# Patient Record
Sex: Female | Born: 1970 | Race: White | Hispanic: No | Marital: Married | Smoking: Current every day smoker
Health system: Southern US, Community
[De-identification: ages and names within clinical notes are randomized; demographics above are authoritative.]

## PROBLEM LIST (undated history)

## (undated) DIAGNOSIS — T7840XA Allergy, unspecified, initial encounter: Secondary | ICD-10-CM

## (undated) DIAGNOSIS — K8020 Calculus of gallbladder without cholecystitis without obstruction: Secondary | ICD-10-CM

## (undated) DIAGNOSIS — K219 Gastro-esophageal reflux disease without esophagitis: Secondary | ICD-10-CM

## (undated) DIAGNOSIS — F419 Anxiety disorder, unspecified: Secondary | ICD-10-CM

## (undated) DIAGNOSIS — F329 Major depressive disorder, single episode, unspecified: Secondary | ICD-10-CM

## (undated) DIAGNOSIS — K589 Irritable bowel syndrome without diarrhea: Secondary | ICD-10-CM

## (undated) DIAGNOSIS — C801 Malignant (primary) neoplasm, unspecified: Secondary | ICD-10-CM

## (undated) DIAGNOSIS — G35 Multiple sclerosis: Secondary | ICD-10-CM

## (undated) DIAGNOSIS — F32A Depression, unspecified: Secondary | ICD-10-CM

## (undated) DIAGNOSIS — G43909 Migraine, unspecified, not intractable, without status migrainosus: Secondary | ICD-10-CM

## (undated) HISTORY — PX: TUBAL LIGATION: SHX77

## (undated) HISTORY — DX: Major depressive disorder, single episode, unspecified: F32.9

## (undated) HISTORY — PX: BREAST BIOPSY: SHX20

## (undated) HISTORY — DX: Depression, unspecified: F32.A

## (undated) HISTORY — DX: Gastro-esophageal reflux disease without esophagitis: K21.9

## (undated) HISTORY — DX: Anxiety disorder, unspecified: F41.9

## (undated) HISTORY — DX: Allergy, unspecified, initial encounter: T78.40XA

## (undated) HISTORY — DX: Migraine, unspecified, not intractable, without status migrainosus: G43.909

## (undated) HISTORY — DX: Irritable bowel syndrome, unspecified: K58.9

## (undated) HISTORY — DX: Malignant (primary) neoplasm, unspecified: C80.1

## (undated) HISTORY — PX: NOVASURE ABLATION: SHX5394

---

## 2000-01-01 ENCOUNTER — Other Ambulatory Visit: Admission: RE | Admit: 2000-01-01 | Discharge: 2000-01-01 | Payer: Self-pay | Admitting: Obstetrics & Gynecology

## 2000-05-14 ENCOUNTER — Encounter: Admission: RE | Admit: 2000-05-14 | Discharge: 2000-08-12 | Payer: Self-pay | Admitting: Obstetrics and Gynecology

## 2000-07-15 ENCOUNTER — Inpatient Hospital Stay (HOSPITAL_COMMUNITY): Admission: AD | Admit: 2000-07-15 | Discharge: 2000-07-17 | Payer: Self-pay | Admitting: Obstetrics & Gynecology

## 2000-08-14 ENCOUNTER — Other Ambulatory Visit: Admission: RE | Admit: 2000-08-14 | Discharge: 2000-08-14 | Payer: Self-pay | Admitting: Obstetrics & Gynecology

## 2000-08-19 ENCOUNTER — Ambulatory Visit (HOSPITAL_COMMUNITY): Admission: RE | Admit: 2000-08-19 | Discharge: 2000-08-19 | Payer: Self-pay | Admitting: Obstetrics & Gynecology

## 2002-06-26 ENCOUNTER — Encounter: Admission: RE | Admit: 2002-06-26 | Discharge: 2002-06-26 | Payer: Self-pay

## 2002-09-11 ENCOUNTER — Other Ambulatory Visit: Admission: RE | Admit: 2002-09-11 | Discharge: 2002-09-11 | Payer: Self-pay | Admitting: Obstetrics & Gynecology

## 2003-11-03 ENCOUNTER — Other Ambulatory Visit: Admission: RE | Admit: 2003-11-03 | Discharge: 2003-11-03 | Payer: Self-pay | Admitting: Obstetrics & Gynecology

## 2004-03-08 ENCOUNTER — Encounter: Admission: RE | Admit: 2004-03-08 | Discharge: 2004-03-08 | Payer: Self-pay | Admitting: Obstetrics and Gynecology

## 2005-04-12 ENCOUNTER — Ambulatory Visit (HOSPITAL_COMMUNITY): Admission: RE | Admit: 2005-04-12 | Discharge: 2005-04-12 | Payer: Self-pay | Admitting: Obstetrics & Gynecology

## 2005-04-12 ENCOUNTER — Encounter (INDEPENDENT_AMBULATORY_CARE_PROVIDER_SITE_OTHER): Payer: Self-pay | Admitting: *Deleted

## 2005-10-16 ENCOUNTER — Ambulatory Visit (HOSPITAL_COMMUNITY): Admission: RE | Admit: 2005-10-16 | Discharge: 2005-10-16 | Payer: Self-pay | Admitting: Gastroenterology

## 2007-07-18 ENCOUNTER — Encounter: Admission: RE | Admit: 2007-07-18 | Discharge: 2007-07-18 | Payer: Self-pay | Admitting: Family Medicine

## 2011-02-16 ENCOUNTER — Other Ambulatory Visit: Payer: Self-pay

## 2011-02-16 ENCOUNTER — Ambulatory Visit
Admission: RE | Admit: 2011-02-16 | Discharge: 2011-02-16 | Disposition: A | Discharge status: Home / Self Care | Payer: 59 | Source: Ambulatory Visit | Attending: Family Medicine | Admitting: Family Medicine

## 2011-02-16 ENCOUNTER — Other Ambulatory Visit: Payer: Self-pay | Admitting: Family Medicine

## 2011-02-16 DIAGNOSIS — R202 Paresthesia of skin: Secondary | ICD-10-CM

## 2011-02-19 ENCOUNTER — Other Ambulatory Visit: Payer: Self-pay

## 2011-04-13 NOTE — H&P (Signed)
[facility]  Patient:    Amy Charles, Amy Charles                        MRN: [id number] Adm. Date:  [id number] Attending:  Silverio Lay A                         History and Physical  DATE OF BIRTH:                Oct 15, 1971.                                Amy Charles is a 40 year old woman, G3, P1, A1, last menstrual period October 13, 1999, for an estimated delivery date on July 21, 2000.  Ultrasound corresponded with last menstrual period dating at 39-weeks and 1-day gestation.  REASON FOR ADMISSION:         Induction for PIH.  HISTORY OF PRESENT ILLNESS:   Patient presented for her office visit today and had a blood pressure of 160/90.  It was repeated and stayed in the same range. She had mild headache, possibly associated with congestion.  No visual symptoms, no epigastric pain and mild lower limb edema.  Fetal movements were positive.  No vaginal bleeding.  No fluid leak.  Rare uterine contractions. Her blood pressures last week were 140/80 and then when rechecked, 125/65. Urine was negative for protein.  In her first pregnancy, she had mild PIH, did not need any medication and no magnesium sulfate was used.  PAST MEDICAL HISTORY:         Negative.  PAST SURGICAL HISTORY:        Negative.  PAST OBSTETRICAL HISTORY:     Twelve-week therapeutic abortion, no complication, 1989.  August 1998, 38-week induction for large for gestational age, spontaneous vaginal delivery; baby was 8 pounds and 9 ounces.  She had mild PIH.  No medication used.  No magnesium sulfate use.  No complications.  ALLERGIES:                    No known drug allergies.  MEDICATIONS:                  Prenate vitamins.  SOCIAL HISTORY:               Married.  Nonsmoker.  HISTORY OF PRESENT ILLNESS:   First trimester was normal.  She had hemoglobin of 13.4, platelets 250,000.  Blood type and Rh:  O-positive, antibodies negative.  RPR nonreactive.  HBsAg negative.   Rubella immune.  In her second trimester, she had a triple test, within normal limits.  Ultrasound at 20+ weeks showed normal review of anatomy, no change in dating, normal fluid, normal placenta.  Synechiae were present.  An ultrasound was repeated at 31+ weeks and showed that it was resolved.  Estimated fetal weight at that time was 73rd percentile, normal amniotic fluid.  She has a history of cryotherapy.  Her cervix was checked every visit and was slightly shorter but reassuring.  Her one-hour GTT was abnormal.  Her three-hour GTT was abnormal. She had gestational diabetes, type A-1, well-controlled.  Group B strep was done at 36 weeks and came back negative.  Blood pressure:  See HPI.  REVIEW OF SYSTEMS:            CONSTITUTIONAL:  Negative.  HEENT:  Mild congestion.  RESPIRATORY:  Negative.  CARDIOVASCULAR:  Negative.  GI: Negative.  UROLOGIC:  Negative.  NEUROLOGIC:  Mild headache, probably associated with congestion.  DERMATOLOGIC:  Negative.  PHYSICAL EXAMINATION  GENERAL:                      No apparent distress.  VITAL SIGNS:                  Blood pressure at the office was 160/90.  Blood pressure here came down to 133/70.  Pulse 94.  Respiratory rate 20. Temperature 99.9.  HEENT:                        Within normal limits.  LUNGS:                        Clear bilaterally.  HEART:                        S1 and S2 normal.  Regular cardiac rhythm.  No murmur.  ABDOMEN:                      Gravid.  Uterine height 38 cm.  Vertex presentation.  PELVIC:                       Vaginal exam:  Three-centimeters dilated, 70% effaced, vertex -1, membranes intact.  EXTREMITIES:                  Lower limbs:  Mild edema.  DTRs 2/4.  No clonus.  LABORATORY DATA:              Fasting blood sugar is below 80; postprandial below 90.  MONITORING:                   Fetal heart rate baseline 150 per minute, accelerations positive, no deceleration, reactive NST.  No regular  uterine contractions.  IMPRESSION:                   Gravida 3, para 1, abortus 1, 39-weeks and 1-day gestation with pregnancy-induced hypertension, no evidence of preeclampsia, fetal well-being reassuring, group B streptococcus negative.  PLAN:                         Admit to labor and delivery, induction with artificial rupture of membranes and low-dose Pitocin.  Follow for probable vaginal delivery.  Monitoring. DD:  07/15/00 TD:  07/15/00 Job: [id number]

## 2011-04-13 NOTE — Op Note (Signed)
[facility]  Patient:    Amy Charles, Amy Charles                        MRN: [id number] Proc. Date: 08/19/00 Adm. Date:  [id number] Disc. Date: [id number] Attending:  Silverio Lay A                           Operative Report  PREOPERATIVE DIAGNOSIS:       Desired tubal sterilization.  POSTOPERATIVE DIAGNOSIS:      Desired tubal sterilization.  INTERVENTION:                 Bilateral tubal sterilization with aliquot clips                               by laparoscopy.  SURGEON:                      Genia Del, M.D.  ANESTHESIOLOGIST:             Belva Agee, M.D.  PROCEDURE:                    Under general anesthesia with endotracheal intubation, the patient was placed in the lithotomy position.  She is prepped with Betadine at the abdominal suprapubic vulvar and vaginal area.  The bladder is emptied with a catheter, and the patient is draped as usual.  The vaginal examination revealed an anteverted uterus, normal volume.  No adnexal mass.  The cervix is normal.  The speculum is inserted and the uterus is cannulated, and then the speculum was removed.  Abdominally an infraumbilical incision is made with a scalpel over 10 mm.  The Veress needle is inserted while raising the abdominal wall.  The security tests are done and a pneumoperitoneum is created using 4 L of CO2.  Once achieved, the needle is removed.  The trocars are inserted with camera.  Inspection of the pelvic cavity reveals a normal uterus and volume in appearance, two normal ovaries and two normal tubes.  No lesion is seen in the pelvis.  The abdominal inspection is negative as well.  We used the Hulka clips at about 2 cm from the cornua on each side, and ______ clip is applied, clamping the old diameter of the tube on each side.  Verification of application is then done, and is adequate.  We took pictures before the application of Hulka clips and after.  Hemostasis is adequate.   The instruments were therefore removed.  The CO2 is evacuated. The ______ is closed at the infraumbilical incision with 0 Vicryl, and Marcaine 0.25%  is injected subcutaneously (4 cc).  The skin is then closed with 4-0 Vicryl.  The instruments vaginally are also removed.  ESTIMATED BLOOD LOSS:         Minimal.  COMPLICATIONS:                None.  DISPOSITION:                  The patient was transferred to the recovery room in good condition. DD:  08/19/00 TD:  08/19/00 Job: [id number]

## 2011-04-13 NOTE — Op Note (Signed)
Amy Charles, Amy Charles                 ACCOUNT NO.:  [id number]   MEDICAL RECORD NO.:  [id number]          PATIENT TYPE:  AMB   LOCATION:  SDC                           FACILITY:  [facility]   PHYSICIAN:  Genia Del, M.D.DATE OF BIRTH:  Jul 10, 1971   DATE OF PROCEDURE:  04/12/2005  DATE OF DISCHARGE:                                 OPERATIVE REPORT   PREOPERATIVE DIAGNOSIS:  Menometrorrhagia.   POSTOPERATIVE DIAGNOSIS:  Menometrorrhagia.   PROCEDURES:  1.  Dilatation and curettage.  2.  NovaSure endometrial ablation.   SURGEON:  Genia Del, M.D.   ANESTHESIOLOGIST:  Octaviano Glow. Pamalee Leyden, M.D.   PROCEDURE:  Under MAC analgesia, the patient is in lithotomy position.  She  is prepped with Betadine on the suprapubic, vulvar and vaginal areas, the  bladder is catheterized and the patient is draped as usual.  The vaginal  exam reveals an anteverted uterus, normal volume, no adnexal mass.  The  cervix is long and closed.  The patient is on day 2 of her menses with  moderate flow.  The speculum is inserted in the vagina.  The anterior lip of  the cervix is grasped with a tenaculum.  We start by doing a hysterometry,  which reveals an intrauterine cavity at 7 cm, the length of the cervix is 3  cm, so the uterine cavity only is 4 cm.  Note that the paracervical block  was done with Nesacaine 1% 20 mL at 4 and 8 o'clock.  We then proceed with  dilatation of the cervix to Hegar dilator #17 easily.  We proceed with a  systematic endometrial curettage because the patient is menstruating.  The  endometrial curettings are sent to pathology.  A sharp curette was used for  that.  We then take the NovaSure instrument.  We insert it in the  intrauterine cavity, proceed with a width measurement, which reveals the  width of the cavity at 3.7 cm.  We then verify the integrity of the  intrauterine cavity.  The test is passed, confirmed by a green light.  We  then proceed with the endometrial ablation,  which lasts 1 minute 17 seconds  at a power of 81.  The instrument is then removed.  The tenaculum is also  removed as well as the speculum after confirming good hemostasis.  The  estimated blood loss was minimal, no complication occurred.  The patient  tolerated the procedure very well, and she was brought to recovery room in  good, stable status.      ML/MEDQ  D:  04/12/2005  T:  04/12/2005  Job:  [id number]

## 2012-03-13 ENCOUNTER — Encounter: Payer: Self-pay | Admitting: Neurology

## 2012-03-13 ENCOUNTER — Ambulatory Visit (INDEPENDENT_AMBULATORY_CARE_PROVIDER_SITE_OTHER): Payer: 59 | Admitting: Neurology

## 2012-03-13 ENCOUNTER — Other Ambulatory Visit: Payer: Self-pay | Admitting: Neurology

## 2012-03-13 ENCOUNTER — Other Ambulatory Visit (INDEPENDENT_AMBULATORY_CARE_PROVIDER_SITE_OTHER): Payer: 59

## 2012-03-13 VITALS — BP 122/72 | HR 104 | Wt 173.0 lb

## 2012-03-13 DIAGNOSIS — G609 Hereditary and idiopathic neuropathy, unspecified: Secondary | ICD-10-CM

## 2012-03-13 LAB — COMPREHENSIVE METABOLIC PANEL
Alkaline Phosphatase: 57 U/L (ref 39–117)
BUN: 12 mg/dL (ref 6–23)
Creatinine, Ser: 0.6 mg/dL (ref 0.4–1.2)
GFR: 113.19 mL/min (ref >60.00)
Glucose, Bld: 102 mg/dL — ABNORMAL HIGH (ref 70–99)
Sodium: 138 mEq/L (ref 135–145)
Total Bilirubin: 0.4 mg/dL (ref 0.3–1.2)
Total Protein: 7.2 g/dL (ref 6.0–8.3)

## 2012-03-13 LAB — CBC WITH DIFFERENTIAL/PLATELET
Eosinophils Relative: 4.1 % (ref 0.0–5.0)
HCT: 42.7 % (ref 36.0–46.0)
Hemoglobin: 14.6 g/dL (ref 12.0–15.0)
Lymphs Abs: 2.5 10*3/uL (ref 0.7–4.0)
MCV: 91.6 fl (ref 78.0–100.0)
Monocytes Absolute: 0.5 10*3/uL (ref 0.1–1.0)
Neutro Abs: 4.5 10*3/uL (ref 1.4–7.7)
Platelets: 292 10*3/uL (ref 150.0–400.0)
WBC: 7.9 10*3/uL (ref 4.5–10.5)

## 2012-03-13 LAB — SEDIMENTATION RATE: Sed Rate: 9 mm/hr (ref 0–22)

## 2012-03-13 LAB — VITAMIN B12: Vitamin B-12: 1107 pg/mL — ABNORMAL HIGH (ref 211–911)

## 2012-03-13 LAB — HEMOGLOBIN A1C: Hgb A1c MFr Bld: 5.7 % (ref 4.6–6.5)

## 2012-03-13 LAB — TSH: TSH: 0.94 u[IU]/mL (ref 0.35–5.50)

## 2012-03-13 MED ORDER — RIZATRIPTAN BENZOATE 10 MG PO TABS: 10.0000 mg | ORAL_TABLET | Freq: Once | ORAL | Status: DC | PRN

## 2012-03-13 NOTE — Patient Instructions (Addendum)
Go to the basement to have your labs drawn today.  We will see you back on June 21st at 10:00am.

## 2012-03-13 NOTE — Progress Notes (Signed)
Dear Dr. Nathanial Rancher,  Thank you for having me see Amy Charles in consultation today at [facility] for her problem with migraines as well as numbness and tingling in her hands and feet.  As you may recall, she is a 41 y.o. year old female with a history of headaches for the last 10 years.  These occur as often as every two days.  They are described as starting in the back of the head and radiating forward, with photophobia and phonophobia and sometimes nausea.  She can get tunnel vision with these headaches that occur at the same time as the headaches.  She was alarmed because she had a headache that lasted 3 weeks which is atypical, but has happened before.  They usually last a day.  She is taking excedrin or ibuprofen for them which attenuates the headache but does not get rid of it.  She tried to stop all her medications when she had her last prolonged headache including her pain medications and it did not attenuated it.    She has been on Topamax before at 50 bid, which greatly attenuated her headaches.  She was recently started by your self on this medication and she is increasing this to 50 bid.  She tried Imitrex but this caused "tremoring of her body for 1 hour".  She also has a history of a "right foot drop" about 10 years ago.  Apparently someone was concerned for MS.  She said that the right foot drop lasted about 6 weeks but she was left with residual tingling and burning in her 4 extremities.  She had a recent MRI had some rare periventricular lesions not consistent with MS.  She did have a recent NCS which is not available to [place], but apparently was done at [facility].  She feels that the burning and tingling wax and wane in intensity.  It has not gotten worse recently.  Past Medical History  Diagnosis Date  . Migraines   - ?foot drop 10 years ago. - ADHD  Past Surgical History  Procedure Date  . Novasure ablation   . Tubal ligation   . Breast biopsy     History   Social  History  . Marital Status: Married    Spouse Name: N/A    Number of Children: N/A  . Years of Education: N/A   Social History Main Topics  . Smoking status: Current Everyday Smoker -- 0.5 packs/day    Types: Cigarettes  . Smokeless tobacco: Never Used  . Alcohol Use: No  . Drug Use: None  . Sexually Active: None   Other Topics Concern  . None   Social History Narrative  . None  - drinks 3-4 caffinated drinks per day  FamHx:  No migraine in family, no neuropathies.  Current Outpatient Prescriptions on File Prior to Visit  Medication Sig Dispense Refill  . amphetamine-dextroamphetamine (ADDERALL) 20 MG tablet Take 20 mg by mouth 2 (two) times daily.      Marland Kitchen esomeprazole (NEXIUM) 20 MG capsule Take 20 mg by mouth daily before breakfast.      . levocetirizine (XYZAL) 5 MG tablet Take 5 mg by mouth. q am      . promethazine (PHENERGAN) 25 MG tablet Take 25 mg by mouth every 6 (six) hours as needed.      . topiramate (TOPAMAX) 25 MG tablet Take 25 mg by mouth 2 (two) times daily.        No Known Allergies  ROS:  13 systems were reviewed and are notable for anxiety, depression, fainting spells, headaches, memory problems, disorientation, difficulty with speech, inability to concentrate, double or blurred vision, difficulty with balance.  All other review of systems are unremarkable.   Examination:  Filed Vitals:   03/13/12 0823  BP: 122/72  Pulse: 104  Weight: 173 lb (78.472 kg)     In general, slightly dysphoric appearing women.  H&N:  left occipital Tinel's  Cardiovascular: The patient has a regular rate and rhythm and no carotid bruits.  Fundoscopy:  Disks are flat. Vessel caliber within normal limits.  Normal SVP.  Mental status:   The patient is oriented to person, place and time. Recent and remote memory are intact. Attention span and concentration are normal. Language including repetition, naming, following commands are intact. Fund of knowledge of current  and historical events, as well as vocabulary are normal.  Cranial Nerves: Pupils are equally round and reactive to light. Visual fields full to confrontation. Extraocular movements are intact without nystagmus. Facial sensation and muscles of mastication are intact. Muscles of facial expression are symmetric. Hearing intact to bilateral finger rub. Tongue protrusion, uvula, palate midline.  Shoulder shrug intact  Motor:  The patient has normal bulk and tone, no pronator drift.  There are no adventitious movements.  5/5 muscle strength bilaterally except for 4+ at HF, although there is giveaway so she may be full strength.  Reflexes:  1+ UE, 2+ patella, 1+ ankles.  Toes down  Coordination:  Normal finger to nose.  No dysdiadokinesia.  Sensation is decreased to temperature in bilateral leg, arms, to mid calf, mid forearm.  Vibration absent at toes, R-S 3 at fingers, position sense  absent in feet.  Gait and Station are normal.  Romberg is positive.  MRI brain was reviewed and mild periventricular lesions are seen that are not impressive for typical MS lesions.   Impression/Recs: 1.  Migraines - migraine with aura, there may be a contribution of caffeine overuse and medication overuse.  I agree with restarting the Topamax to 50 bid.  This can be increased to 100 bid if necessary.  I have given her Maxalt 10mg  which tends to be better tolerated than Imitrex.  She may have an element of occipital neuralgia, although I don't think this is the primary problem at this time. 2.  Peripheral neuropathy - She has quite a severe peripheral neuropathy for her age.  However, I think there is a degree of functional overlay here.  In any case I am going to get the NCS that was done in the last year at [facility].  If this is not available we will need to repeat this.  I am also going to send of PN labs.     We will see the patient back in 2 months.  Thank you for having [place] see Amy Charles in consultation.   Feel free to contact me with any questions.  Lupita Raider Modesto Charon, MD [facility], [place] [address] [place], [place] [address] Phone: [phone] Fax: [phone].

## 2012-03-17 LAB — SPEP & IFE WITH QIG
Albumin ELP: 64.9 % (ref 55.8–66.1)
Alpha-1-Globulin: 4.5 % (ref 2.9–4.9)
Beta 2: 4.3 % (ref 3.2–6.5)
Beta Globulin: 5.5 % (ref 4.7–7.2)
Total Protein, Serum Electrophoresis: 6.8 g/dL (ref 6.0–8.3)

## 2012-03-17 LAB — METHYLMALONIC ACID, SERUM: Methylmalonic Acid, Quant: 0.13 umol/L (ref <0.40)

## 2012-03-24 ENCOUNTER — Telehealth: Payer: Self-pay | Admitting: Neurology

## 2012-03-24 NOTE — Telephone Encounter (Signed)
Let her know that labs looked ok.

## 2012-03-24 NOTE — Telephone Encounter (Signed)
Pt called for lab results. Call back at [phone] today.

## 2012-03-25 NOTE — Telephone Encounter (Signed)
Called and spoke with the patient at her work number. Informed labs normal. No other questions or concerns voiced at this time.

## 2012-05-16 ENCOUNTER — Encounter: Payer: Self-pay | Admitting: Neurology

## 2012-05-16 ENCOUNTER — Ambulatory Visit (INDEPENDENT_AMBULATORY_CARE_PROVIDER_SITE_OTHER): Payer: 59 | Admitting: Neurology

## 2012-05-16 VITALS — BP 126/78 | HR 96 | Wt 168.0 lb

## 2012-05-16 DIAGNOSIS — G35 Multiple sclerosis: Secondary | ICD-10-CM

## 2012-05-16 DIAGNOSIS — R51 Headache: Secondary | ICD-10-CM

## 2012-05-16 DIAGNOSIS — R519 Headache, unspecified: Secondary | ICD-10-CM

## 2012-05-16 MED ORDER — DEXAMETHASONE 2 MG PO TABS: ORAL_TABLET | ORAL | Status: AC

## 2012-05-16 MED ORDER — TOPIRAMATE 25 MG PO TABS: 25.0000 mg | ORAL_TABLET | Freq: Two times a day (BID) | ORAL | Status: DC

## 2012-05-16 MED ORDER — RIZATRIPTAN BENZOATE 10 MG PO TABS: 10.0000 mg | ORAL_TABLET | Freq: Once | ORAL | Status: DC | PRN

## 2012-05-16 NOTE — Progress Notes (Signed)
Dear Dr. Nathanial Rancher,   Thank you for having me see Early Chars in follow up today at [facility] for her problem with migraines as well as numbness and tingling in her hands and feet. As you may recall, she is a 41 y.o. year old female with a history of headaches for the last 10 years. These occur as often as every two days. They are described as starting in the back of the head and radiating forward, with photophobia and phonophobia and sometimes nausea. She can get tunnel vision with these headaches that occur at the same time as the headaches.  She was alarmed because she had a headache that lasted 3 weeks which is atypical, but has happened before. They usually last a day. She is taking excedrin or ibuprofen for them which attenuates the headache but does not get rid of it. She tried to stop all her medications when she had her last prolonged headache including her pain medications and it did not attenuated it.  She has been on Topamax before at 50 bid, which greatly attenuated her headaches. She was recently started by your self on this medication and she is increasing this to 50 bid. She tried Imitrex but this caused "tremoring of her body for 1 hour".  She also has a history of a "right foot drop" about 10 years ago. Apparently someone was concerned for MS. She said that the right foot drop lasted about 6 weeks but she was left with residual tingling and burning in her 4 extremities. She had a recent MRI had some rare periventricular lesions not consistent with MS. She did have a recent NCS which is not available to [place], but apparently was done at [facility]. She feels that the burning and tingling wax and wane in intensity. It has not gotten worse recently.  ---------------------------------  At her first visit as outlined above I decided to continue her Topamax at 50 bid.  I gave her Maxalt for her severe headaches.  I ordered PN labs which were all normal. I unfortunately did not get a copy of her  NCS/EMG.  She does reported that they told her at [facility] that her NCS had nothing to worry about.  Since I last saw her she has not noted any improvement in her headaches.  She is getting headaches every day.  She now is using ibuprofen or Goody's powder every day.  She has tried the Maxalt and has used it about 20 times over the last 2 months. She is also using tramadol at work.  She says she gets severe headaches about 2-3 times per [facility].  Her tingling in her arms and legs has not changed.  Her walking is unchanged.   Medical history, social history, and family history were reviewed and have not changed since the last clinic visit.  Current Outpatient Prescriptions on File Prior to Visit  Medication Sig Dispense Refill  . alprazolam (XANAX) 2 MG tablet Take 2 mg by mouth. Bid as needed      . amphetamine-dextroamphetamine (ADDERALL) 20 MG tablet Take 20 mg by mouth 2 (two) times daily.      Marland Kitchen esomeprazole (NEXIUM) 20 MG capsule Take 20 mg by mouth daily before breakfast.      . levocetirizine (XYZAL) 5 MG tablet Take 5 mg by mouth. q am      . promethazine (PHENERGAN) 25 MG tablet Take 25 mg by mouth every 6 (six) hours as needed.      Marland Kitchen  traMADol (ULTRAM) 50 MG tablet Take 50 mg by mouth every 6 (six) hours as needed.      Marland Kitchen DISCONTD: rizatriptan (MAXALT) 10 MG tablet Take 1 tablet (10 mg total) by mouth once as needed for migraine. May repeat in 2 hours if needed  10 tablet  2  . DISCONTD: topiramate (TOPAMAX) 25 MG tablet Take 25 mg by mouth 2 (two) times daily.        Allergies  Allergen Reactions  . Sumatriptan     convulsions    ROS:  13 systems were reviewed and  are unremarkable.  Exam: . Filed Vitals:   05/16/12 0955  BP: 126/78  Pulse: 96  Weight: 168 lb (76.204 kg)    In general, well appearing women.  Mental status:   The patient is oriented to person, place and time. Recent and remote memory are intact. Attention span and concentration are normal. Language  including repetition, naming, following commands are intact. Fund of knowledge of current and historical events, as well as vocabulary are normal.  Cranial Nerves: Pupils are equally round and reactive to light. Visual fields full to confrontation. Extraocular movements are intact without nystagmus. Facial sensation and muscles of mastication are intact. Muscles of facial expression are symmetric. Hearing intact to bilateral finger rub. Tongue protrusion, uvula, palate midline.  Shoulder shrug intact  Motor:  Normal bulk and tone, no drift and 5/5 muscle strength bilaterally.  Giveaway weakness in RLE.  Reflexes:  1+ UE, absent lowerst, toes down.  Sensory:  Length dep loss of temperature, decreased but present vibration, normal position in LE.   Gait:  Normal gait and station.  Romberg negative.  Impression/Recommendations:  1.  CDH/Migraine - I am going to increase her Topamax to 75 bid.  I think her headache frequency is due medication overuse - she is using ibuprofen and Goody's almost every day.  I have given her a decadron taper and she is going to stop all her PRN medications for at least two weeks.  I think her headache freq will improve.   2.  ?Peripheral neuropathy - This hasn't changed. PN labs are normal.  I think this can be just monitored clinically. 3.  RLE weakness - no definite weakness here on exam.  Because of the previous question of abnormal white matter lesions I am going to repeat an MRI brain to make sure there has been no change.  I don't expect any though.    Lupita Raider Modesto Charon, MD [facility], [place]

## 2012-05-16 NOTE — Patient Instructions (Addendum)
Your MRI is scheduled for Tuesday, June 25th at 4:00pm  Please arrive to [facility], first floor admitting by 3:45pm.  [phone]. Marland Kitchen

## 2012-05-18 DIAGNOSIS — R519 Headache, unspecified: Secondary | ICD-10-CM | POA: Insufficient documentation

## 2012-05-20 ENCOUNTER — Ambulatory Visit (HOSPITAL_COMMUNITY)
Admission: RE | Admit: 2012-05-20 | Discharge: 2012-05-20 | Disposition: A | Discharge status: Home / Self Care | Payer: 59 | Source: Ambulatory Visit | Attending: Neurology | Admitting: Neurology

## 2012-05-20 DIAGNOSIS — R209 Unspecified disturbances of skin sensation: Secondary | ICD-10-CM | POA: Insufficient documentation

## 2012-05-20 DIAGNOSIS — G35 Multiple sclerosis: Secondary | ICD-10-CM

## 2012-05-20 DIAGNOSIS — R51 Headache: Secondary | ICD-10-CM | POA: Insufficient documentation

## 2012-05-20 MED ORDER — GADOBENATE DIMEGLUMINE 529 MG/ML IV SOLN
17.0000 mL | Freq: Once | INTRAVENOUS | Status: AC
  Administered 2012-05-20: 17 mL via INTRAVENOUS

## 2012-05-21 ENCOUNTER — Telehealth: Payer: Self-pay | Admitting: Neurology

## 2012-05-21 NOTE — Telephone Encounter (Signed)
Spoke with [place]. Information given as per Dr. Modesto Charon below. Patient voiced no additional concerns at this time.

## 2012-05-21 NOTE — Telephone Encounter (Signed)
Left a message with her office asking that she call me back.

## 2012-05-21 NOTE — Telephone Encounter (Signed)
Message copied by Benay Spice on Wed May 21, 2012 12:39 PM ------      Message from: Denton Meek H      Created: Wed May 21, 2012 10:41 AM       Let Sander Radon know her brain MRI is unchanged since last time.

## 2012-06-16 NOTE — Progress Notes (Signed)
Got copies of Ms. Amy Charles NCS done at [facility] 03/21/2011.  NCS of both lower extremities were normal as was RUE

## 2012-08-19 ENCOUNTER — Ambulatory Visit: Payer: 59 | Attending: Neurology

## 2012-08-19 DIAGNOSIS — IMO0001 Reserved for inherently not codable concepts without codable children: Secondary | ICD-10-CM | POA: Insufficient documentation

## 2012-08-19 DIAGNOSIS — M2550 Pain in unspecified joint: Secondary | ICD-10-CM | POA: Insufficient documentation

## 2012-08-19 DIAGNOSIS — M6281 Muscle weakness (generalized): Secondary | ICD-10-CM | POA: Insufficient documentation

## 2012-08-19 DIAGNOSIS — R293 Abnormal posture: Secondary | ICD-10-CM | POA: Insufficient documentation

## 2012-08-26 ENCOUNTER — Ambulatory Visit: Payer: 59 | Attending: Neurology | Admitting: Rehabilitation

## 2012-08-26 DIAGNOSIS — M6281 Muscle weakness (generalized): Secondary | ICD-10-CM | POA: Insufficient documentation

## 2012-08-26 DIAGNOSIS — M2550 Pain in unspecified joint: Secondary | ICD-10-CM | POA: Insufficient documentation

## 2012-08-26 DIAGNOSIS — R293 Abnormal posture: Secondary | ICD-10-CM | POA: Insufficient documentation

## 2012-08-26 DIAGNOSIS — IMO0001 Reserved for inherently not codable concepts without codable children: Secondary | ICD-10-CM | POA: Insufficient documentation

## 2012-08-29 ENCOUNTER — Encounter: Payer: 59 | Admitting: Rehabilitation

## 2012-09-02 ENCOUNTER — Encounter: Payer: 59 | Admitting: Rehabilitation

## 2012-09-04 ENCOUNTER — Encounter: Payer: 59 | Admitting: Rehabilitation

## 2013-02-16 ENCOUNTER — Emergency Department: Payer: Self-pay | Admitting: Unknown Physician Specialty

## 2013-03-02 ENCOUNTER — Emergency Department: Payer: Self-pay | Admitting: Emergency Medicine

## 2014-07-19 ENCOUNTER — Other Ambulatory Visit: Payer: Self-pay | Admitting: Family Medicine

## 2014-07-19 DIAGNOSIS — N63 Unspecified lump in unspecified breast: Secondary | ICD-10-CM

## 2014-07-22 ENCOUNTER — Ambulatory Visit
Admission: RE | Admit: 2014-07-22 | Discharge: 2014-07-22 | Disposition: A | Discharge status: Home / Self Care | Payer: 59 | Source: Ambulatory Visit | Attending: Family Medicine | Admitting: Family Medicine

## 2014-07-22 ENCOUNTER — Encounter (INDEPENDENT_AMBULATORY_CARE_PROVIDER_SITE_OTHER): Payer: Self-pay

## 2014-07-22 DIAGNOSIS — N63 Unspecified lump in unspecified breast: Secondary | ICD-10-CM

## 2014-10-07 ENCOUNTER — Other Ambulatory Visit: Payer: Self-pay | Admitting: Family Medicine

## 2014-10-07 ENCOUNTER — Ambulatory Visit
Admission: RE | Admit: 2014-10-07 | Discharge: 2014-10-07 | Disposition: A | Discharge status: Home / Self Care | Payer: 59 | Source: Ambulatory Visit | Attending: Family Medicine | Admitting: Family Medicine

## 2014-10-07 DIAGNOSIS — R1011 Right upper quadrant pain: Secondary | ICD-10-CM

## 2014-10-07 DIAGNOSIS — R112 Nausea with vomiting, unspecified: Secondary | ICD-10-CM

## 2017-12-17 ENCOUNTER — Emergency Department: Payer: 59

## 2017-12-17 ENCOUNTER — Encounter: Payer: Self-pay | Admitting: Emergency Medicine

## 2017-12-17 ENCOUNTER — Emergency Department
Admission: EM | Admit: 2017-12-17 | Discharge: 2017-12-18 | Disposition: A | Discharge status: Home / Self Care | Payer: 59 | Attending: Emergency Medicine | Admitting: Emergency Medicine

## 2017-12-17 DIAGNOSIS — R109 Unspecified abdominal pain: Secondary | ICD-10-CM

## 2017-12-17 DIAGNOSIS — F17210 Nicotine dependence, cigarettes, uncomplicated: Secondary | ICD-10-CM | POA: Insufficient documentation

## 2017-12-17 DIAGNOSIS — Z79899 Other long term (current) drug therapy: Secondary | ICD-10-CM | POA: Diagnosis not present

## 2017-12-17 DIAGNOSIS — K8020 Calculus of gallbladder without cholecystitis without obstruction: Secondary | ICD-10-CM

## 2017-12-17 DIAGNOSIS — K8050 Calculus of bile duct without cholangitis or cholecystitis without obstruction: Secondary | ICD-10-CM

## 2017-12-17 HISTORY — DX: Calculus of gallbladder without cholecystitis without obstruction: K80.20

## 2017-12-17 LAB — COMPREHENSIVE METABOLIC PANEL
ALK PHOS: 53 U/L (ref 38–126)
ALT: 16 U/L (ref 14–54)
AST: 20 U/L (ref 15–41)
Albumin: 4.4 g/dL (ref 3.5–5.0)
Anion gap: 10 (ref 5–15)
BILIRUBIN TOTAL: 0.4 mg/dL (ref 0.3–1.2)
BUN: 11 mg/dL (ref 6–20)
CALCIUM: 8.8 mg/dL — AB (ref 8.9–10.3)
CO2: 23 mmol/L (ref 22–32)
Chloride: 105 mmol/L (ref 101–111)
Creatinine, Ser: 0.69 mg/dL (ref 0.44–1.00)
GFR calc Af Amer: >60 mL/min (ref >60)
Glucose, Bld: 90 mg/dL (ref 65–99)
POTASSIUM: 3.9 mmol/L (ref 3.5–5.1)
Sodium: 138 mmol/L (ref 135–145)
TOTAL PROTEIN: 7.3 g/dL (ref 6.5–8.1)

## 2017-12-17 LAB — CBC
HEMATOCRIT: 43.1 % (ref 35.0–47.0)
Hemoglobin: 15 g/dL (ref 12.0–16.0)
MCH: 31.5 pg (ref 26.0–34.0)
MCHC: 34.9 g/dL (ref 32.0–36.0)
MCV: 90.4 fL (ref 80.0–100.0)
PLATELETS: 254 10*3/uL (ref 150–440)
RBC: 4.77 MIL/uL (ref 3.80–5.20)
RDW: 13.3 % (ref 11.5–14.5)
WBC: 9 10*3/uL (ref 3.6–11.0)

## 2017-12-17 LAB — TROPONIN I

## 2017-12-17 LAB — LIPASE, BLOOD: Lipase: 26 U/L (ref 11–51)

## 2017-12-17 MED ORDER — KETOROLAC TROMETHAMINE 30 MG/ML IJ SOLN
15.0000 mg | Freq: Once | INTRAMUSCULAR | Status: AC
  Administered 2017-12-17: 15 mg via INTRAVENOUS
  Filled 2017-12-17: qty 1

## 2017-12-17 MED ORDER — DICYCLOMINE HCL 10 MG PO CAPS
20.0000 mg | ORAL_CAPSULE | Freq: Once | ORAL | Status: AC
  Administered 2017-12-17: 20 mg via ORAL
  Filled 2017-12-17: qty 2

## 2017-12-17 MED ORDER — ONDANSETRON HCL 4 MG/2ML IJ SOLN
4.0000 mg | INTRAMUSCULAR | Status: AC
  Administered 2017-12-17: 4 mg via INTRAVENOUS
  Filled 2017-12-17: qty 2

## 2017-12-17 MED ORDER — MORPHINE SULFATE (PF) 4 MG/ML IV SOLN
4.0000 mg | Freq: Once | INTRAVENOUS | Status: AC
  Administered 2017-12-17: 4 mg via INTRAVENOUS
  Filled 2017-12-17: qty 1

## 2017-12-17 NOTE — ED Triage Notes (Signed)
Patient reports epigastric pain radiating under right breast with vomiting and diarrhea times three days. Patient states that she was supposed gall bladder removed 4 years ago and did not. Patient reports that the pain feels similar to last time she had problems with her gall bladder.

## 2017-12-17 NOTE — Discharge Instructions (Addendum)
You have been seen in the Emergency Department (ED) for abdominal pain.  Your evaluation suggests that your pain is caused by gallstones.  Fortunately you do not need immediate surgery at this time, but it is important that you follow up with a surgeon as an outpatient; typically surgical removal of the gallbladder is the only thing that will definitively fix your issue.  Read through the included information about a bland diet, and use any prescribed medications as instructed.  Avoid smoking and alcohol use. ? ?Please follow up as instructed above regarding today?s emergent visit and the symptoms that are bothering you. ? ?Take Norco as prescribed. Do not drink alcohol, drive or participate in any other potentially dangerous activities while taking this medication as it may make you sleepy. Do not take this medication with any other sedating medications, either prescription or over-the-counter. If you were prescribed Percocet or Vicodin, do not take these with acetaminophen (Tylenol) as it is already contained within these medications. ?  ?This medication is an opiate (or narcotic) pain medication and can be habit forming.  Use it as little as possible to achieve adequate pain control.  Do not use or use it with extreme caution if you have a history of opiate abuse or dependence.  If you are on a pain contract with your primary care doctor or a pain specialist, be sure to let them know you were prescribed this medication today from the [facility].  This medication is intended for your use only - do not give any to anyone else and keep it in a secure place where nobody else, especially children, have access to it.  It will also cause or worsen constipation, so you may want to consider taking an over-the-counter stool softener while you are taking this medication. ? ?Return to the ED if your abdominal pain worsens or fails to improve, you develop bloody vomiting, bloody diarrhea, you are  unable to tolerate fluids due to vomiting, fever greater than 101, or other symptoms that concern you. ? ?

## 2017-12-17 NOTE — ED Provider Notes (Signed)
[facility] Emergency Department Provider Note  ____________________________________________   First MD Initiated Contact with Patient 12/17/17 2323     (approximate)  I have reviewed the triage vital signs and the nursing notes.   HISTORY  Chief Complaint Abdominal Pain; Emesis; and Diarrhea    HPI Amy Charles is a 47 y.o. female with medical history as listed below who presents for evaluation of about 3 days of constant epigastric abdominal pain radiating through to her back with nausea and vomiting, made worse when she eats.  She reports that for several weeks she has had episodes of nausea and occasional vomiting particularly after eating greasy food, but the symptoms are generally mild.  However the pain began rather acutely about 3 days ago and has been constant since then although it waxes and wanes in severity.  The pain is both sharp and aching and nothing in particular makes it better.  She has been having multiple episodes of vomiting and diarrhea over the last couple of days.  She denies fever/chills, chest pain, shortness of breath, and dysuria.  She states the pain feels very much like it did when she was diagnosed with gallstones for years ago but this is more severe.  Past Medical History:  Diagnosis Date  . Gallstones   . GERD (gastroesophageal reflux disease)   . IBS (irritable bowel syndrome)   . Migraines     Patient Active Problem List   Diagnosis Date Noted  . Chronic daily headache 05/18/2012    Past Surgical History:  Procedure Laterality Date  . BREAST BIOPSY    . NOVASURE ABLATION    . TUBAL LIGATION      Prior to Admission medications   Medication Sig Start Date End Date Taking? Authorizing Provider  alprazolam Duanne Moron) 2 MG tablet Take 2 mg by mouth. Bid as needed    [provider]  amphetamine-dextroamphetamine (ADDERALL) 20 MG tablet Take 20 mg by mouth 2 (two) times daily.    [provider]    dicyclomine (BENTYL) 10 MG capsule Take 1 capsule (10 mg total) by mouth 3 (three) times daily as needed for up to 14 days for spasms (abdominal pain). 12/18/17 01/01/18  Hinda Kehr, MD  docusate sodium (COLACE) 100 MG capsule Take 1 tablet once or twice daily as needed for constipation while taking narcotic pain medicine 12/18/17   Hinda Kehr, MD  esomeprazole (NEXIUM) 20 MG capsule Take 20 mg by mouth daily before breakfast.    [provider]  HYDROcodone-acetaminophen (NORCO/VICODIN) 5-325 MG tablet Take 1-2 tablets by mouth every 4 (four) hours as needed for moderate pain. 12/18/17   Hinda Kehr, MD  levocetirizine (XYZAL) 5 MG tablet Take 5 mg by mouth. q am    [provider]  ondansetron (ZOFRAN ODT) 4 MG disintegrating tablet Allow 1-2 tablets to dissolve in your mouth every 8 hours as needed for nausea/vomiting 12/18/17   Hinda Kehr, MD  promethazine (PHENERGAN) 25 MG tablet Take 25 mg by mouth every 6 (six) hours as needed.    [provider]  rizatriptan (MAXALT) 10 MG tablet Take 1 tablet (10 mg total) by mouth once as needed for migraine. May repeat in 2 hours if needed 05/16/12 05/16/13  Clearnce Sorrel, MD  topiramate (TOPAMAX) 25 MG tablet Take 1 tablet (25 mg total) by mouth 2 (two) times daily. 05/16/12   Clearnce Sorrel, MD  traMADol (ULTRAM) 50 MG tablet Take 50 mg by mouth every  6 (six) hours as needed.    [provider]    Allergies Sumatriptan and Tetracyclines & related  History reviewed. No pertinent family history.  Social History Social History   Tobacco Use  . Smoking status: Current Every Day Smoker    Packs/day: 0.50    Types: Cigarettes  . Smokeless tobacco: Never Used  Substance Use Topics  . Alcohol use: No  . Drug use: No    Review of Systems Constitutional: No fever/chills Eyes: No visual changes. ENT: No sore throat. Cardiovascular: Denies chest pain. Respiratory: Denies shortness of  breath. Gastrointestinal: Constant epigastric abdominal pain for about 3 days.  Frequent nausea, vomiting, diarrhea for several days Genitourinary: Negative for dysuria. Musculoskeletal: Negative for neck pain.  Negative for back pain. Integumentary: Negative for rash. Neurological: Negative for headaches, focal weakness or numbness.   ____________________________________________   PHYSICAL EXAM:  VITAL SIGNS: ED Triage Vitals [12/17/17 2022]  Enc Vitals Group     BP (!) 150/77     Pulse Rate 88     Resp 18     Temp (!) 97.5 F (36.4 C)     Temp Source Oral     SpO2 100 %     Weight 77.1 kg (170 lb)     Height 1.651 m (5\' 5" )     Head Circumference      Peak Flow      Pain Score 6     Pain Loc      Pain Edu?      Excl. in [place]?     Constitutional: Alert and oriented.  Appears uncomfortable but is in no acute distress Eyes: Conjunctivae are normal with no scleral icterus Head: Atraumatic. Nose: No congestion/rhinnorhea. Mouth/Throat: Mucous membranes are moist. Neck: No stridor.  No meningeal signs.   Cardiovascular: Normal rate, regular rhythm. Good peripheral circulation. Grossly normal heart sounds. Respiratory: Normal respiratory effort.  No retractions. Lungs CTAB. Gastrointestinal: Severe tenderness to palpation of the epigastrium and right upper quadrant with positive Murphy sign.  Mild diffuse tenderness to palpation of lower abdomen but with no rebound and no guarding and no focal  tenderness Musculoskeletal: No lower extremity tenderness nor edema. No gross deformities of extremities. Neurologic:  Normal speech and language. No gross focal neurologic deficits are appreciated.  Skin:  Skin is warm, dry and intact. No rash noted. Psychiatric: Mood and affect are normal. Speech and behavior are normal.  ____________________________________________   LABS (all labs ordered are listed, but only abnormal results are displayed)  Labs Reviewed  COMPREHENSIVE  METABOLIC PANEL - Abnormal; Notable for the following components:      Result Value   Calcium 8.8 (*)    All other components within normal limits  URINALYSIS, COMPLETE (UACMP) WITH MICROSCOPIC - Abnormal; Notable for the following components:   Color, Urine YELLOW (*)    APPearance CLEAR (*)    Hgb urine dipstick SMALL (*)    Squamous Epithelial / LPF 0-5 (*)    All other components within normal limits  LIPASE, BLOOD  CBC  TROPONIN I   ____________________________________________  EKG  None - EKG not ordered by ED physician ____________________________________________  RADIOLOGY   US Abdomen Limited Ruq  Result Date: 12/17/2017 CLINICAL DATA:  Right upper quadrant abdominal pain EXAM: ULTRASOUND ABDOMEN LIMITED RIGHT UPPER QUADRANT COMPARISON:  Ultrasound 10/07/2014 FINDINGS: Gallbladder: Shadowing stones measuring up to 12 mm. Negative sonographic Murphy. Normal wall thickness. Common bile duct: Diameter: Up to 6.8 mm distally Liver: No  focal lesion identified. Within normal limits in parenchymal echogenicity. Portal vein is patent on color Doppler imaging with normal direction of blood flow towards the liver. IMPRESSION: 1. Cholelithiasis without sonographic evidence for acute cholecystitis 2. Common duct upper normal to borderline enlarged. Electronically Signed   By: Donavan Foil M.D.   On: 12/17/2017 23:35    ____________________________________________   PROCEDURES  Critical Care performed: No   Procedure(s) performed:   Procedures   ____________________________________________   INITIAL IMPRESSION / ASSESSMENT AND PLAN / ED COURSE  As part of my medical decision making, I reviewed the following data within the [place] notes reviewed and incorporated, Labs reviewed  and [place] Controlled Substance Database    Differential diagnosis includes, but is not limited to, biliary disease (biliary colic, acute cholecystitis, cholangitis,  choledocholithiasis, etc), intrathoracic causes for epigastric abdominal pain including ACS, gastritis, duodenitis, pancreatitis, small bowel or large bowel obstruction, abdominal aortic aneurysm, hernia, and gastritis.  Based on the patient's physical exam, history of present illness, and prior medical history, I strongly suspect biliary colic with or without cholecystitis.  Given that she is afebrile and generally well-appearing with normal labs including no leukocytosis and no elevation of LFTs, I strongly doubt cholangitis.  Again, with no LFT elevation, no hyperbilirubinemia, no jaundice, no scleral icterus, I doubt choledocholithiasis.  I will provide analgesia and antiemetics and await the results of the ultrasound to determine if she would benefit from surgery consultation tonight or outpatient follow-up if her pain is uncontrolled or if she has an indication for urgent surgery.  She understands and agrees with the plan.  Clinical Course as of Dec 19 51  Tue Dec 17, 2017  2346 Ultrasound is reassuring with cholelithiasis but no evidence of cholecystitis.  Common bile duct diameter is at the upper limit of normal but still within normal limits and as described above I doubt an obstructive process such as choledocholithiasis.    I reviewed the patient's prescription history over the last 24 months in the multi-state controlled substances database(s) that includes [place], [place], [place], [place], [place], [place], [place], [place], [place], [place], [place], [place], [place], [place], and [place].  Results were notable for prescriptions for alprazolam and generic Adderall, but the last prescription was filled about 5 months ago and there are no prescriptions for narcotics or other sedatives.  Risk for abuse.  If we can control the acute pain tonight she may be a candidate for outpatient follow-up.   [CF]  Wed Dec 18, 2017  0030 The patient's pain is  relieved.  Tenderness to palpation at this time and she is moving around and looks much more comfortable.  She has tolerated some p.o. intake.  She is comfortable with the plan for discharge and outpatient follow-up.  I stressed to her the importance of following up with surgery as soon as possible to schedule an outpatient cholecystectomy.  I gave my usual and customary return precautions. She agrees with the plan.  [CF]    Clinical Course User Index [CF] Hinda Kehr, MD    ____________________________________________  FINAL CLINICAL IMPRESSION(S) / ED DIAGNOSES  Final diagnoses:  Abdominal pain  Biliary colic  Calculus of gallbladder without cholecystitis without obstruction     MEDICATIONS GIVEN DURING THIS VISIT:  Medications  ondansetron (ZOFRAN) injection 4 mg (4 mg Intravenous Given 12/17/17 2350)  ketorolac (TORADOL) 30 MG/ML injection 15 mg (15 mg Intravenous Given 12/17/17 2350)  morphine 4 MG/ML injection 4  mg (4 mg Intravenous Given 12/17/17 2350)  dicyclomine (BENTYL) capsule 20 mg (20 mg Oral Given 12/17/17 2349)     ED Discharge Orders        Ordered    dicyclomine (BENTYL) 10 MG capsule  3 times daily PRN     12/18/17 0051    HYDROcodone-acetaminophen (NORCO/VICODIN) 5-325 MG tablet  Every 4 hours PRN     12/18/17 0051    ondansetron (ZOFRAN ODT) 4 MG disintegrating tablet     12/18/17 0051    docusate sodium (COLACE) 100 MG capsule     12/18/17 0051       Note:  This document was prepared using Dragon voice recognition software and may include unintentional dictation errors.    Hinda Kehr, MD 12/18/17 [phone]

## 2017-12-18 LAB — URINALYSIS, COMPLETE (UACMP) WITH MICROSCOPIC
BACTERIA UA: NONE SEEN
BILIRUBIN URINE: NEGATIVE
Glucose, UA: NEGATIVE mg/dL
KETONES UR: NEGATIVE mg/dL
LEUKOCYTES UA: NEGATIVE
NITRITE: NEGATIVE
PH: 5 (ref 5.0–8.0)
Protein, ur: NEGATIVE mg/dL
Specific Gravity, Urine: 1.011 (ref 1.005–1.030)

## 2017-12-18 MED ORDER — ONDANSETRON 4 MG PO TBDP: ORAL_TABLET | ORAL | 0 refills | Status: DC

## 2017-12-18 MED ORDER — DOCUSATE SODIUM 100 MG PO CAPS: ORAL_CAPSULE | ORAL | 0 refills | Status: DC

## 2017-12-18 MED ORDER — HYDROCODONE-ACETAMINOPHEN 5-325 MG PO TABS: 1.0000 | ORAL_TABLET | ORAL | 0 refills | Status: DC | PRN

## 2017-12-18 MED ORDER — DICYCLOMINE HCL 10 MG PO CAPS: 10.0000 mg | ORAL_CAPSULE | Freq: Three times a day (TID) | ORAL | 0 refills | Status: DC | PRN

## 2017-12-24 ENCOUNTER — Observation Stay
Admission: AD | Admit: 2017-12-24 | Discharge: 2017-12-26 | Disposition: A | Discharge status: Home / Self Care | Payer: 59 | Source: Ambulatory Visit | Attending: Surgery | Admitting: Surgery

## 2017-12-24 ENCOUNTER — Other Ambulatory Visit: Payer: Self-pay

## 2017-12-24 ENCOUNTER — Ambulatory Visit: Payer: 59 | Admitting: General Surgery

## 2017-12-24 ENCOUNTER — Encounter: Payer: Self-pay | Admitting: General Surgery

## 2017-12-24 DIAGNOSIS — K219 Gastro-esophageal reflux disease without esophagitis: Secondary | ICD-10-CM | POA: Insufficient documentation

## 2017-12-24 DIAGNOSIS — K589 Irritable bowel syndrome without diarrhea: Secondary | ICD-10-CM | POA: Diagnosis not present

## 2017-12-24 DIAGNOSIS — K819 Cholecystitis, unspecified: Secondary | ICD-10-CM | POA: Insufficient documentation

## 2017-12-24 DIAGNOSIS — K8010 Calculus of gallbladder with chronic cholecystitis without obstruction: Principal | ICD-10-CM | POA: Insufficient documentation

## 2017-12-24 DIAGNOSIS — Z79899 Other long term (current) drug therapy: Secondary | ICD-10-CM | POA: Insufficient documentation

## 2017-12-24 DIAGNOSIS — K810 Acute cholecystitis: Secondary | ICD-10-CM | POA: Diagnosis present

## 2017-12-24 LAB — COMPREHENSIVE METABOLIC PANEL
ALT: 12 U/L — ABNORMAL LOW (ref 14–54)
ANION GAP: 10 (ref 5–15)
AST: 25 U/L (ref 15–41)
Albumin: 3.8 g/dL (ref 3.5–5.0)
Alkaline Phosphatase: 56 U/L (ref 38–126)
BUN: 10 mg/dL (ref 6–20)
CHLORIDE: 105 mmol/L (ref 101–111)
CO2: 23 mmol/L (ref 22–32)
Calcium: 8.6 mg/dL — ABNORMAL LOW (ref 8.9–10.3)
Creatinine, Ser: 0.62 mg/dL (ref 0.44–1.00)
GFR calc non Af Amer: >60 mL/min (ref >60)
Glucose, Bld: 132 mg/dL — ABNORMAL HIGH (ref 65–99)
Potassium: 3.4 mmol/L — ABNORMAL LOW (ref 3.5–5.1)
SODIUM: 138 mmol/L (ref 135–145)
Total Bilirubin: 0.2 mg/dL — ABNORMAL LOW (ref 0.3–1.2)
Total Protein: 6.6 g/dL (ref 6.5–8.1)

## 2017-12-24 LAB — SURGICAL PCR SCREEN
MRSA, PCR: NEGATIVE
Staphylococcus aureus: POSITIVE — AB

## 2017-12-24 LAB — APTT: aPTT: 29 seconds (ref 24–36)

## 2017-12-24 LAB — CBC WITH DIFFERENTIAL/PLATELET
Basophils Absolute: 0.1 10*3/uL (ref 0–0.1)
Basophils Relative: 1 %
EOS PCT: 4 %
Eosinophils Absolute: 0.3 10*3/uL (ref 0–0.7)
HCT: 40.7 % (ref 35.0–47.0)
Hemoglobin: 13.9 g/dL (ref 12.0–16.0)
LYMPHS ABS: 2.4 10*3/uL (ref 1.0–3.6)
Lymphocytes Relative: 33 %
MCH: 30.9 pg (ref 26.0–34.0)
MCHC: 34 g/dL (ref 32.0–36.0)
MCV: 91 fL (ref 80.0–100.0)
Monocytes Absolute: 0.4 10*3/uL (ref 0.2–0.9)
Monocytes Relative: 5 %
Neutro Abs: 4.2 10*3/uL (ref 1.4–6.5)
Neutrophils Relative %: 57 %
PLATELETS: 265 10*3/uL (ref 150–440)
RBC: 4.48 MIL/uL (ref 3.80–5.20)
RDW: 13.4 % (ref 11.5–14.5)
WBC: 7.4 10*3/uL (ref 3.6–11.0)

## 2017-12-24 LAB — PROTIME-INR
INR: 0.86
Prothrombin Time: 11.6 seconds (ref 11.4–15.2)

## 2017-12-24 MED ORDER — NICOTINE 21 MG/24HR TD PT24
21.0000 mg | MEDICATED_PATCH | Freq: Every day | TRANSDERMAL | Status: DC
  Administered 2017-12-24 – 2017-12-25 (×2): 21 mg via TRANSDERMAL
  Filled 2017-12-24 (×2): qty 1

## 2017-12-24 MED ORDER — ONDANSETRON HCL 4 MG/2ML IJ SOLN
4.0000 mg | Freq: Four times a day (QID) | INTRAMUSCULAR | Status: DC | PRN
  Administered 2017-12-25 (×2): 4 mg via INTRAVENOUS
  Filled 2017-12-24 (×2): qty 2

## 2017-12-24 MED ORDER — SODIUM CHLORIDE 0.9 % IV BOLUS (SEPSIS)
1000.0000 mL | Freq: Once | INTRAVENOUS | Status: AC
  Administered 2017-12-24: 1000 mL via INTRAVENOUS

## 2017-12-24 MED ORDER — KETOROLAC TROMETHAMINE 30 MG/ML IJ SOLN
30.0000 mg | Freq: Four times a day (QID) | INTRAMUSCULAR | Status: DC | PRN
  Administered 2017-12-24 – 2017-12-25 (×3): 30 mg via INTRAVENOUS
  Filled 2017-12-24 (×3): qty 1

## 2017-12-24 MED ORDER — PANTOPRAZOLE SODIUM 40 MG IV SOLR
40.0000 mg | Freq: Every day | INTRAVENOUS | Status: DC
  Administered 2017-12-24 – 2017-12-25 (×2): 40 mg via INTRAVENOUS
  Filled 2017-12-24 (×2): qty 40

## 2017-12-24 MED ORDER — DEXTROSE IN LACTATED RINGERS 5 % IV SOLN
INTRAVENOUS | Status: DC
  Administered 2017-12-24 – 2017-12-26 (×5): via INTRAVENOUS

## 2017-12-24 MED ORDER — DIPHENHYDRAMINE HCL 50 MG/ML IJ SOLN: 25.0000 mg | Freq: Four times a day (QID) | INTRAMUSCULAR | Status: DC | PRN

## 2017-12-24 MED ORDER — PIPERACILLIN-TAZOBACTAM 3.375 G IVPB
3.3750 g | Freq: Three times a day (TID) | INTRAVENOUS | Status: DC
  Administered 2017-12-24 – 2017-12-26 (×5): 3.375 g via INTRAVENOUS
  Filled 2017-12-24 (×5): qty 50

## 2017-12-24 MED ORDER — DIPHENHYDRAMINE HCL 25 MG PO CAPS: 25.0000 mg | ORAL_CAPSULE | Freq: Four times a day (QID) | ORAL | Status: DC | PRN

## 2017-12-24 MED ORDER — ONDANSETRON 4 MG PO TBDP: 4.0000 mg | ORAL_TABLET | Freq: Four times a day (QID) | ORAL | Status: DC | PRN

## 2017-12-24 MED ORDER — MORPHINE SULFATE (PF) 4 MG/ML IV SOLN
4.0000 mg | INTRAVENOUS | Status: DC | PRN
  Administered 2017-12-24 – 2017-12-25 (×2): 4 mg via INTRAVENOUS
  Filled 2017-12-24 (×2): qty 1

## 2017-12-24 NOTE — H&P (Signed)
Patient ID: Amy Charles, female   DOB: [birth year], 47 y.o.   MRN: [id number]  HPI Amy Charles is a 47 y.o. female  presented to our clinic for evaluation of abdominal pain.  Seen By Dr. Adonis Huguenin today, and last week seen in the ER. Patient reports she has had numerous bouts of right upper quadrant abdominal pain over the last 4 years.  However for the last 10 days she has had persistent right upper quadrant abdominal pain which caused her to be seen in the ER 7 days ago.  She reports she received pain medications in the ER that caused the pain to ease up but not go away.  Since being seen in the ER the pain has been constant.  She has been having fevers, chills, nausea, vomiting.  Her last vomiting was earlier today.  It does not matter what she eats or drinks it causes the pain to worsen.  She has also been having diarrhea without constipation.  She is very frustrated and tearful given the amount of discomfort she is in Personal review her ultrasound showing evidence of gallstones without cholecystitis. There is a normal common bile duct. She smokes daily. Previous Gyn surgery and tubal ligation   HPI  Past Medical History:  Diagnosis Date  . Gallstones   . GERD (gastroesophageal reflux disease)   . IBS (irritable bowel syndrome)   . Migraines     Past Surgical History:  Procedure Laterality Date  . BREAST BIOPSY    . NOVASURE ABLATION    . TUBAL LIGATION      History reviewed. No pertinent family history.  Social History Social History   Tobacco Use  . Smoking status: Current Every Day Smoker    Packs/day: 0.50    Types: Cigarettes  . Smokeless tobacco: Never Used  Substance Use Topics  . Alcohol use: No  . Drug use: No    Allergies  Allergen Reactions  . Sumatriptan     convulsions  . Tetracyclines & Related     Current Facility-Administered Medications  Medication Dose Route Frequency Provider Last Rate Last Dose  . dextrose 5 % in lactated ringers infusion    Intravenous Continuous Clayburn Pert, MD      . diphenhydrAMINE (BENADRYL) capsule 25 mg  25 mg Oral Q6H PRN Clayburn Pert, MD       Or  . diphenhydrAMINE (BENADRYL) injection 25 mg  25 mg Intravenous Q6H PRN Clayburn Pert, MD      . morphine 4 MG/ML injection 4 mg  4 mg Intravenous Q4H PRN Clayburn Pert, MD      . nicotine (NICODERM CQ - dosed in mg/24 hours) patch 21 mg  21 mg Transdermal Daily Clayburn Pert, MD      . ondansetron (ZOFRAN-ODT) disintegrating tablet 4 mg  4 mg Oral Q6H PRN Clayburn Pert, MD       Or  . ondansetron (ZOFRAN) injection 4 mg  4 mg Intravenous Q6H PRN Clayburn Pert, MD      . pantoprazole (PROTONIX) injection 40 mg  40 mg Intravenous QHS Clayburn Pert, MD      . piperacillin-tazobactam (ZOSYN) IVPB 3.375 g  3.375 g Intravenous Q8H Clayburn Pert, MD         Review of Systems Full ROS  was asked and was negative except for the information on the HPI  Physical Exam Blood pressure (!) 167/88, pulse 87, temperature 98.6 F (37 C), temperature source Oral, resp. rate 19, height 5\' 5"  (  1.651 m), weight 84 kg (185 lb 3.2 oz), SpO2 97 %. CONSTITUTIONAL: NAD EYES: Pupils are equal, round, and reactive to light, Sclera are non-icteric. EARS, NOSE, MOUTH AND THROAT: The oropharynx is clear. The oral mucosa is pink and moist. Hearing is intact to voice. LYMPH NODES:  Lymph nodes in the neck are normal. RESPIRATORY:  Lungs are clear. There is normal respiratory effort, with equal breath sounds bilaterally, and without pathologic use of accessory muscles. CARDIOVASCULAR: Heart is regular without murmurs, gallops, or rubs. GI: The abdomen is soft, TTP RUQ, no peritonitis. GU: Rectal deferred.   MUSCULOSKELETAL: Normal muscle strength and tone. No cyanosis or edema.   SKIN: Turgor is good and there are no pathologic skin lesions or ulcers. NEUROLOGIC: Motor and sensation is grossly normal. Cranial nerves are grossly intact. PSYCH:  Oriented to  person, place and time. Affect is normal.  Data Reviewed  I have personally reviewed the patient's imaging, laboratory findings and medical records.    Assessment/Plan     47 year old female with symptoms consistent with acute cholecystitis. We will keep her in the hospital for IV antibiotics. Liquid diet and nothing by mouth after midnight. Case discussed with Dr. Adonis Huguenin. Depending on operative or schedule we will try to do her in the next 24-48 hours. I discussed with her in detail that tomorrow I do have a very pack schedule but Dr. Adonis Huguenin may be able to do it.In any circumstance I do not think that she needs any emergent surgeries tonight  The risks, benefits, complications, treatment options, and expected outcomes were discussed with the patient. The possibilities of bleeding, recurrent infection, finding a normal gallbladder, perforation of viscus organs, damage to surrounding structures, bile leak, abscess formation, needing a drain placed, the need for additional procedures, reaction to medication, pulmonary aspiration,  failure to diagnose a condition, the possible need to convert to an open procedure, and creating a complication requiring transfusion or operation were discussed with the patient. The patient and/or family concurred with the proposed plan, giving informed consent.   Caroleen Hamman, MD FACS General Surgeon 12/24/2017, 6:25 PM

## 2017-12-24 NOTE — Progress Notes (Signed)
Patient ID: Amy Charles, female   DOB: Jan 15, 1971, 47 y.o.   MRN: [id number]  CC: Abdominal pain  HPI Amy Charles is a 47 y.o. female presents to clinic for evaluation of abdominal pain.  Patient reports she has had numerous bouts of right upper quadrant abdominal pain over the last 4 years.  However for the last 10 days she has had persistent right upper quadrant abdominal pain which caused her to be seen in the ER 7 days ago.  She reports she received pain medications in the ER that caused the pain to ease up but not go away.  Since being seen in the ER the pain has been constant.  She has been having fevers, chills, nausea, vomiting.  Her last vomiting was earlier today.  It does not matter what she eats or drinks it causes the pain to worsen.  She has also been having diarrhea without constipation.  She is very frustrated and tearful given the amount of discomfort she is in.  HPI  Past Medical History:  Diagnosis Date  . Gallstones   . GERD (gastroesophageal reflux disease)   . IBS (irritable bowel syndrome)   . Migraines     Past Surgical History:  Procedure Laterality Date  . BREAST BIOPSY    . NOVASURE ABLATION    . TUBAL LIGATION      History reviewed. No pertinent family history.  Social History Social History   Tobacco Use  . Smoking status: Current Every Day Smoker    Packs/day: 0.50    Types: Cigarettes  . Smokeless tobacco: Never Used  Substance Use Topics  . Alcohol use: No  . Drug use: No    Allergies  Allergen Reactions  . Sumatriptan     convulsions  . Tetracyclines & Related     Current Outpatient Medications  Medication Sig Dispense Refill  . alprazolam (XANAX) 2 MG tablet Take 2 mg by mouth. Bid as needed    . amphetamine-dextroamphetamine (ADDERALL) 20 MG tablet Take 20 mg by mouth 2 (two) times daily.    Marland Kitchen dicyclomine (BENTYL) 10 MG capsule Take 1 capsule (10 mg total) by mouth 3 (three) times daily as needed for up to 14 days for spasms  (abdominal pain). 30 capsule 0  . docusate sodium (COLACE) 100 MG capsule Take 1 tablet once or twice daily as needed for constipation while taking narcotic pain medicine 30 capsule 0  . esomeprazole (NEXIUM) 20 MG capsule Take 20 mg by mouth daily before breakfast.    . HYDROcodone-acetaminophen (NORCO/VICODIN) 5-325 MG tablet Take 1-2 tablets by mouth every 4 (four) hours as needed for moderate pain. 15 tablet 0  . levocetirizine (XYZAL) 5 MG tablet Take 5 mg by mouth. q am    . ondansetron (ZOFRAN ODT) 4 MG disintegrating tablet Allow 1-2 tablets to dissolve in your mouth every 8 hours as needed for nausea/vomiting 30 tablet 0  . promethazine (PHENERGAN) 25 MG tablet Take 25 mg by mouth every 6 (six) hours as needed.    . rizatriptan (MAXALT) 10 MG tablet Take 1 tablet (10 mg total) by mouth once as needed for migraine. May repeat in 2 hours if needed 10 tablet 4  . topiramate (TOPAMAX) 25 MG tablet Take 1 tablet (25 mg total) by mouth 2 (two) times daily. 180 tablet 6  . traMADol (ULTRAM) 50 MG tablet Take 50 mg by mouth every 6 (six) hours as needed.     No current facility-administered medications for  this visit.      Review of Systems A multi-point review of systems was asked and was negative except for the findings documented in the HPI  Physical Exam Blood pressure (!) 163/79, pulse 79, temperature 98.8 F (37.1 C), temperature source Oral, height 5\' 6"  (1.676 m), weight 84.1 kg (185 lb 8 oz). CONSTITUTIONAL: No acute distress. EYES: Pupils are equal, round, and reactive to light, Sclera are non-icteric. EARS, NOSE, MOUTH AND THROAT: The oropharynx is clear. The oral mucosa is pink and moist. Hearing is intact to voice. LYMPH NODES:  Lymph nodes in the neck are normal. RESPIRATORY:  Lungs are clear. There is normal respiratory effort, with equal breath sounds bilaterally, and without pathologic use of accessory muscles. CARDIOVASCULAR: Heart is regular without murmurs, gallops, or  rubs. GI: The abdomen is soft, tender to palpation in the right upper quadrant with a positive Murphy sign, and nondistended. There are no palpable masses. There is no hepatosplenomegaly. There are normal bowel sounds in all quadrants. GU: Rectal deferred.   MUSCULOSKELETAL: Normal muscle strength and tone. No cyanosis or edema.   SKIN: Turgor is good and there are no pathologic skin lesions or ulcers. NEUROLOGIC: Motor and sensation is grossly normal. Cranial nerves are grossly intact. PSYCH:  Oriented to person, place and time. Affect is normal.  Data Reviewed Images and labs reviewed from the ER last week which showed a normal white blood cell count 9.0, normal LFTs.  Ultrasound of the right upper quadrant showed cholelithiasis without evidence of cholecystitis from 1 week ago. I have personally reviewed the patient's imaging, laboratory findings and medical records.    Assessment    Acute cholecystitis    Plan    47 year old female with physical exam findings today consistent with acute cholecystitis.  Discussed this diagnosis in detail with the patient and that given her presentation that I would recommend a direct admission to the hospital for hydration, resuscitation, IV antibiotics.  Plan would be for a laparoscopic cholecystectomy within the next 24-48 hours.  Discussed that she would be seen by my partner in the hospital, Dr. Dahlia Byes in between himself and myself we would have her surgery accomplished during this hospital stay. I discussed the procedure in detail.  The patient was given Neurosurgeon.  We discussed the risks and benefits of a laparoscopic cholecystectomy and possible cholangiogram including, but not limited to bleeding, infection, injury to surrounding structures such as the intestine or liver, bile leak, retained gallstones, need to convert to an open procedure, prolonged diarrhea, blood clots such as  DVT, common bile duct injury, anesthesia risks, and possible  need for additional procedures.  The likelihood of improvement in symptoms and return to the patient's normal status is good. We discussed the typical post-operative recovery course. Patient voiced understanding and agrees with this plan.     Time spent with the patient was 50 minutes, with more than 50% of the time spent in face-to-face education, counseling and care coordination.     Clayburn Pert, MD FACS General Surgeon 12/24/2017, 3:37 PM

## 2017-12-24 NOTE — Patient Instructions (Signed)
Please go to the [place] to the Registration desk and let them know that you are being admitted to [facility].

## 2017-12-24 NOTE — Addendum Note (Signed)
Addended by: Wayna Chalet on: 12/24/2017 04:32 PM   Modules accepted: Orders

## 2017-12-25 ENCOUNTER — Observation Stay: Payer: 59 | Admitting: Anesthesiology

## 2017-12-25 ENCOUNTER — Encounter
Admission: AD | Disposition: A | Discharge status: Home / Self Care | Payer: Self-pay | Source: Ambulatory Visit | Attending: Surgery

## 2017-12-25 ENCOUNTER — Encounter: Payer: Self-pay | Admitting: Anesthesiology

## 2017-12-25 DIAGNOSIS — K8010 Calculus of gallbladder with chronic cholecystitis without obstruction: Secondary | ICD-10-CM | POA: Diagnosis not present

## 2017-12-25 DIAGNOSIS — K810 Acute cholecystitis: Secondary | ICD-10-CM | POA: Diagnosis not present

## 2017-12-25 HISTORY — PX: CHOLECYSTECTOMY: SHX55

## 2017-12-25 LAB — CBC
HEMATOCRIT: 36.6 % (ref 35.0–47.0)
HEMOGLOBIN: 12.6 g/dL (ref 12.0–16.0)
MCH: 31.3 pg (ref 26.0–34.0)
MCHC: 34.4 g/dL (ref 32.0–36.0)
MCV: 91.1 fL (ref 80.0–100.0)
Platelets: 247 10*3/uL (ref 150–440)
RBC: 4.02 MIL/uL (ref 3.80–5.20)
RDW: 13.3 % (ref 11.5–14.5)
WBC: 8.2 10*3/uL (ref 3.6–11.0)

## 2017-12-25 LAB — COMPREHENSIVE METABOLIC PANEL
ALK PHOS: 49 U/L (ref 38–126)
ALT: 11 U/L — ABNORMAL LOW (ref 14–54)
ANION GAP: 6 (ref 5–15)
AST: 15 U/L (ref 15–41)
Albumin: 3.3 g/dL — ABNORMAL LOW (ref 3.5–5.0)
BILIRUBIN TOTAL: 0.3 mg/dL (ref 0.3–1.2)
BUN: 8 mg/dL (ref 6–20)
CALCIUM: 7.8 mg/dL — AB (ref 8.9–10.3)
CO2: 22 mmol/L (ref 22–32)
Chloride: 109 mmol/L (ref 101–111)
Creatinine, Ser: 0.63 mg/dL (ref 0.44–1.00)
GFR calc non Af Amer: >60 mL/min (ref >60)
Glucose, Bld: 91 mg/dL (ref 65–99)
Potassium: 3.1 mmol/L — ABNORMAL LOW (ref 3.5–5.1)
SODIUM: 137 mmol/L (ref 135–145)
TOTAL PROTEIN: 5.8 g/dL — AB (ref 6.5–8.1)

## 2017-12-25 LAB — POCT PREGNANCY, URINE: Preg Test, Ur: NEGATIVE

## 2017-12-25 SURGERY — LAPAROSCOPIC CHOLECYSTECTOMY
Anesthesia: General | Site: Abdomen | Wound class: Clean

## 2017-12-25 MED ORDER — MORPHINE SULFATE (PF) 2 MG/ML IV SOLN
2.0000 mg | INTRAVENOUS | Status: DC | PRN
  Administered 2017-12-25 – 2017-12-26 (×2): 2 mg via INTRAVENOUS
  Filled 2017-12-25 (×2): qty 1

## 2017-12-25 MED ORDER — ONDANSETRON HCL 4 MG/2ML IJ SOLN
INTRAMUSCULAR | Status: DC | PRN
  Administered 2017-12-25: 4 mg via INTRAVENOUS

## 2017-12-25 MED ORDER — OXYCODONE HCL 5 MG PO TABS
5.0000 mg | ORAL_TABLET | ORAL | Status: DC | PRN
  Administered 2017-12-25: 5 mg via ORAL
  Administered 2017-12-26: 10 mg via ORAL
  Filled 2017-12-25: qty 2
  Filled 2017-12-25: qty 1

## 2017-12-25 MED ORDER — DEXAMETHASONE SODIUM PHOSPHATE 10 MG/ML IJ SOLN
INTRAMUSCULAR | Status: DC | PRN
  Administered 2017-12-25: 10 mg via INTRAVENOUS

## 2017-12-25 MED ORDER — PROPOFOL 10 MG/ML IV BOLUS
INTRAVENOUS | Status: DC | PRN
  Administered 2017-12-25: 200 mg via INTRAVENOUS

## 2017-12-25 MED ORDER — MIDAZOLAM HCL 2 MG/2ML IJ SOLN
INTRAMUSCULAR | Status: DC | PRN
  Administered 2017-12-25 (×2): 1 mg via INTRAVENOUS

## 2017-12-25 MED ORDER — PROMETHAZINE HCL 25 MG/ML IJ SOLN: 6.2500 mg | INTRAMUSCULAR | Status: DC | PRN

## 2017-12-25 MED ORDER — DEXAMETHASONE SODIUM PHOSPHATE 10 MG/ML IJ SOLN
INTRAMUSCULAR | Status: AC
  Filled 2017-12-25: qty 1

## 2017-12-25 MED ORDER — LABETALOL HCL 5 MG/ML IV SOLN
INTRAVENOUS | Status: AC
  Filled 2017-12-25: qty 4

## 2017-12-25 MED ORDER — FENTANYL CITRATE (PF) 100 MCG/2ML IJ SOLN
INTRAMUSCULAR | Status: AC
Start: 2017-12-25 — End: ?
  Filled 2017-12-25: qty 2

## 2017-12-25 MED ORDER — ACETAMINOPHEN 10 MG/ML IV SOLN
INTRAVENOUS | Status: DC | PRN
  Administered 2017-12-25: 1000 mg via INTRAVENOUS

## 2017-12-25 MED ORDER — PROPOFOL 10 MG/ML IV BOLUS
INTRAVENOUS | Status: AC
  Filled 2017-12-25: qty 20

## 2017-12-25 MED ORDER — MUPIROCIN 2 % EX OINT
1.0000 "application " | TOPICAL_OINTMENT | Freq: Two times a day (BID) | CUTANEOUS | Status: DC
  Administered 2017-12-25 (×2): 1 via NASAL
  Filled 2017-12-25: qty 22

## 2017-12-25 MED ORDER — BUPIVACAINE-EPINEPHRINE (PF) 0.25% -1:200000 IJ SOLN
INTRAMUSCULAR | Status: DC | PRN
  Administered 2017-12-25: 30 mL

## 2017-12-25 MED ORDER — SUCCINYLCHOLINE CHLORIDE 20 MG/ML IJ SOLN
INTRAMUSCULAR | Status: DC | PRN
  Administered 2017-12-25: 100 mg via INTRAVENOUS

## 2017-12-25 MED ORDER — ROCURONIUM BROMIDE 100 MG/10ML IV SOLN
INTRAVENOUS | Status: DC | PRN
  Administered 2017-12-25: 40 mg via INTRAVENOUS

## 2017-12-25 MED ORDER — SUCCINYLCHOLINE CHLORIDE 20 MG/ML IJ SOLN
INTRAMUSCULAR | Status: AC
  Filled 2017-12-25: qty 1

## 2017-12-25 MED ORDER — FENTANYL CITRATE (PF) 100 MCG/2ML IJ SOLN
INTRAMUSCULAR | Status: DC | PRN
  Administered 2017-12-25 (×4): 50 ug via INTRAVENOUS

## 2017-12-25 MED ORDER — LABETALOL HCL 5 MG/ML IV SOLN
INTRAVENOUS | Status: DC | PRN
  Administered 2017-12-25: 10 mg via INTRAVENOUS

## 2017-12-25 MED ORDER — LIDOCAINE HCL (PF) 2 % IJ SOLN
INTRAMUSCULAR | Status: AC
  Filled 2017-12-25: qty 10

## 2017-12-25 MED ORDER — FENTANYL CITRATE (PF) 100 MCG/2ML IJ SOLN
INTRAMUSCULAR | Status: AC
  Filled 2017-12-25: qty 2

## 2017-12-25 MED ORDER — CHLORHEXIDINE GLUCONATE CLOTH 2 % EX PADS
6.0000 | MEDICATED_PAD | Freq: Every day | CUTANEOUS | Status: DC
  Administered 2017-12-25: 6 via TOPICAL

## 2017-12-25 MED ORDER — SCOPOLAMINE 1 MG/3DAYS TD PT72
MEDICATED_PATCH | TRANSDERMAL | Status: AC
  Administered 2017-12-25: 1.5 mg via TRANSDERMAL
  Filled 2017-12-25: qty 1

## 2017-12-25 MED ORDER — ROCURONIUM BROMIDE 50 MG/5ML IV SOLN
INTRAVENOUS | Status: AC
  Filled 2017-12-25: qty 1

## 2017-12-25 MED ORDER — LACTATED RINGERS IV SOLN
INTRAVENOUS | Status: DC | PRN
  Administered 2017-12-25: 15:00:00 via INTRAVENOUS

## 2017-12-25 MED ORDER — ACETAMINOPHEN 10 MG/ML IV SOLN
INTRAVENOUS | Status: AC
  Filled 2017-12-25: qty 100

## 2017-12-25 MED ORDER — POTASSIUM CHLORIDE 10 MEQ/100ML IV SOLN
10.0000 meq | INTRAVENOUS | Status: AC
  Administered 2017-12-25 (×3): 10 meq via INTRAVENOUS
  Filled 2017-12-25 (×3): qty 100

## 2017-12-25 MED ORDER — SCOPOLAMINE 1 MG/3DAYS TD PT72
1.0000 | MEDICATED_PATCH | Freq: Once | TRANSDERMAL | Status: DC
  Administered 2017-12-25: 1.5 mg via TRANSDERMAL

## 2017-12-25 MED ORDER — ONDANSETRON HCL 4 MG/2ML IJ SOLN
INTRAMUSCULAR | Status: AC
  Filled 2017-12-25: qty 2

## 2017-12-25 MED ORDER — MIDAZOLAM HCL 2 MG/2ML IJ SOLN
INTRAMUSCULAR | Status: AC
  Filled 2017-12-25: qty 2

## 2017-12-25 MED ORDER — KETOROLAC TROMETHAMINE 30 MG/ML IJ SOLN
30.0000 mg | Freq: Four times a day (QID) | INTRAMUSCULAR | Status: DC | PRN
  Administered 2017-12-26: 30 mg via INTRAVENOUS
  Filled 2017-12-25: qty 1

## 2017-12-25 MED ORDER — BUPIVACAINE-EPINEPHRINE (PF) 0.25% -1:200000 IJ SOLN
INTRAMUSCULAR | Status: AC
  Filled 2017-12-25: qty 30

## 2017-12-25 MED ORDER — FENTANYL CITRATE (PF) 100 MCG/2ML IJ SOLN
INTRAMUSCULAR | Status: AC
  Administered 2017-12-25: 25 ug via INTRAVENOUS
  Filled 2017-12-25: qty 2

## 2017-12-25 MED ORDER — FENTANYL CITRATE (PF) 100 MCG/2ML IJ SOLN
25.0000 ug | INTRAMUSCULAR | Status: DC | PRN
  Administered 2017-12-25 (×4): 25 ug via INTRAVENOUS

## 2017-12-25 MED ORDER — LIDOCAINE HCL (CARDIAC) 20 MG/ML IV SOLN
INTRAVENOUS | Status: DC | PRN
  Administered 2017-12-25: 100 mg via INTRAVENOUS

## 2017-12-25 MED ORDER — ACETAMINOPHEN 500 MG PO TABS: 1000.0000 mg | ORAL_TABLET | Freq: Four times a day (QID) | ORAL | Status: DC | PRN

## 2017-12-25 SURGICAL SUPPLY — 47 items
ADH SKN CLS APL DERMABOND .7 (GAUZE/BANDAGES/DRESSINGS) ×1
APPLIER CLIP 5 13 M/L LIGAMAX5 (MISCELLANEOUS) ×2
APR CLP MED LRG 5 ANG JAW (MISCELLANEOUS) ×1
BAG SPEC RTRVL LRG 6X4 10 (ENDOMECHANICALS) ×1
BLADE SURG 15 STRL LF DISP TIS (BLADE) ×1 IMPLANT
BLADE SURG 15 STRL SS (BLADE) ×2
CANISTER SUCT 1200ML W/VALVE (MISCELLANEOUS) ×2 IMPLANT
CATH CHOLANGI 4FR 420404F (CATHETERS) IMPLANT
CHLORAPREP W/TINT 26ML (MISCELLANEOUS) ×2 IMPLANT
CLIP APPLIE 5 13 M/L LIGAMAX5 (MISCELLANEOUS) ×1 IMPLANT
CONRAY 60ML FOR OR (MISCELLANEOUS) ×2 IMPLANT
DERMABOND ADVANCED (GAUZE/BANDAGES/DRESSINGS) ×1
DERMABOND ADVANCED .7 DNX12 (GAUZE/BANDAGES/DRESSINGS) ×1 IMPLANT
DRAPE C-ARM XRAY 36X54 (DRAPES) IMPLANT
ELECT REM PT RETURN 9FT ADLT (ELECTROSURGICAL) ×2
ELECTRODE REM PT RTRN 9FT ADLT (ELECTROSURGICAL) ×1 IMPLANT
FILTER LAP SMOKE EVAC STRL (MISCELLANEOUS) ×2 IMPLANT
GLOVE SURG SYN 7.0 (GLOVE) ×2 IMPLANT
GLOVE SURG SYN 7.0 PF PI (GLOVE) ×1 IMPLANT
GLOVE SURG SYN 7.5  E (GLOVE) ×1
GLOVE SURG SYN 7.5 E (GLOVE) ×1 IMPLANT
GLOVE SURG SYN 7.5 PF PI (GLOVE) ×1 IMPLANT
GOWN STRL REUS W/ TWL LRG LVL3 (GOWN DISPOSABLE) ×3 IMPLANT
GOWN STRL REUS W/TWL LRG LVL3 (GOWN DISPOSABLE) ×6
IRRIGATION STRYKERFLOW (MISCELLANEOUS) IMPLANT
IRRIGATOR STRYKERFLOW (MISCELLANEOUS)
IV CATH ANGIO 12GX3 LT BLUE (NEEDLE) ×2 IMPLANT
IV NS 1000ML (IV SOLUTION)
IV NS 1000ML BAXH (IV SOLUTION) IMPLANT
JACKSON PRATT 10 (INSTRUMENTS) IMPLANT
L-HOOK LAP DISP 36CM (ELECTROSURGICAL) ×2
LABEL OR SOLS (LABEL) ×2 IMPLANT
LHOOK LAP DISP 36CM (ELECTROSURGICAL) ×1 IMPLANT
NEEDLE HYPO 22GX1.5 SAFETY (NEEDLE) ×4 IMPLANT
PACK LAP CHOLECYSTECTOMY (MISCELLANEOUS) ×2 IMPLANT
PENCIL ELECTRO HAND CTR (MISCELLANEOUS) ×2 IMPLANT
POUCH SPECIMEN RETRIEVAL 10MM (ENDOMECHANICALS) ×2 IMPLANT
SCISSORS METZENBAUM CVD 33 (INSTRUMENTS) ×2 IMPLANT
SLEEVE ADV FIXATION 5X100MM (TROCAR) ×6 IMPLANT
SPONGE VERSALON 4X4 4PLY (MISCELLANEOUS) IMPLANT
SUT MNCRL 4-0 (SUTURE) ×2
SUT MNCRL 4-0 27XMFL (SUTURE) ×1
SUT VICRYL 0 AB UR-6 (SUTURE) ×2 IMPLANT
SUTURE MNCRL 4-0 27XMF (SUTURE) ×1 IMPLANT
TROCAR BALLN GELPORT 12X130M (ENDOMECHANICALS) ×2 IMPLANT
TROCAR Z-THREAD OPTICAL 5X100M (TROCAR) ×2 IMPLANT
TUBING INSUFFLATION (TUBING) ×2 IMPLANT

## 2017-12-25 NOTE — Anesthesia Post-op Follow-up Note (Signed)
Anesthesia QCDR form completed.        

## 2017-12-25 NOTE — H&P (View-Only) (Signed)
Doing better. Dr. Hampton Abbot or Dr. Adonis Huguenin might be able to perform chole this pm. Depending on the OR schedule

## 2017-12-25 NOTE — Anesthesia Procedure Notes (Signed)
Procedure Name: Intubation Date/Time: 12/25/2017 3:13 PM Performed by: Johnna Acosta, CRNA Pre-anesthesia Checklist: Patient identified, Emergency Drugs available, Suction available, Patient being monitored and Timeout performed Patient Re-evaluated:Patient Re-evaluated prior to induction Oxygen Delivery Method: Circle system utilized Preoxygenation: Pre-oxygenation with 100% oxygen Induction Type: IV induction and Cricoid Pressure applied Ventilation: Mask ventilation without difficulty and Oral airway inserted - appropriate to patient size Laryngoscope Size: Sabra Heck and 2 Grade View: Grade II Tube type: Oral Tube size: 7.0 mm Airway Equipment and Method: Stylet Placement Confirmation: ETT inserted through vocal cords under direct vision,  positive ETCO2 and breath sounds checked- equal and bilateral Secured at: 22 cm Tube secured with: Tape Dental Injury: Teeth and Oropharynx as per pre-operative assessment  Difficulty Due To: Difficult Airway- due to anterior larynx

## 2017-12-25 NOTE — Interval H&P Note (Signed)
History and Physical Interval Note:  12/25/2017 2:26 PM  Amy Charles  has presented today for surgery, with the diagnosis of symptomatic cholelithiasis The various methods of treatment have been discussed with the patient and family. After consideration of risks, benefits and other options for treatment, the patient has consented to  Procedure(s): LAPAROSCOPIC CHOLECYSTECTOMY (N/A) as a surgical intervention .  The patient's history has been reviewed, patient examined, no change in status, stable for surgery.  I have reviewed the patient's chart and labs.  Questions were answered to the patient's satisfaction.  The patient understands that given time conflicts in the OR, I will be performing the surgery instead of Dr. Dahlia Byes and she's willing to proceed.   Amy Charles

## 2017-12-25 NOTE — Transfer of Care (Signed)
Immediate Anesthesia Transfer of Care Note  Patient: Amy Charles  Procedure(s) Performed: LAPAROSCOPIC CHOLECYSTECTOMY (N/A Abdomen)  Patient Location: PACU  Anesthesia Type:General  Level of Consciousness: sedated  Airway & Oxygen Therapy: Patient Spontanous Breathing and Patient connected to face mask oxygen  Post-op Assessment: Report given to RN and Post -op Vital signs reviewed and stable  Post vital signs: Reviewed and stable  Last Vitals:  Vitals:   12/25/17 1412 12/25/17 1652  BP: (!) 152/81 (!) 158/77  Pulse: 69 79  Resp: 17   Temp: (!) 36.1 C 36.4 C  SpO2: 97%     Last Pain:  Vitals:   12/25/17 1412  TempSrc: Tympanic  PainSc: 6          Complications: No apparent anesthesia complications

## 2017-12-25 NOTE — Op Note (Signed)
  Procedure Date:  12/25/2017  Pre-operative Diagnosis:   Acute cholecystitis  Post-operative Diagnosis:  Acute cholecystitis  Procedure:  Laparoscopic cholecystectomy  Surgeon:  Melvyn Neth, MD  Anesthesia:  General endotracheal  Estimated Blood Loss:  20 ml  Specimens:  gallbladder  Complications:  None  Indications for Procedure:  This is a 47 y.o. female who presents with abdominal pain and workup revealing acute cholecystitis.  The benefits, complications, treatment options, and expected outcomes were discussed with the patient. The risks of bleeding, infection, recurrence of symptoms, failure to resolve symptoms, bile duct damage, bile duct leak, retained common bile duct stone, bowel injury, and need for further procedures were all discussed with the patient and she was willing to proceed.  Description of Procedure: The patient was correctly identified in the preoperative area and brought into the operating room.  The patient was placed supine with VTE prophylaxis in place.  Appropriate time-outs were performed.  Anesthesia was induced and the patient was intubated.  Appropriate antibiotics were infused.  The abdomen was prepped and draped in a sterile fashion. An infraumbilical incision was made. A cutdown technique was used to enter the abdominal cavity without injury, and a Hasson trocar was inserted.  Pneumoperitoneum was obtained with appropriate opening pressures.  A 5-mm port was placed in the subxiphoid area and two 5-mm ports were placed in the right upper quadrant under direct visualization.  The gallbladder was identified.  The fundus was grasped and retracted cephalad.  Adhesions were lysed bluntly and with electrocautery. The infundibulum was grasped and retracted laterally, exposing the peritoneum overlying the gallbladder.  This was incised with electrocautery and extended on either side of the gallbladder.  The cystic duct and cystic artery were clearly identified  and bluntly dissected.  Both were clipped twice proximally and once distally, cutting in between.  The gallbladder was taken from the gallbladder fossa in a retrograde fashion with electrocautery.  There was oozing from the liver bed throughout.  The gallbladder was placed in an Endocatch bag and brought out via the umbilical incision. The liver bed was inspected and any bleeding was controlled with electrocautery and a piece of Surgicel was also placed over the liver bed. The right upper quadrant was then inspected again revealing intact clips, no bleeding, and no ductal injury.  The area was thoroughly irrigated.  The 5 mm ports were removed under direct visualization and the Hasson trocar was removed.  The fascial opening was closed using 0 vicryl suture.  Local anesthetic was infused in all incisions and the incisions were closed with 4-0 Monocryl.  The wounds were cleaned and sealed with DermaBond.  The patient was emerged from anesthesia and extubated and brought to the recovery room for further management.  The patient tolerated the procedure well and all counts were correct at the end of the case.   Melvyn Neth, MD

## 2017-12-25 NOTE — Anesthesia Preprocedure Evaluation (Signed)
Anesthesia Evaluation  Patient identified by MRN, date of birth, ID band Patient awake    Reviewed: Allergy & Precautions, H&P , NPO status , Patient's Chart, lab work & pertinent test results, reviewed documented beta blocker date and time   History of Anesthesia Complications (+) PONV and history of anesthetic complications  Airway Mallampati: II  TM Distance: >3 FB Neck ROM: full    Dental  (+) Caps, Dental Advidsory Given, Missing, Chipped, Poor Dentition   Pulmonary neg shortness of breath, neg sleep apnea, neg COPD, Recent URI , Residual Cough, Current Smoker,           Cardiovascular Exercise Tolerance: Good negative cardio ROS       Neuro/Psych  Headaches, neg Seizures negative psych ROS   GI/Hepatic Neg liver ROS, GERD  ,  Endo/Other  negative endocrine ROS  Renal/GU negative Renal ROS  negative genitourinary   Musculoskeletal   Abdominal   Peds  Hematology negative hematology ROS (+)   Anesthesia Other Findings Past Medical History: No date: Gallstones No date: GERD (gastroesophageal reflux disease) No date: IBS (irritable bowel syndrome) No date: Migraines   Reproductive/Obstetrics negative OB ROS                             Anesthesia Physical Anesthesia Plan  ASA: II  Anesthesia Plan: General   Post-op Pain Management:    Induction: Intravenous  PONV Risk Score and Plan: 3 and Ondansetron, Dexamethasone and Scopolamine patch - Pre-op  Airway Management Planned: Oral ETT  Additional Equipment:   Intra-op Plan:   Post-operative Plan: Extubation in OR  Informed Consent: I have reviewed the patients History and Physical, chart, labs and discussed the procedure including the risks, benefits and alternatives for the proposed anesthesia with the patient or authorized representative who has indicated his/her understanding and acceptance.   Dental Advisory  Given  Plan Discussed with: Anesthesiologist, CRNA and Surgeon  Anesthesia Plan Comments:         Anesthesia Quick Evaluation

## 2017-12-25 NOTE — Progress Notes (Signed)
Doing better. Dr. Hampton Abbot or Dr. Adonis Huguenin might be able to perform chole this pm. Depending on the OR schedule

## 2017-12-26 ENCOUNTER — Encounter: Payer: Self-pay | Admitting: Surgery

## 2017-12-26 ENCOUNTER — Ambulatory Visit: Payer: Self-pay | Admitting: General Surgery

## 2017-12-26 DIAGNOSIS — K8010 Calculus of gallbladder with chronic cholecystitis without obstruction: Secondary | ICD-10-CM | POA: Diagnosis not present

## 2017-12-26 MED ORDER — HYDROCODONE-ACETAMINOPHEN 5-325 MG PO TABS: 1.0000 | ORAL_TABLET | Freq: Four times a day (QID) | ORAL | 0 refills | Status: DC | PRN

## 2017-12-26 MED ORDER — DEXTROSE 50 % IV SOLN
INTRAVENOUS | Status: AC
  Filled 2017-12-26: qty 50

## 2017-12-26 NOTE — Progress Notes (Signed)
IV was removed. Discharge instructions, follow-up appointments, and prescriptions were provided to the pt and husband at the bedside. All questions answered. The pt was taken downstairs via wheelchair by the volunteer.

## 2017-12-26 NOTE — Anesthesia Postprocedure Evaluation (Signed)
Anesthesia Post Note  Patient: Amy Charles  Procedure(s) Performed: LAPAROSCOPIC CHOLECYSTECTOMY (N/A Abdomen)  Patient location during evaluation: PACU Anesthesia Type: General Level of consciousness: awake and alert Pain management: pain level controlled Vital Signs Assessment: post-procedure vital signs reviewed and stable Respiratory status: spontaneous breathing, nonlabored ventilation, respiratory function stable and patient connected to nasal cannula oxygen Cardiovascular status: blood pressure returned to baseline and stable Postop Assessment: no apparent nausea or vomiting Anesthetic complications: no     Last Vitals:  Vitals:   12/26/17 0011 12/26/17 0525  BP: (!) 150/66 132/64  Pulse: 77 72  Resp: 16 16  Temp: 36.9 C 36.7 C  SpO2: 95% 96%    Last Pain:  Vitals:   12/26/17 0831  TempSrc:   PainSc: 4                  Martha Clan

## 2017-12-26 NOTE — Discharge Summary (Signed)
Patient ID: Amy Charles MRN: [id number] DOB/AGE: [birth year] 47 y.o.  Admit date: 12/24/2017 Discharge date: 12/26/2017   Discharge Diagnoses:  Active Problems:   Cholecystitis   Cholecystitis, acute   Procedures: laparoscopic cholecystectomy  Hospital Course: v 47 year old female female admitted from the office for acute cholecystitis and underwent an uneventful laparoscopic cholecystectomy by Dr Hampton Abbot. Time of discharge she was ambulating, tolerating regular diet her vital signs were stable.  Physical exam showed a female in no acute distress.  Awake and alert.  Abdomen: Soft, incisions healing well without infection.  No peritonitis.  Extremities: No edema and well perfused.  Condition at time of discharge was stable   Disposition: 01-Home or Self Care  Discharge Instructions    Call MD for:  difficulty breathing, headache or visual disturbances   Complete by:  As directed    Call MD for:  extreme fatigue   Complete by:  As directed    Call MD for:  hives   Complete by:  As directed    Call MD for:  persistant dizziness or light-headedness   Complete by:  As directed    Call MD for:  persistant nausea and vomiting   Complete by:  As directed    Call MD for:  redness, tenderness, or signs of infection (pain, swelling, redness, odor or green/yellow discharge around incision site)   Complete by:  As directed    Call MD for:  severe uncontrolled pain   Complete by:  As directed    Call MD for:  temperature >100.4   Complete by:  As directed    Diet - low sodium heart healthy   Complete by:  As directed    Discharge instructions   Complete by:  As directed    Shower Friday am   Increase activity slowly   Complete by:  As directed    Lifting restrictions   Complete by:  As directed    20 lbs x 6 wks     Allergies as of 12/26/2017      Reactions   Sumatriptan    convulsions   Tetracyclines & Related       Medication List    TAKE these medications   alprazolam 2 MG  tablet Commonly known as:  XANAX Take 2 mg by mouth. Bid as needed   amphetamine-dextroamphetamine 20 MG tablet Commonly known as:  ADDERALL Take 20 mg by mouth 2 (two) times daily.   dicyclomine 10 MG capsule Commonly known as:  BENTYL Take 1 capsule (10 mg total) by mouth 3 (three) times daily as needed for up to 14 days for spasms (abdominal pain).   docusate sodium 100 MG capsule Commonly known as:  COLACE Take 1 tablet once or twice daily as needed for constipation while taking narcotic pain medicine   esomeprazole 20 MG capsule Commonly known as:  NEXIUM Take 20 mg by mouth daily before breakfast.   HYDROcodone-acetaminophen 5-325 MG tablet Commonly known as:  NORCO/VICODIN Take 1-2 tablets by mouth every 6 (six) hours as needed for moderate pain.   levocetirizine 5 MG tablet Commonly known as:  XYZAL Take 5 mg by mouth. q am   ondansetron 4 MG disintegrating tablet Commonly known as:  ZOFRAN ODT Allow 1-2 tablets to dissolve in your mouth every 8 hours as needed for nausea/vomiting   rizatriptan 10 MG tablet Commonly known as:  MAXALT Take 1 tablet (10 mg total) by mouth once as needed for migraine. May repeat in 2 hours  if needed   topiramate 25 MG tablet Commonly known as:  TOPAMAX Take 1 tablet (25 mg total) by mouth 2 (two) times daily.   traMADol 50 MG tablet Commonly known as:  ULTRAM Take 50 mg by mouth every 6 (six) hours as needed.      Follow-up Information    Olean Ree, MD. Go on 01/09/2018.   Specialty:  Surgery Why:  Thursday at 10:00am for hospital follow-up Contact information: [address] [place] [address] [phone]            Caroleen Hamman, MD FACS

## 2017-12-27 LAB — SURGICAL PATHOLOGY

## 2017-12-30 ENCOUNTER — Other Ambulatory Visit: Payer: Self-pay

## 2017-12-30 ENCOUNTER — Encounter: Payer: Self-pay | Admitting: Surgery

## 2017-12-30 ENCOUNTER — Emergency Department: Payer: 59

## 2017-12-30 ENCOUNTER — Telehealth: Payer: Self-pay | Admitting: Surgery

## 2017-12-30 ENCOUNTER — Encounter: Payer: Self-pay | Admitting: Emergency Medicine

## 2017-12-30 ENCOUNTER — Emergency Department
Admission: EM | Admit: 2017-12-30 | Discharge: 2017-12-30 | Disposition: A | Discharge status: Home / Self Care | Payer: 59 | Attending: Emergency Medicine | Admitting: Emergency Medicine

## 2017-12-30 ENCOUNTER — Ambulatory Visit (INDEPENDENT_AMBULATORY_CARE_PROVIDER_SITE_OTHER): Payer: 59 | Admitting: Surgery

## 2017-12-30 VITALS — BP 169/92 | HR 94 | Temp 98.3°F | Ht 65.0 in | Wt 185.8 lb

## 2017-12-30 DIAGNOSIS — R109 Unspecified abdominal pain: Secondary | ICD-10-CM

## 2017-12-30 DIAGNOSIS — R509 Fever, unspecified: Secondary | ICD-10-CM | POA: Diagnosis not present

## 2017-12-30 DIAGNOSIS — R1011 Right upper quadrant pain: Secondary | ICD-10-CM | POA: Diagnosis not present

## 2017-12-30 DIAGNOSIS — R110 Nausea: Secondary | ICD-10-CM | POA: Diagnosis not present

## 2017-12-30 DIAGNOSIS — Z9049 Acquired absence of other specified parts of digestive tract: Secondary | ICD-10-CM

## 2017-12-30 DIAGNOSIS — K819 Cholecystitis, unspecified: Secondary | ICD-10-CM

## 2017-12-30 DIAGNOSIS — F17210 Nicotine dependence, cigarettes, uncomplicated: Secondary | ICD-10-CM | POA: Insufficient documentation

## 2017-12-30 DIAGNOSIS — Z79899 Other long term (current) drug therapy: Secondary | ICD-10-CM | POA: Insufficient documentation

## 2017-12-30 LAB — COMPREHENSIVE METABOLIC PANEL
ALBUMIN: 4.4 g/dL (ref 3.5–5.0)
ALT: 41 U/L (ref 14–54)
ANION GAP: 9 (ref 5–15)
AST: 17 U/L (ref 15–41)
Alkaline Phosphatase: 71 U/L (ref 38–126)
BILIRUBIN TOTAL: 0.2 mg/dL — AB (ref 0.3–1.2)
BUN: 8 mg/dL (ref 6–20)
CHLORIDE: 104 mmol/L (ref 101–111)
CO2: 22 mmol/L (ref 22–32)
Calcium: 9.4 mg/dL (ref 8.9–10.3)
Creatinine, Ser: 0.61 mg/dL (ref 0.44–1.00)
GFR calc Af Amer: >60 mL/min (ref >60)
GFR calc non Af Amer: >60 mL/min (ref >60)
GLUCOSE: 97 mg/dL (ref 65–99)
POTASSIUM: 4.8 mmol/L (ref 3.5–5.1)
SODIUM: 135 mmol/L (ref 135–145)
Total Protein: 7.7 g/dL (ref 6.5–8.1)

## 2017-12-30 LAB — CK: Total CK: 46 U/L (ref 38–234)

## 2017-12-30 LAB — LIPASE, BLOOD: LIPASE: 31 U/L (ref 11–51)

## 2017-12-30 LAB — CBC
HEMATOCRIT: 44.6 % (ref 35.0–47.0)
Hemoglobin: 15.2 g/dL (ref 12.0–16.0)
MCH: 30.7 pg (ref 26.0–34.0)
MCHC: 34.1 g/dL (ref 32.0–36.0)
MCV: 90 fL (ref 80.0–100.0)
Platelets: 335 10*3/uL (ref 150–440)
RBC: 4.95 MIL/uL (ref 3.80–5.20)
RDW: 13.5 % (ref 11.5–14.5)
WBC: 10.6 10*3/uL (ref 3.6–11.0)

## 2017-12-30 LAB — URINALYSIS, COMPLETE (UACMP) WITH MICROSCOPIC
BILIRUBIN URINE: NEGATIVE
GLUCOSE, UA: NEGATIVE mg/dL
KETONES UR: NEGATIVE mg/dL
Leukocytes, UA: NEGATIVE
Nitrite: NEGATIVE
PH: 6 (ref 5.0–8.0)
Protein, ur: NEGATIVE mg/dL
Specific Gravity, Urine: 1.005 (ref 1.005–1.030)

## 2017-12-30 MED ORDER — IOPAMIDOL (ISOVUE-300) INJECTION 61%
100.0000 mL | Freq: Once | INTRAVENOUS | Status: AC | PRN
  Administered 2017-12-30: 100 mL via INTRAVENOUS

## 2017-12-30 MED ORDER — MORPHINE SULFATE (PF) 4 MG/ML IV SOLN
4.0000 mg | Freq: Once | INTRAVENOUS | Status: AC
  Administered 2017-12-30: 4 mg via INTRAVENOUS
  Filled 2017-12-30: qty 1

## 2017-12-30 MED ORDER — HYDROCODONE-ACETAMINOPHEN 5-325 MG PO TABS
1.0000 | ORAL_TABLET | Freq: Once | ORAL | Status: AC
Start: 2017-12-30 — End: 2017-12-30
  Administered 2017-12-30: 1 via ORAL
  Filled 2017-12-30: qty 1

## 2017-12-30 MED ORDER — ONDANSETRON HCL 4 MG/2ML IJ SOLN
4.0000 mg | Freq: Once | INTRAMUSCULAR | Status: AC
Start: 2017-12-30 — End: 2017-12-30
  Administered 2017-12-30: 4 mg via INTRAVENOUS
  Filled 2017-12-30: qty 2

## 2017-12-30 MED ORDER — AMOXICILLIN-POT CLAVULANATE 875-125 MG PO TABS
1.0000 | ORAL_TABLET | Freq: Once | ORAL | Status: AC
  Administered 2017-12-30: 1 via ORAL
  Filled 2017-12-30: qty 1

## 2017-12-30 MED ORDER — SODIUM CHLORIDE 0.9 % IV BOLUS (SEPSIS)
1000.0000 mL | Freq: Once | INTRAVENOUS | Status: AC
  Administered 2017-12-30: 1000 mL via INTRAVENOUS

## 2017-12-30 MED ORDER — HYDROCODONE-ACETAMINOPHEN 5-325 MG PO TABS: 1.0000 | ORAL_TABLET | Freq: Four times a day (QID) | ORAL | 0 refills | Status: DC | PRN

## 2017-12-30 MED ORDER — AMOXICILLIN-POT CLAVULANATE 875-125 MG PO TABS: 1.0000 | ORAL_TABLET | Freq: Two times a day (BID) | ORAL | 0 refills | Status: DC

## 2017-12-30 NOTE — Telephone Encounter (Signed)
Patients calling about her incision has a green pus bubble, pain in right side, pain level being a 10. Denies fever, feels a little nausea but no vomiting. Please call patient and advise.

## 2017-12-30 NOTE — Discharge Instructions (Signed)
You were seen in the emergency room for abdominal pain. It is important that you follow up closely with Dr. Hampton Abbot on Thursday in his clinic. Start augmentin (antibiotic.)  Please return to the emergency room right away if you are to develop a fever, severe nausea, your pain becomes severe or worsens, you are unable to keep food down, begin vomiting any dark or bloody fluid, you develop any dark or bloody stools, feel dehydrated, or other new concerns or symptoms arise.  No driving this evening.

## 2017-12-30 NOTE — Patient Instructions (Addendum)
We are sending you to the Emergency Department.  We will follow up with you following this visit.

## 2017-12-30 NOTE — ED Notes (Signed)
Pt in testing, not in hallway bed currently.

## 2017-12-30 NOTE — ED Notes (Signed)
ED Provider at bedside. 

## 2017-12-30 NOTE — ED Provider Notes (Signed)
[facility] Emergency Department Provider Note   ____________________________________________   First MD Initiated Contact with Patient 12/30/17 1750     (approximate)  I have reviewed the triage vital signs and the nursing notes.   HISTORY  Chief Complaint Abdominal Pain    HPI Amy Charles is a 47 y.o. female recent laparoscopic cholecystectomy.  Recent acute cholecystitis.  Patient reports she is been having her feeling like she is having fevers for the last day and a half with some generalized muscle aches been having increasing pain for the last day and a half in her right upper abdomen.  Located in the right upper area where she had her gallbladder removed and it radiates slightly to the right mid back.  Denies chest pain.  Does not radiate to the chest.  No pleuritic pain.  No trouble breathing.  Denies cough or congestion.  Denies urinary symptoms.  Reports the pain is severe in the right upper abdomen, worsened with movement.  Some nausea but no vomiting.  She has had a couple of loose watery stools the last couple of days as well.  Continues to be able to eat and drink okay   Past Medical History:  Diagnosis Date  . Gallstones   . GERD (gastroesophageal reflux disease)   . IBS (irritable bowel syndrome)   . Migraines     Patient Active Problem List   Diagnosis Date Noted  . Cholecystitis, acute 12/25/2017  . Cholecystitis 12/24/2017  . Chronic daily headache 05/18/2012    Past Surgical History:  Procedure Laterality Date  . BREAST BIOPSY    . CHOLECYSTECTOMY N/A 12/25/2017   Procedure: LAPAROSCOPIC CHOLECYSTECTOMY;  Surgeon: Olean Ree, MD;  Location: [facility] ORS;  Service: General;  Laterality: N/A;  . NOVASURE ABLATION    . TUBAL LIGATION      Prior to Admission medications   Medication Sig Start Date End Date Taking? Authorizing Provider  alprazolam Duanne Moron) 2 MG tablet Take 2 mg by mouth. Bid as needed    [provider]  amoxicillin-clavulanate (AUGMENTIN) 875-125 MG tablet Take 1 tablet by mouth 2 (two) times daily. 12/30/17   Delman Kitten, MD  amphetamine-dextroamphetamine (ADDERALL) 20 MG tablet Take 20 mg by mouth 2 (two) times daily.    [provider]  dicyclomine (BENTYL) 10 MG capsule Take 1 capsule (10 mg total) by mouth 3 (three) times daily as needed for up to 14 days for spasms (abdominal pain). 12/18/17 01/01/18  Hinda Kehr, MD  docusate sodium (COLACE) 100 MG capsule Take 1 tablet once or twice daily as needed for constipation while taking narcotic pain medicine 12/18/17   Hinda Kehr, MD  esomeprazole (NEXIUM) 20 MG capsule Take 20 mg by mouth daily before breakfast.    [provider]  HYDROcodone-acetaminophen (NORCO/VICODIN) 5-325 MG tablet Take 1-2 tablets by mouth every 6 (six) hours as needed for moderate pain. 12/30/17   Olean Ree, MD  levocetirizine (XYZAL) 5 MG tablet Take 5 mg by mouth. q am    [provider]  ondansetron (ZOFRAN ODT) 4 MG disintegrating tablet Allow 1-2 tablets to dissolve in your mouth every 8 hours as needed for nausea/vomiting 12/18/17   Hinda Kehr, MD  rizatriptan (MAXALT) 10 MG tablet Take 1 tablet (10 mg total) by mouth once as needed for migraine. May repeat in 2 hours if needed 05/16/12 05/16/13  Clearnce Sorrel, MD  topiramate (TOPAMAX) 25 MG tablet Take 1 tablet (25 mg total) by  mouth 2 (two) times daily. 05/16/12   Clearnce Sorrel, MD  traMADol (ULTRAM) 50 MG tablet Take 50 mg by mouth every 6 (six) hours as needed.    [provider]    Allergies Sumatriptan and Tetracyclines & related  No family history on file.  Social History Social History   Tobacco Use  . Smoking status: Current Every Day Smoker    Packs/day: 0.50    Types: Cigarettes  . Smokeless tobacco: Never Used  Substance Use Topics  . Alcohol use: No  . Drug use: No    Review of Systems Constitutional: Fatigue, some general chills  eyes: No  visual changes. ENT: No sore throat. Cardiovascular: Denies chest pain. Respiratory: Denies shortness of breath. Gastrointestinal:  No constipation. Genitourinary: Negative for dysuria.  Denies pregnancy, reports previous bilateral tubal ligation as well as NovaSure procedure. Musculoskeletal: Negative for back pain. Skin: Negative for rash. Neurological: Negative for headaches, focal weakness or numbness.    ____________________________________________   PHYSICAL EXAM:  VITAL SIGNS: ED Triage Vitals  Enc Vitals Group     BP 12/30/17 1448 (!) 156/105     Pulse Rate 12/30/17 1448 94     Resp 12/30/17 1448 18     Temp 12/30/17 1448 97.7 F (36.5 C)     Temp Source 12/30/17 1448 Oral     SpO2 12/30/17 1448 95 %     Weight 12/30/17 1450 185 lb (83.9 kg)     Height 12/30/17 1450 5\' 5"  (1.651 m)     Head Circumference --      Peak Flow --      Pain Score 12/30/17 1450 10     Pain Loc --      Pain Edu? --      Excl. in [place]? --     Constitutional: Alert and oriented. Very pleasant.  Appears slightly fatigued and mildly ill in general.  She is in no distress Eyes: Conjunctivae are normal. Head: Atraumatic. Nose: No congestion/rhinnorhea. Mouth/Throat: Mucous membranes are moist. Neck: No stridor.   Cardiovascular: Normal rate, regular rhythm. Grossly normal heart sounds.  Good peripheral circulation. Respiratory: Normal respiratory effort.  No retractions. Lungs CTAB. Gastrointestinal: Patient reports pain to percussion in the right upper quadrant, she reports notable tenderness in the right upper quadrant.  Her incisions appear clean dry and intact without surrounding erythema.  Minimal discomfort in the left lower quadrant and the right lower quadrant, but focal and fairly exquisite tenderness in the right upper quadrant as denoted. Musculoskeletal: No lower extremity tenderness nor edema. Neurologic:  Normal speech and language. No gross focal neurologic deficits are  appreciated.  Skin:  Skin is warm, dry and intact. No rash noted. Psychiatric: Mood and affect are normal. Speech and behavior are normal.  ____________________________________________   LABS (all labs ordered are listed, but only abnormal results are displayed)  Labs Reviewed  COMPREHENSIVE METABOLIC PANEL - Abnormal; Notable for the following components:      Result Value   Total Bilirubin 0.2 (*)    All other components within normal limits  URINALYSIS, COMPLETE (UACMP) WITH MICROSCOPIC - Abnormal; Notable for the following components:   Color, Urine STRAW (*)    APPearance CLEAR (*)    Hgb urine dipstick SMALL (*)    All other components within normal limits  CULTURE, BLOOD (ROUTINE X 2)  CULTURE, BLOOD (ROUTINE X 2)  LIPASE, BLOOD  CBC  CK   ____________________________________________  EKG   ____________________________________________  RADIOLOGY  Ultrasound result reviewed, cholecystectomy otherwise normal right upper quadrant.  CT result reviewed, also discussed with Dr. Hampton Abbot who saw and evaluated the patient in the ER and reviewed the CT ____________________________________________   PROCEDURES  Procedure(s) performed: None  Procedures  Critical Care performed: No  ____________________________________________   INITIAL IMPRESSION / ASSESSMENT AND PLAN / ED COURSE  Pertinent labs & imaging results that were available during my care of the patient were reviewed by me and considered in my medical decision making (see chart for details).  Presents for pain in the right upper quadrant.  Recent cholecystectomy.  Reporting subjective fevers at home, though none measured.  Is currently afebrile.  Notable right upper quadrant tenderness, with rebound pain.  Denies pain in the lower abdomen, no associated urinary symptoms.  No cardiac or pulmonary symptoms.  Stable hemodynamics  Clinical Course as of Dec 31 2027  Mon Dec 30, 2017  1815 Case discussed  with [facility], he is actually expecting her to come the ER.  He agrees with proceeding with a CT scan and notes that general surgery would be happy to see her in consult thereafter.  [MQ]  1942 Dr. Hampton Abbot has seen and evaluated in ER Recommends Augmentin 875mg  for 10 days, follow-up Thursday.   [MQ]    Clinical Course User Index [MQ] Delman Kitten, MD    ----------------------------------------- 8:28 PM on 12/30/2017 -----------------------------------------  Patient discharged home as recommended by Dr. Hampton Abbot.  Patient would like to be discharged home, antibiotics as advised by general surgery with a plan of close follow-up on Thursday in the clinic and careful and strict return precautions discussed with the patient.  Appears awake alert no evidence of sepsis, appears appropriate for outpatient therapy at this time.  Has been driving her home  Return precautions and treatment recommendations and follow-up discussed with the patient who is agreeable with the plan.  ____________________________________________   FINAL CLINICAL IMPRESSION(S) / ED DIAGNOSES  Final diagnoses:  Right sided abdominal pain  Status post cholecystectomy      NEW MEDICATIONS STARTED DURING THIS VISIT:  Discharge Medication List as of 12/30/2017  7:46 PM    START taking these medications   Details  amoxicillin-clavulanate (AUGMENTIN) 875-125 MG tablet Take 1 tablet by mouth 2 (two) times daily., Starting Mon 12/30/2017, Print         Note:  This document was prepared using Dragon voice recognition software and may include unintentional dictation errors.     Delman Kitten, MD 12/30/17 2029

## 2017-12-30 NOTE — Consult Note (Signed)
Date of Consultation:  12/30/2017  Requesting Physician:   Delman Kitten, MD  Reason for Consultation:  Abdominal pain  History of Present Illness: Amy Charles is a 46 y.o. female s/p laparoscopic cholecystectomy on 1/30.  She had oozing from the liver bed and a piece of Surgicel was left over the liver bed.  She was doing well but two days ago started having worsening right upper quadrant pain and reports she had fever today, but she measured her temperature at 8 F after taking Tylenol, so it's unclear what her temperature actually was.  She has been having some nausea but no emesis.  She tried high dose Motrin at home which did not do much for the pain, and she also has been taking Vicodin which is helping.  She reports having loose stools, but non-bloody.  Past Medical History: Past Medical History:  Diagnosis Date  . Gallstones   . GERD (gastroesophageal reflux disease)   . IBS (irritable bowel syndrome)   . Migraines      Past Surgical History: Past Surgical History:  Procedure Laterality Date  . BREAST BIOPSY    . CHOLECYSTECTOMY N/A 12/25/2017   Procedure: LAPAROSCOPIC CHOLECYSTECTOMY;  Surgeon: Olean Ree, MD;  Location: [facility] ORS;  Service: General;  Laterality: N/A;  . NOVASURE ABLATION    . TUBAL LIGATION      Home Medications: Prior to Admission medications   Medication Sig Start Date End Date Taking? Authorizing Provider  alprazolam Duanne Moron) 2 MG tablet Take 2 mg by mouth. Bid as needed    [provider]  amoxicillin-clavulanate (AUGMENTIN) 875-125 MG tablet Take 1 tablet by mouth 2 (two) times daily. 12/30/17   Delman Kitten, MD  amphetamine-dextroamphetamine (ADDERALL) 20 MG tablet Take 20 mg by mouth 2 (two) times daily.    [provider]  dicyclomine (BENTYL) 10 MG capsule Take 1 capsule (10 mg total) by mouth 3 (three) times daily as needed for up to 14 days for spasms (abdominal pain). 12/18/17 01/01/18  Hinda Kehr, MD  docusate sodium (COLACE)  100 MG capsule Take 1 tablet once or twice daily as needed for constipation while taking narcotic pain medicine 12/18/17   Hinda Kehr, MD  esomeprazole (NEXIUM) 20 MG capsule Take 20 mg by mouth daily before breakfast.    [provider]  HYDROcodone-acetaminophen (NORCO/VICODIN) 5-325 MG tablet Take 1-2 tablets by mouth every 6 (six) hours as needed for moderate pain. 12/30/17   Olean Ree, MD  levocetirizine (XYZAL) 5 MG tablet Take 5 mg by mouth. q am    [provider]  ondansetron (ZOFRAN ODT) 4 MG disintegrating tablet Allow 1-2 tablets to dissolve in your mouth every 8 hours as needed for nausea/vomiting 12/18/17   Hinda Kehr, MD  rizatriptan (MAXALT) 10 MG tablet Take 1 tablet (10 mg total) by mouth once as needed for migraine. May repeat in 2 hours if needed 05/16/12 05/16/13  Clearnce Sorrel, MD  topiramate (TOPAMAX) 25 MG tablet Take 1 tablet (25 mg total) by mouth 2 (two) times daily. 05/16/12   Clearnce Sorrel, MD  traMADol (ULTRAM) 50 MG tablet Take 50 mg by mouth every 6 (six) hours as needed.    [provider]    Allergies: Allergies  Allergen Reactions  . Sumatriptan     convulsions  . Tetracyclines & Related     Social History:  reports that she has been smoking cigarettes.  She has been smoking about 0.50 packs per day. she has  never used smokeless tobacco. She reports that she does not drink alcohol or use drugs.   Family History: No gallbladder history in the family.  Review of Systems: Review of Systems  Constitutional: Positive for fever. Negative for chills.  HENT: Negative for hearing loss.   Respiratory: Negative for shortness of breath.   Cardiovascular: Negative for chest pain.  Gastrointestinal: Positive for abdominal pain, diarrhea and nausea. Negative for constipation and vomiting.  Genitourinary: Negative for dysuria.  Musculoskeletal: Negative for myalgias.  Skin: Positive for itching. Negative for rash.  Neurological:  Negative for dizziness.  Psychiatric/Behavioral: Negative for depression.    Physical Exam BP 118/68   Pulse 94   Temp 98.2 F (36.8 C)   Resp 16   Ht 5\' 5"  (1.651 m)   Wt 83.9 kg (185 lb)   SpO2 95%   BMI 30.79 kg/m  CONSTITUTIONAL: No acute distress HEENT:  Normocephalic, atraumatic, extraocular motion intact. NECK: Trachea is midline, and there is no jugular venous distension.  RESPIRATORY:  Lungs are clear, and breath sounds are equal bilaterally. Normal respiratory effort without pathologic use of accessory muscles. CARDIOVASCULAR: Heart is regular without murmurs, gallops, or rubs. GI: The abdomen is soft, non-distended, with tenderness to palpation over right upper quadrant and umbilical incision.  Incisions are clean, dry, intact with some ecchymosis particularly at infraumbilical and subxyphoid incisions.  No evidence of wound infection and no drainage.  MUSCULOSKELETAL:  Normal muscle strength and tone in all four extremities.  No peripheral edema or cyanosis. SKIN: Skin turgor is normal. There are no pathologic skin lesions.  NEUROLOGIC:  Motor and sensation is grossly normal.  Cranial nerves are grossly intact. PSYCH:  Alert and oriented to person, place and time. Affect is normal.  Laboratory Analysis: Results for orders placed or performed during the hospital encounter of 12/30/17 (from the past 24 hour(s))  Lipase, blood     Status: None   Collection Time: 12/30/17  3:18 PM  Result Value Ref Range   Lipase 31 11 - 51 U/L  Comprehensive metabolic panel     Status: Abnormal   Collection Time: 12/30/17  3:18 PM  Result Value Ref Range   Sodium 135 135 - 145 mmol/L   Potassium 4.8 3.5 - 5.1 mmol/L   Chloride 104 101 - 111 mmol/L   CO2 22 22 - 32 mmol/L   Glucose, Bld 97 65 - 99 mg/dL   BUN 8 6 - 20 mg/dL   Creatinine, Ser 0.61 0.44 - 1.00 mg/dL   Calcium 9.4 8.9 - 10.3 mg/dL   Total Protein 7.7 6.5 - 8.1 g/dL   Albumin 4.4 3.5 - 5.0 g/dL   AST 17 15 - 41 U/L    ALT 41 14 - 54 U/L   Alkaline Phosphatase 71 38 - 126 U/L   Total Bilirubin 0.2 (L) 0.3 - 1.2 mg/dL   GFR calc non Af Amer >60 >60 mL/min   GFR calc Af Amer >60 >60 mL/min   Anion gap 9 5 - 15  CBC     Status: None   Collection Time: 12/30/17  3:18 PM  Result Value Ref Range   WBC 10.6 3.6 - 11.0 K/uL   RBC 4.95 3.80 - 5.20 MIL/uL   Hemoglobin 15.2 12.0 - 16.0 g/dL   HCT 44.6 35.0 - 47.0 %   MCV 90.0 80.0 - 100.0 fL   MCH 30.7 26.0 - 34.0 pg   MCHC 34.1 32.0 - 36.0 g/dL   RDW 13.5  11.5 - 14.5 %   Platelets 335 150 - 440 K/uL  Urinalysis, Complete w Microscopic     Status: Abnormal   Collection Time: 12/30/17  3:18 PM  Result Value Ref Range   Color, Urine STRAW (A) YELLOW   APPearance CLEAR (A) CLEAR   Specific Gravity, Urine 1.005 1.005 - 1.030   pH 6.0 5.0 - 8.0   Glucose, UA NEGATIVE NEGATIVE mg/dL   Hgb urine dipstick SMALL (A) NEGATIVE   Bilirubin Urine NEGATIVE NEGATIVE   Ketones, ur NEGATIVE NEGATIVE mg/dL   Protein, ur NEGATIVE NEGATIVE mg/dL   Nitrite NEGATIVE NEGATIVE   Leukocytes, UA NEGATIVE NEGATIVE  CK     Status: None   Collection Time: 12/30/17  3:18 PM  Result Value Ref Range   Total CK 46 38 - 234 U/L    Imaging: Dg Chest 2 View  Result Date: 12/30/2017 CLINICAL DATA:  Cholecystectomy December 25, 2017 with severe right upper quadrant pain, nausea, and fever. EXAM: CHEST  2 VIEW COMPARISON:  None. FINDINGS: The heart size and mediastinal contours are within normal limits. Both lungs are clear. The visualized skeletal structures are unremarkable. IMPRESSION: No active cardiopulmonary disease. Electronically Signed   By: Dorise Bullion III M.D   On: 12/30/2017 18:35   Ct Abdomen Pelvis W Contrast  Result Date: 12/30/2017 CLINICAL DATA:  Abdominal pain, had gallbladder removed on 12/25/2017, 2 days ago developed severe RIGHT upper quadrant pain, nausea and fever, question abscess EXAM: CT ABDOMEN AND PELVIS WITH CONTRAST TECHNIQUE: Multidetector CT imaging of  the abdomen and pelvis was performed using the standard protocol following bolus administration of intravenous contrast. Sagittal and coronal MPR images reconstructed from axial data set. CONTRAST:  131mL ISOVUE-300 IOPAMIDOL (ISOVUE-300) INJECTION 61% IV. No oral contrast administered. COMPARISON:  None FINDINGS: Lower chest: Lung bases clear Hepatobiliary: Liver normal appearance. No biliary dilatation. Small fluid collection with foci of gas identified at gallbladder fossa 5.4 x 2.7 x 2.7 cm, most likely represents postsurgical changes with fluid and packing material/Surgicel at the surgical bed post cholecystectomy though cannot entirely exclude infection by CT. No significant surrounding inflammatory/infiltrative changes. Pancreas: Normal appearance The Spleen: Normal appearance Adrenals/Urinary Tract: Adrenal glands normal appearance. Small cyst upper pole LEFT kidney. Kidneys, ureters, and bladder normal appearance. Stomach/Bowel: Normal appendix. Stomach and bowel loops normal appearance for technique. Vascular/Lymphatic: Atherosclerotic calcifications aorta without aneurysm. No adenopathy. Few pelvic phleboliths. Reproductive: Prior tubal ligation. Uterus and ovaries otherwise unremarkable. Other: No free air or free fluid. Tiny foci of gas at the umbilical trocar track without significant fluid collection to suggest subcutaneous abscess. No hernia. Musculoskeletal: No acute osseous findings. IMPRESSION: Small fluid collection with foci of gas at gallbladder fossa, may represent postsurgical changes or fluid and packing material/Surgicel in gallbladder fossa post cholecystectomy though CT is not able to completely exclude infection; if an infected collection remains a clinical concern consider either follow-up imaging or aspiration. Foci of gas at the umbilical trocar track from laparoscopy without abdominal wall fluid collection to suggest abscess. Remainder of exam unremarkable. Electronically Signed    By: Lavonia Dana M.D.   On: 12/30/2017 18:54   US Abdomen Limited Ruq  Result Date: 12/30/2017 CLINICAL DATA:  RIGHT upper quadrant pain for 2 days, history GERD, irritable bowel syndrome, cholelithiasis, post cholecystectomy on 12/25/2017 EXAM: ULTRASOUND ABDOMEN LIMITED RIGHT UPPER QUADRANT COMPARISON:  12/17/2017 FINDINGS: Gallbladder: Surgically removed since prior exam Common bile duct: Diameter: 5 mm diameter, normal Liver: Normal echogenicity without mass or nodularity.  No intrahepatic biliary dilatation. Portal vein is patent on color Doppler imaging with normal direction of blood flow towards the liver. No RIGHT upper quadrant free fluid. IMPRESSION: Interval cholecystectomy. Otherwise negative exam. Electronically Signed   By: Lavonia Dana M.D.   On: 12/30/2017 15:47    Assessment and Plan: This is a 47 y.o. female who presents with abdominal pain, s/p laparoscopic cholecystectomy on 1/30.  I have independently viewed the patient's imaging studies and reviewed her laboratory studies.  Overall, her ultrasound is unremarkable but her CT scan shows a possible collection with air foci in the gallbladder fossa.  However, this is confounded by the fact that Surgicel was left in the same area and can have the same appearance on CT.  Her labs are otherwise normal with a normal WBC though upper end of normal.  Discussed with the patient the confounding findings on CT, knowing that Surgicel was left in the same area for hemostasis.  Her WBC is normal, she's afebrile in ED, and her imaging studies do not show other findings of concern.  Offered the patient admission to the hospital for observation, pain control, and start empiric antibiotics, but the patient preferred to try outpatient management first.  With that in mind, will start the patient on Augmentin to start in the ED tonight and to have a 10 day course.  Will also give her a new prescription for Vicodin 5/325 for pain control.  Return instructions  were given to the patient too.  She will have a close follow up and we'll set her a new appointment for 2/7 to check on her clinical condition.  She'll have a work note until her follow up appointment this week.  Patient understands this plan and all of her questions have been answered.   Melvyn Neth, [place]

## 2017-12-30 NOTE — ED Triage Notes (Signed)
States had GB out Jan 30th, did well post, 2 days started getting severe upper R abd pain, nausea, fevers.

## 2017-12-30 NOTE — ED Notes (Signed)
Pt states tmax 2 days ago 99.0 at home, took advil at home. Reports continued pain and nausea x2days.

## 2017-12-30 NOTE — Progress Notes (Signed)
Outpatient postop visit  12/30/2017  Amy Charles is an 47 y.o. female.    Procedure: Laparoscopic cholecystectomy  CC: Abdominal pain and drainage from wound  HPI: This a patient who underwent a laparoscopic cholecystectomy by Dr. Hampton Abbot about 5 or 6 days ago.  She called this morning stating that she was having abdominal pain 10 out of 10 and was experiencing purulent drainage from her umbilical wound. She had normal liver function tests and a normal white blood cell count on 30 January. Patient describes nausea but no vomiting she is feeling considerable pain on her right side and this is way different than what it was in the immediate postoperative period.  She states that she is much worse now than when she went home.  She drove herself here but is tearful and very uncomfortable appearing she is out of pain medication.  She states she had a fever but by the time she got a thermometer in the house it was 99+ after taking Tylenol.  Medications reviewed.    Physical Exam:  Ht 5\' 5"  (1.651 m)   BMI 30.79 kg/m     PE: Afebrile here in the office Very uncomfortable appearing female patient No icterus no jaundice abdomen is very tender in the right side.  There is some ecchymosis around the periumbilical area.  There appears to be mild erythema signifying a probable suture reaction around each of the incisions from the Vicryl or Monocryl.  No obvious peritoneal signs but much more tender than I would expect at day 6    Assessment/Plan:  This patient appears quite ill and uncomfortable 6 days following laparoscopic cholecystectomy.  She was doing much better in the days following surgery but in the last 2 days she has worsened considerably she is nauseated but has not vomited.  At first I offered outpatient management of this but I believe that she will appears so ill I would like to send her to the emergency room.  I will communicate her condition to the ER so that they can expedite  any sort of workup and Dr. Rosana Hoes who is on the day shift will be called as well.  Florene Glen, MD, FACS

## 2017-12-30 NOTE — Telephone Encounter (Signed)
Patient stating she is in pain level 10- She stating that she has green pus underneath the glue at the midline incision just above her navel She had fever 2 days ago, however has not checked her temperature today but she is having chills. She has a good appetite.  Patient added to schedule today per Dr.Cooper at 1:30 pm.

## 2017-12-31 ENCOUNTER — Telehealth: Payer: Self-pay

## 2017-12-31 LAB — BLOOD CULTURE ID PANEL (REFLEXED)
ACINETOBACTER BAUMANNII: NOT DETECTED
CANDIDA ALBICANS: NOT DETECTED
CANDIDA GLABRATA: NOT DETECTED
CANDIDA KRUSEI: NOT DETECTED
Candida parapsilosis: NOT DETECTED
Candida tropicalis: NOT DETECTED
ENTEROBACTER CLOACAE COMPLEX: NOT DETECTED
ENTEROBACTERIACEAE SPECIES: NOT DETECTED
ESCHERICHIA COLI: NOT DETECTED
Enterococcus species: NOT DETECTED
Haemophilus influenzae: NOT DETECTED
KLEBSIELLA PNEUMONIAE: NOT DETECTED
Klebsiella oxytoca: NOT DETECTED
Listeria monocytogenes: NOT DETECTED
NEISSERIA MENINGITIDIS: NOT DETECTED
PROTEUS SPECIES: NOT DETECTED
PSEUDOMONAS AERUGINOSA: NOT DETECTED
STREPTOCOCCUS AGALACTIAE: NOT DETECTED
STREPTOCOCCUS PNEUMONIAE: NOT DETECTED
Serratia marcescens: NOT DETECTED
Staphylococcus aureus (BCID): NOT DETECTED
Staphylococcus species: NOT DETECTED
Streptococcus pyogenes: NOT DETECTED
Streptococcus species: NOT DETECTED

## 2017-12-31 NOTE — Telephone Encounter (Signed)
Spoke with Benjamine Mola from [facility] lad due to critical culture result- Gram Variable Rod  Spoke with Dr.Davis -patient is currently on Antibiotic per Dr.Piscoya. Patient seen in emergency room 12/30/17.

## 2018-01-02 ENCOUNTER — Telehealth: Payer: Self-pay

## 2018-01-02 ENCOUNTER — Encounter: Payer: Self-pay | Admitting: Surgery

## 2018-01-02 ENCOUNTER — Ambulatory Visit (INDEPENDENT_AMBULATORY_CARE_PROVIDER_SITE_OTHER): Payer: 59 | Admitting: Surgery

## 2018-01-02 ENCOUNTER — Observation Stay
Admission: AD | Admit: 2018-01-02 | Discharge: 2018-01-03 | Disposition: A | Discharge status: Home / Self Care | Payer: 59 | Source: Ambulatory Visit | Attending: Surgery | Admitting: Surgery

## 2018-01-02 ENCOUNTER — Other Ambulatory Visit: Payer: Self-pay

## 2018-01-02 ENCOUNTER — Observation Stay: Payer: 59

## 2018-01-02 VITALS — BP 137/83 | HR 87 | Temp 97.9°F | Ht 65.0 in | Wt 183.8 lb

## 2018-01-02 DIAGNOSIS — F17210 Nicotine dependence, cigarettes, uncomplicated: Secondary | ICD-10-CM | POA: Diagnosis not present

## 2018-01-02 DIAGNOSIS — T8143XA Infection following a procedure, organ and space surgical site, initial encounter: Secondary | ICD-10-CM

## 2018-01-02 DIAGNOSIS — T8149XA Infection following a procedure, other surgical site, initial encounter: Secondary | ICD-10-CM

## 2018-01-02 DIAGNOSIS — Z09 Encounter for follow-up examination after completed treatment for conditions other than malignant neoplasm: Secondary | ICD-10-CM

## 2018-01-02 DIAGNOSIS — R109 Unspecified abdominal pain: Secondary | ICD-10-CM | POA: Diagnosis not present

## 2018-01-02 DIAGNOSIS — Z9049 Acquired absence of other specified parts of digestive tract: Secondary | ICD-10-CM | POA: Diagnosis not present

## 2018-01-02 LAB — COMPREHENSIVE METABOLIC PANEL WITH GFR
ALT: 21 U/L (ref 14–54)
AST: 15 U/L (ref 15–41)
Albumin: 4.3 g/dL (ref 3.5–5.0)
Alkaline Phosphatase: 60 U/L (ref 38–126)
Anion gap: 9 (ref 5–15)
BUN: 15 mg/dL (ref 6–20)
CO2: 25 mmol/L (ref 22–32)
Calcium: 9 mg/dL (ref 8.9–10.3)
Chloride: 104 mmol/L (ref 101–111)
Creatinine, Ser: 0.67 mg/dL (ref 0.44–1.00)
GFR calc Af Amer: >60 mL/min
GFR calc non Af Amer: >60 mL/min
Glucose, Bld: 91 mg/dL (ref 65–99)
Potassium: 3.8 mmol/L (ref 3.5–5.1)
Sodium: 138 mmol/L (ref 135–145)
Total Bilirubin: <0.1 mg/dL — ABNORMAL LOW (ref 0.3–1.2)
Total Protein: 7.5 g/dL (ref 6.5–8.1)

## 2018-01-02 LAB — CBC WITH DIFFERENTIAL/PLATELET
BASOS ABS: 0 10*3/uL (ref 0–0.1)
BASOS PCT: 1 %
Eosinophils Absolute: 0.5 10*3/uL (ref 0–0.7)
Eosinophils Relative: 7 %
HEMATOCRIT: 44.1 % (ref 35.0–47.0)
HEMOGLOBIN: 14.8 g/dL (ref 12.0–16.0)
LYMPHS PCT: 27 %
Lymphs Abs: 1.9 10*3/uL (ref 1.0–3.6)
MCH: 30.5 pg (ref 26.0–34.0)
MCHC: 33.6 g/dL (ref 32.0–36.0)
MCV: 90.7 fL (ref 80.0–100.0)
MONO ABS: 0.5 10*3/uL (ref 0.2–0.9)
Monocytes Relative: 7 %
NEUTROS ABS: 4.1 10*3/uL (ref 1.4–6.5)
NEUTROS PCT: 58 %
Platelets: 314 10*3/uL (ref 150–440)
RBC: 4.86 MIL/uL (ref 3.80–5.20)
RDW: 13.6 % (ref 11.5–14.5)
WBC: 7.1 10*3/uL (ref 3.6–11.0)

## 2018-01-02 LAB — CULTURE, BLOOD (ROUTINE X 2): Special Requests: ADEQUATE

## 2018-01-02 MED ORDER — IOPAMIDOL (ISOVUE-300) INJECTION 61%
15.0000 mL | INTRAVENOUS | Status: AC
  Administered 2018-01-02 (×2): 15 mL via INTRAVENOUS

## 2018-01-02 MED ORDER — POLYETHYLENE GLYCOL 3350 17 G PO PACK: 17.0000 g | PACK | Freq: Every day | ORAL | Status: DC | PRN

## 2018-01-02 MED ORDER — HYDROMORPHONE HCL 1 MG/ML IJ SOLN
0.5000 mg | INTRAMUSCULAR | Status: DC | PRN
  Administered 2018-01-02 – 2018-01-03 (×3): 0.5 mg via INTRAVENOUS
  Filled 2018-01-02 (×3): qty 0.5

## 2018-01-02 MED ORDER — PANTOPRAZOLE SODIUM 40 MG IV SOLR
40.0000 mg | Freq: Every day | INTRAVENOUS | Status: DC
  Administered 2018-01-02: 40 mg via INTRAVENOUS
  Filled 2018-01-02: qty 40

## 2018-01-02 MED ORDER — IOPAMIDOL (ISOVUE-300) INJECTION 61%
100.0000 mL | Freq: Once | INTRAVENOUS | Status: AC | PRN
  Administered 2018-01-02: 100 mL via INTRAVENOUS

## 2018-01-02 MED ORDER — ONDANSETRON HCL 4 MG/2ML IJ SOLN: 4.0000 mg | Freq: Four times a day (QID) | INTRAMUSCULAR | Status: DC | PRN

## 2018-01-02 MED ORDER — ONDANSETRON 4 MG PO TBDP: 4.0000 mg | ORAL_TABLET | Freq: Four times a day (QID) | ORAL | Status: DC | PRN

## 2018-01-02 MED ORDER — ACETAMINOPHEN 500 MG PO TABS
1000.0000 mg | ORAL_TABLET | Freq: Four times a day (QID) | ORAL | Status: DC
  Administered 2018-01-02 – 2018-01-03 (×3): 1000 mg via ORAL
  Filled 2018-01-02 (×3): qty 2

## 2018-01-02 MED ORDER — KETOROLAC TROMETHAMINE 30 MG/ML IJ SOLN
30.0000 mg | Freq: Four times a day (QID) | INTRAMUSCULAR | Status: DC
  Administered 2018-01-02 – 2018-01-03 (×4): 30 mg via INTRAVENOUS
  Filled 2018-01-02 (×4): qty 1

## 2018-01-02 MED ORDER — PIPERACILLIN-TAZOBACTAM 3.375 G IVPB
3.3750 g | Freq: Three times a day (TID) | INTRAVENOUS | Status: DC
  Administered 2018-01-02 – 2018-01-03 (×3): 3.375 g via INTRAVENOUS
  Filled 2018-01-02 (×5): qty 50

## 2018-01-02 MED ORDER — LACTATED RINGERS IV SOLN
125.0000 mL/h | INTRAVENOUS | Status: DC
  Administered 2018-01-02 – 2018-01-03 (×3): 125 mL/h via INTRAVENOUS

## 2018-01-02 MED ORDER — OXYCODONE HCL 5 MG PO TABS
5.0000 mg | ORAL_TABLET | ORAL | Status: DC | PRN
  Administered 2018-01-02: 10 mg via ORAL
  Filled 2018-01-02: qty 2

## 2018-01-02 MED ORDER — ENOXAPARIN SODIUM 40 MG/0.4ML ~~LOC~~ SOLN
40.0000 mg | SUBCUTANEOUS | Status: DC
  Administered 2018-01-02: 40 mg via SUBCUTANEOUS
  Filled 2018-01-02: qty 0.4

## 2018-01-02 NOTE — Progress Notes (Signed)
01/02/2018  HPI: Patient is s/p laparoscopic cholecystectomy on 1/30 for acute cholecystitis.  She presented to the ED on 2/4 with abdominal pain and CT scan showed possible abscess at the liver bed, though Surgicel was left in that area for hemostasis as well.  Her labs were overall normal and was given Augmentin empirically and discharged home.  She presents to office today reports that her pain is better, but she overall does not feel good.  She has been having chills, though no fevers, and had nausea and vomiting of 3 episodes yesterday.  She was able to keep dinner down last night.    Vital signs: BP 137/83   Pulse 87   Temp 97.9 F (36.6 C) (Oral)   Ht 5\' 5"  (1.651 m)   Wt 83.4 kg (183 lb 12.8 oz)   BMI 30.59 kg/m    Physical Exam: Constitutional: No acute distress Abdomen:  Soft, nondistended, with improved tenderness to palpation over incisions.  All incisions are clean, dry, intact without any drainage and without evidence of infection.  Assessment/Plan: 47 yo female s/p laparoscopic cholecystectomy.  Discussed with the patient that due to her not feeling better, would admit her to the hospital for observation.  She did have one of two positive blood culture and will repeat those and get new set of labs.  She'll also get a repeat CT scan to assess the liver bed area for abscess or other etiologies for her pain and vomiting.  Will be changed to IV antibiotics, kept NPO with IV fluid hydration and with appropriate pain and nausea control.  She understands this plan and all of her questions have been answered.   Will discuss with Dr. Rosana Hoes who is the daytime surgeon.   Melvyn Neth, [place]

## 2018-01-02 NOTE — Progress Notes (Signed)
Spoke with Amy Charles Direct admit. Per [place] rooms are full. He will call office when room is available.

## 2018-01-02 NOTE — Patient Instructions (Addendum)
Per Dr.Piscoya-Patient is to be direct admitted.

## 2018-01-02 NOTE — H&P (Signed)
Date of Admission:  01/02/2018  Reason for Admission:  Abdominal pain  History of Present Illness: Amy Charles is a 47 y.o. female s/p laparoscopic cholecystectomy on 1/30 for acute cholecystitis.  She presented to the ED on 2/4 with abdominal pain and CT scan showed possible abscess at the liver bed, though Surgicel was left in that area for hemostasis as well.  Her labs were overall normal and was given Augmentin empirically and discharged home.  She presents to office today reports that her pain is better, but she overall does not feel good.  She has been having chills, though no fevers, and had nausea and vomiting of 3 episodes yesterday.  She was able to keep dinner down last night.   Of note, the patient also had blood cultures in ED, one of two which is showing gram variable rods, but only one bottle.  Past Medical History: Past Medical History:  Diagnosis Date  . Gallstones   . GERD (gastroesophageal reflux disease)   . IBS (irritable bowel syndrome)   . Migraines      Past Surgical History: Past Surgical History:  Procedure Laterality Date  . BREAST BIOPSY    . CHOLECYSTECTOMY N/A 12/25/2017   Procedure: LAPAROSCOPIC CHOLECYSTECTOMY;  Surgeon: Olean Ree, MD;  Location: [facility] ORS;  Service: General;  Laterality: N/A;  . NOVASURE ABLATION    . TUBAL LIGATION      Home Medications: Prior to Admission medications   Medication Sig Start Date End Date Taking? Authorizing Provider  amoxicillin-clavulanate (AUGMENTIN) 875-125 MG tablet Take 1 tablet by mouth 2 (two) times daily. 12/30/17   Delman Kitten, MD  HYDROcodone-acetaminophen (NORCO/VICODIN) 5-325 MG tablet Take 1-2 tablets by mouth every 6 (six) hours as needed for moderate pain. 12/30/17   Olean Ree, MD  ondansetron (ZOFRAN ODT) 4 MG disintegrating tablet Allow 1-2 tablets to dissolve in your mouth every 8 hours as needed for nausea/vomiting 12/18/17   Hinda Kehr, MD    Allergies: Allergies  Allergen Reactions  .  Sumatriptan     convulsions  . Tetracyclines & Related     Social History:  reports that she has been smoking cigarettes.  She has been smoking about 0.50 packs per day. she has never used smokeless tobacco. She reports that she does not drink alcohol or use drugs.   Family History: No family history of gallbladder surgery.  Review of Systems: Review of Systems  Constitutional: Positive for chills. Negative for fever.  HENT: Negative for hearing loss.   Respiratory: Negative for shortness of breath.   Cardiovascular: Negative for chest pain.  Gastrointestinal: Positive for abdominal pain, nausea and vomiting. Negative for constipation and diarrhea.  Genitourinary: Negative for dysuria.  Musculoskeletal: Negative for myalgias.  Skin: Negative for rash.  Neurological: Negative for dizziness.  Psychiatric/Behavioral: Negative for depression.  All other systems reviewed and are negative.   Physical Exam BP 137/83   Pulse 87   Temp 97.9 F (36.6 C) (Oral)   Ht 5\' 5"  (1.651 m)   Wt 83.4 kg (183 lb 12.8 oz)   BMI 30.59 kg/m   CONSTITUTIONAL: No acute distress HEENT:  Normocephalic, atraumatic, extraocular motion intact. NECK: Trachea is midline, and there is no jugular venous distension.  RESPIRATORY:  Lungs are clear, and breath sounds are equal bilaterally. Normal respiratory effort without pathologic use of accessory muscles. CARDIOVASCULAR: Heart is regular without murmurs, gallops, or rubs. GI: The abdomen is soft, nondistended, with improved tenderness to palpation over incisions.  All  incisions are clean, dry, intact without any drainage and without evidence of infection. MUSCULOSKELETAL:  Normal muscle strength and tone in all four extremities.  No peripheral edema or cyanosis. SKIN: Skin turgor is normal. There are no pathologic skin lesions.  NEUROLOGIC:  Motor and sensation is grossly normal.  Cranial nerves are grossly intact. PSYCH:  Alert and oriented to person,  place and time. Affect is normal.  Laboratory Analysis: No results found for this or any previous visit (from the past 24 hour(s)).  Imaging: No results found.  Assessment and Plan: 47 yo female s/p laparoscopic cholecystectomy.  Discussed with the patient that due to her not feeling better, would admit her to the hospital for observation.  She did have one of two positive blood culture and will repeat those and get new set of labs.  She'll also get a repeat CT scan to assess the liver bed area for abscess or other etiologies for her pain and vomiting.  Will be changed to IV antibiotics, kept NPO with IV fluid hydration and with appropriate pain and nausea control.  She understands this plan and all of her questions have been answered.   Will discuss with Dr. Rosana Hoes who is the daytime surgeon.   Melvyn Neth, [place]

## 2018-01-02 NOTE — Telephone Encounter (Signed)
Received call from [facility] patient placement. Have patient check in at admitting.   Spoke with patient at this time. Instructed to go to admitting desk at [facility] and from there room 209.

## 2018-01-03 ENCOUNTER — Telehealth: Payer: Self-pay | Admitting: General Practice

## 2018-01-03 DIAGNOSIS — R109 Unspecified abdominal pain: Secondary | ICD-10-CM | POA: Diagnosis not present

## 2018-01-03 LAB — CBC WITH DIFFERENTIAL/PLATELET
BASOS ABS: 0 10*3/uL (ref 0–0.1)
Basophils Relative: 1 %
EOS ABS: 0.3 10*3/uL (ref 0–0.7)
EOS PCT: 7 %
HCT: 34.5 % — ABNORMAL LOW (ref 35.0–47.0)
HEMOGLOBIN: 11.7 g/dL — AB (ref 12.0–16.0)
LYMPHS ABS: 1.6 10*3/uL (ref 1.0–3.6)
LYMPHS PCT: 33 %
MCH: 30.6 pg (ref 26.0–34.0)
MCHC: 33.9 g/dL (ref 32.0–36.0)
MCV: 90.4 fL (ref 80.0–100.0)
Monocytes Absolute: 0.4 10*3/uL (ref 0.2–0.9)
Monocytes Relative: 9 %
NEUTROS PCT: 50 %
Neutro Abs: 2.4 10*3/uL (ref 1.4–6.5)
PLATELETS: 239 10*3/uL (ref 150–440)
RBC: 3.82 MIL/uL (ref 3.80–5.20)
RDW: 12.9 % (ref 11.5–14.5)
WBC: 4.8 10*3/uL (ref 3.6–11.0)

## 2018-01-03 MED ORDER — PIPERACILLIN-TAZOBACTAM 3.375 G IVPB
3.3750 g | Freq: Once | INTRAVENOUS | Status: AC
  Administered 2018-01-03: 3.375 g via INTRAVENOUS
  Filled 2018-01-03: qty 50

## 2018-01-03 NOTE — Progress Notes (Signed)
Discharge instructions reviewed with patient including medications and followup appointments.  Understanding was verbalized and all questions were answered.  Patient discharged home ambulatory in stable condition.

## 2018-01-03 NOTE — Progress Notes (Signed)
ED CULTURE REPORT   47 yo female presented to ED on 2/4 with complaints of abdominal pain and drainage from wound site s/p laparoscopic cholecystectomy. Blood cultures were obtained and given Augmentin 875/126mg  BID for 10 days. After blood cultures showed presence of bacteria, patient was instructed to come back to hospital to be admitted for IV Abx and further observation. Blood cultures resulted on 2/8 showing Bacillus species. Patient is currently being treated with Zosyn 3.375g Q8H which should cover her infection. Repeat blood cultures from inpatient admit showing NGTD.   Results for orders placed or performed during the hospital encounter of 01/02/18  Culture, blood (routine x 2)     Status: None (Preliminary result)   Collection Time: 01/02/18  1:04 PM  Result Value Ref Range Status   Specimen Description BLOOD Blood Culture adequate volume  Final   Special Requests BLOOD RIGHT HAND  Final   Culture   Final    NO GROWTH < 24 HOURS Performed at [facility], [address], [place], [place] [address]    Report Status PENDING  Incomplete  Culture, blood (routine x 2)     Status: None (Preliminary result)   Collection Time: 01/02/18  1:27 PM  Result Value Ref Range Status   Specimen Description   Final    BLOOD Blood Culture results may not be optimal due to an inadequate volume of blood received in culture bottles   Special Requests LEFT ANTECUBITAL  Final   Culture   Final    NO GROWTH < 24 HOURS Performed at [facility], [address] [place], [place] [address]    Report Status PENDING  Incomplete   Lendon Ka, PharmD Pharmacy Resident

## 2018-01-03 NOTE — Telephone Encounter (Signed)
Patient's calling needing a doctors note sent to her work. Patient said Dr. Rosana Hoes said she could return to back to work on Tuesday, patient is asking if we can fax the letter to her work fax number is [phone], and to put attention to [place].

## 2018-01-03 NOTE — Progress Notes (Signed)
Patient very anxious regarding leaving.  Per Dr. Rosana Hoes needs afternoon dose of Zosyn.  Patient states she cannot wait four hours for dose to infuse.  Spoke with pharmacy and okay to change to 30-min infusion.  Patient tolerated infusion without difficulty and was discharged afterward.

## 2018-01-03 NOTE — Telephone Encounter (Signed)
Return to work note faxed to [place] at [employer].

## 2018-01-04 LAB — CULTURE, BLOOD (ROUTINE X 2)
CULTURE: NO GROWTH
SPECIAL REQUESTS: ADEQUATE

## 2018-01-07 LAB — CULTURE, BLOOD (ROUTINE X 2)
Culture: NO GROWTH
Culture: NO GROWTH
Specimen Description: ADEQUATE

## 2018-01-08 NOTE — Discharge Summary (Signed)
Physician Discharge Summary  Patient ID: Amy Charles MRN: [id number] DOB/AGE: Feb 08, 1971 47 y.o.  Admit date: 01/02/2018 Discharge date: 01/08/2018  Admission Diagnoses:  Discharge Diagnoses:  Active Problems:   Abdominal pain   Discharged Condition: good  Hospital Course: 47 y.o. female s/p laparoscopic cholecystectomy (1/30) for acute cholecystitis presented to [facility] 2/4 for abdominal pain with fever/chills. CT at that time suggested small gallbladder fossa abscess vs hemostatic agent (discussion with operating surgeon confirmed surgicel was left at time of operation). Admission for inpatient antibiotics was offered, but patient instead elected to be discharged home with oral antibiotics. Interestingly, blood cultures performed in ED suggested gram variable rods in 1 of 2 bottles. The following day, however, patient returned to outpatient surgical office, still "not feeling well" with subjective chills without fever. She was direct-admitted and repeat CT was performed, which showed minimally decreased gas at gallbladder fossa, and patient communicated complete resolution of all her presenting symptoms the following day. This was discussed with her operating surgeon, and discharge planning was initiated accordingly with patient safely able to be discharged home with appropriate discharge instructions, antibiotics, and outpatient surgical follow-up after all of her questions were answered to her expressed satisfaction.  Consults: None  Significant Diagnostic Studies: radiology: CT scan: minimally decreased gas at gallbladder fossa  Treatments: antibiotics: Zosyn  Discharge Exam: Blood pressure (!) 155/72, pulse 64, temperature 97.8 F (36.6 C), temperature source Oral, resp. rate 16, height 5\' 5"  (1.651 m), weight 182 lb 5.1 oz (82.7 kg), SpO2 99 %. General appearance: alert, cooperative and no distress GI: abdomen soft and non-distended with minimal peri-incisional tenderness to palpation  without any peri-incisional erythema or drainage  Disposition: 01-Home or Self Care   Allergies as of 01/03/2018      Reactions   Sumatriptan    convulsions   Tetracyclines & Related       Medication List    TAKE these medications   amoxicillin-clavulanate 875-125 MG tablet Commonly known as:  AUGMENTIN Take 1 tablet by mouth 2 (two) times daily.   HYDROcodone-acetaminophen 5-325 MG tablet Commonly known as:  NORCO/VICODIN Take 1-2 tablets by mouth every 6 (six) hours as needed for moderate pain.   ondansetron 4 MG disintegrating tablet Commonly known as:  ZOFRAN ODT Allow 1-2 tablets to dissolve in your mouth every 8 hours as needed for nausea/vomiting      Follow-up [place]. Go on 01/09/2018.   Specialty:  General Surgery Why:  Dr. Hampton Abbot, Thursday, 2/14 at 2:15 p.m.  [phone] Contact information: [place] [address] [place]          Signed: Vickie Epley 01/08/2018, 4:26 PM

## 2018-01-09 ENCOUNTER — Encounter: Payer: Self-pay | Admitting: Surgery

## 2018-01-09 ENCOUNTER — Ambulatory Visit (INDEPENDENT_AMBULATORY_CARE_PROVIDER_SITE_OTHER): Payer: 59 | Admitting: Surgery

## 2018-01-09 ENCOUNTER — Other Ambulatory Visit
Admission: RE | Admit: 2018-01-09 | Discharge: 2018-01-09 | Disposition: A | Discharge status: Home / Self Care | Payer: 59 | Source: Ambulatory Visit | Attending: Surgery | Admitting: Surgery

## 2018-01-09 ENCOUNTER — Encounter: Payer: 59 | Admitting: Surgery

## 2018-01-09 VITALS — BP 139/87 | HR 109 | Temp 97.8°F | Ht 65.0 in | Wt 178.2 lb

## 2018-01-09 DIAGNOSIS — R197 Diarrhea, unspecified: Secondary | ICD-10-CM

## 2018-01-09 DIAGNOSIS — Z09 Encounter for follow-up examination after completed treatment for conditions other than malignant neoplasm: Secondary | ICD-10-CM

## 2018-01-09 DIAGNOSIS — R112 Nausea with vomiting, unspecified: Secondary | ICD-10-CM

## 2018-01-09 MED ORDER — ONDANSETRON HCL 4 MG PO TABS: 4.0000 mg | ORAL_TABLET | Freq: Three times a day (TID) | ORAL | 0 refills | Status: DC | PRN

## 2018-01-09 MED ORDER — CIPROFLOXACIN HCL 500 MG PO TABS: 500.0000 mg | ORAL_TABLET | Freq: Two times a day (BID) | ORAL | 0 refills | Status: DC

## 2018-01-09 NOTE — Progress Notes (Signed)
01/09/2018  HPI: Patient is s/p laparoscopic cholecystectomy on 1/30 for acute cholecystitis.  She presented to the ED on 2/4 with abdominal pain and nausea and noted to have a possible abscess in the RUQ, though could also have been Surgicel used in the operation.  She was admitted to the hospital on 2/7 due to persistent pain and nausea, and also because there was concern for possible bacteremia on blood culture.  She was on IV antibiotics and discharged to home on 2/8.  She now reports her pain is much better and only has some sporadic pain episodes that are very short-lived.  She however has been having more nausea and vomiting and diarrhea recently.  The diarrhea is very frequent to the point that she has had some accidents.  She believes it's related to the antibiotics that she's been taking.  Has been able to stay hydrated but unable to keep much solid po intake.  Vital signs: BP 139/87   Pulse (!) 109   Temp 97.8 F (36.6 C) (Oral)   Ht 5\' 5"  (1.651 m)   Wt 80.8 kg (178 lb 3.2 oz)   BMI 29.65 kg/m    Physical Exam: Constitutional: No acute distress Abdomen:  Soft, nondistended, only with some soreness to palpation.  Incisions are clean, dry, intact, and healing well without evidence of infection.  Assessment/Plan: 47 yo female s/p laparoscopic cholecystectomy.  From the pain standpoint, she has improved considerably but now she has another issue with n/v and diarrhea.  Given that she is on antibiotics, one possible concern could be side effect of the antibiotic, and will change her to Cipro 500 mg BID for 3 more day duration that she has left.  This could also be related to c-diff, and will also order c-diff testing.  Given her nausea/vomiting, will give prescription for zofran.  Based on her c-diff results, she may need to stop abx altogether and start Flagyl or po vanco.  We will update the patient on the results.  She is to also inform us if her diarrhea gets much worse, if she starts  having worse abdominal pain, fever, or chills.   Melvyn Neth, [place]

## 2018-01-09 NOTE — Patient Instructions (Addendum)
We have sent in a new antibiotic for you to start. We have also ordered for you to have a C-Diff test as well at the [facility].  We will see you back in office after we have the results from you C-Diff test.   Please call our office if you have any questions.

## 2018-01-10 ENCOUNTER — Other Ambulatory Visit: Payer: Self-pay | Admitting: Surgery

## 2018-01-10 ENCOUNTER — Other Ambulatory Visit
Admission: RE | Admit: 2018-01-10 | Discharge: 2018-01-10 | Disposition: A | Discharge status: Home / Self Care | Payer: 59 | Source: Ambulatory Visit | Attending: Surgery | Admitting: Surgery

## 2018-01-10 ENCOUNTER — Telehealth: Payer: Self-pay | Admitting: Surgery

## 2018-01-10 DIAGNOSIS — R197 Diarrhea, unspecified: Secondary | ICD-10-CM | POA: Insufficient documentation

## 2018-01-10 LAB — C DIFFICILE QUICK SCREEN W PCR REFLEX
C DIFFICILE (CDIFF) INTERP: NOT DETECTED
C DIFFICLE (CDIFF) ANTIGEN: NEGATIVE
C Diff toxin: NEGATIVE

## 2018-01-10 NOTE — Progress Notes (Signed)
01/10/18  Patient's c-diff testing came back negative.  Called patient to inform of results.  Since changing antibiotic to Cipro, patient reports she's not having any further nausea or vomiting, but still having some diarrhea.  Recommended that she can take Imodium, fiber supplement, and probiotic to help with her diarrhea.  She may call us next week if there is no improvement so she can be seen again.  Augmentin has been added to her list of allergies for nausea and vomiting.   Olean Ree, MD [place]

## 2018-01-10 NOTE — Telephone Encounter (Signed)
01/10/18  Called patient to inform her of her c-diff results.  Tested negative.  Patient is no longer having nausea/vomiting since changing her antibiotics to Cipro.  She is still having diarrhea.  Recommended that she can take Imodium and fiber supplement as well as probiotics to help with her diarrhea.  Given the side-effects of the Augmentin, will place it under her list of allergies with adverse reaction being nausea and vomiting.  Patient may follow up with [place] as needed, particularly she understands to call us next week if her symptoms have not improved.   Melvyn Neth, [place]

## 2018-01-21 ENCOUNTER — Telehealth: Payer: Self-pay | Admitting: Surgery

## 2018-01-21 NOTE — Telephone Encounter (Signed)
Patient is calling and needs a new doctors note letting them not what days the patient was covered. Fax number is [phone]. Please call and advise.

## 2018-01-23 NOTE — Telephone Encounter (Signed)
Letter completed and faxed to [employer] at this time.

## 2018-03-29 ENCOUNTER — Emergency Department
Admission: EM | Admit: 2018-03-29 | Discharge: 2018-03-29 | Disposition: A | Discharge status: Home / Self Care | Payer: 59 | Attending: Emergency Medicine | Admitting: Emergency Medicine

## 2018-03-29 ENCOUNTER — Other Ambulatory Visit: Payer: Self-pay

## 2018-03-29 DIAGNOSIS — F17210 Nicotine dependence, cigarettes, uncomplicated: Secondary | ICD-10-CM | POA: Insufficient documentation

## 2018-03-29 DIAGNOSIS — Z9049 Acquired absence of other specified parts of digestive tract: Secondary | ICD-10-CM | POA: Insufficient documentation

## 2018-03-29 DIAGNOSIS — R42 Dizziness and giddiness: Secondary | ICD-10-CM | POA: Diagnosis not present

## 2018-03-29 DIAGNOSIS — E876 Hypokalemia: Secondary | ICD-10-CM

## 2018-03-29 LAB — CBC
HCT: 44.2 % (ref 35.0–47.0)
HEMOGLOBIN: 15.1 g/dL (ref 12.0–16.0)
MCH: 30.9 pg (ref 26.0–34.0)
MCHC: 34.2 g/dL (ref 32.0–36.0)
MCV: 90.5 fL (ref 80.0–100.0)
Platelets: 214 10*3/uL (ref 150–440)
RBC: 4.88 MIL/uL (ref 3.80–5.20)
RDW: 13.6 % (ref 11.5–14.5)
WBC: 8.5 10*3/uL (ref 3.6–11.0)

## 2018-03-29 LAB — TROPONIN I: Troponin I: <0.03 ng/mL (ref <0.03)

## 2018-03-29 LAB — BASIC METABOLIC PANEL
ANION GAP: 11 (ref 5–15)
BUN: 8 mg/dL (ref 6–20)
CALCIUM: 8.7 mg/dL — AB (ref 8.9–10.3)
CHLORIDE: 103 mmol/L (ref 101–111)
CO2: 23 mmol/L (ref 22–32)
CREATININE: 0.87 mg/dL (ref 0.44–1.00)
GFR calc non Af Amer: >60 mL/min (ref >60)
GLUCOSE: 137 mg/dL — AB (ref 65–99)
Potassium: 3 mmol/L — ABNORMAL LOW (ref 3.5–5.1)
Sodium: 137 mmol/L (ref 135–145)

## 2018-03-29 MED ORDER — POTASSIUM CHLORIDE ER 10 MEQ PO TBCR: 10.0000 meq | EXTENDED_RELEASE_TABLET | Freq: Every day | ORAL | 0 refills | Status: DC

## 2018-03-29 MED ORDER — POTASSIUM CHLORIDE CRYS ER 20 MEQ PO TBCR
40.0000 meq | EXTENDED_RELEASE_TABLET | Freq: Once | ORAL | Status: AC
  Administered 2018-03-29: 40 meq via ORAL
  Filled 2018-03-29: qty 2

## 2018-03-29 NOTE — ED Triage Notes (Signed)
First Nurse Note:  Arrives to ED with C/O 3 day history of dizziness and chest pain x 1 day. Seen through [facility] earlier today, and patient referred to ED for evaluation.  Patient denies current complaint, is AAOx3.  Skin warm and dry.  No SOB/ DOE.  NAD.

## 2018-03-29 NOTE — ED Triage Notes (Signed)
Pt states dizziness for one week. Pt states [facility] was [facility] at [facility] and told to come to ed for further evaluation. Pt appears in no acute distress, denies pain.

## 2018-03-29 NOTE — ED Provider Notes (Signed)
[facility] Emergency Department Provider Note       Time seen: ----------------------------------------- 10:37 PM on 03/29/2018 -----------------------------------------   I have reviewed the triage vital signs and the nursing notes.  HISTORY   Chief Complaint Dizziness    HPI Amy Charles is a 47 y.o. female with a history of gallstones, GERD, IBS, migraines who presents to the ED for dizziness.  By dizziness patient states she feels like she may pass out.  She has had these symptoms for the last week, she was [facility] at [facility] and told to come to the ER for evaluation.  She was seen this past week there and placed on meclizine which she states is not helping.  She denies fevers, chills, current chest pain, shortness of breath, vomiting or diarrhea.  Past Medical History:  Diagnosis Date  . Gallstones   . GERD (gastroesophageal reflux disease)   . IBS (irritable bowel syndrome)   . Migraines     Patient Active Problem List   Diagnosis Date Noted  . Abdominal pain 01/02/2018  . Right sided abdominal pain   . Status post cholecystectomy   . Cholecystitis, acute 12/25/2017  . Cholecystitis 12/24/2017  . Chronic daily headache 05/18/2012    Past Surgical History:  Procedure Laterality Date  . BREAST BIOPSY    . CHOLECYSTECTOMY N/A 12/25/2017   Procedure: LAPAROSCOPIC CHOLECYSTECTOMY;  Surgeon: Olean Ree, MD;  Location: [facility] ORS;  Service: General;  Laterality: N/A;  . NOVASURE ABLATION    . TUBAL LIGATION      Allergies Augmentin [amoxicillin-pot clavulanate]; Sumatriptan; and Tetracyclines & related  Social History Social History   Tobacco Use  . Smoking status: Current Every Day Smoker    Packs/day: 0.50    Types: Cigarettes  . Smokeless tobacco: Never Used  Substance Use Topics  . Alcohol use: No  . Drug use: No   Review of Systems Constitutional: Negative for fever. Cardiovascular: Negative for chest pain. Respiratory:  Negative for shortness of breath. Gastrointestinal: Negative for abdominal pain, vomiting and diarrhea. Musculoskeletal: Negative for back pain. Skin: Negative for rash. Neurological: Positive for dizziness  All systems negative/normal/unremarkable except as stated in the HPI  ____________________________________________   PHYSICAL EXAM:  VITAL SIGNS: ED Triage Vitals  Enc Vitals Group     BP 03/29/18 1855 (!) 169/100     Pulse Rate 03/29/18 1855 (!) 104     Resp 03/29/18 1855 16     Temp 03/29/18 1855 98.9 F (37.2 C)     Temp Source 03/29/18 1855 Oral     SpO2 03/29/18 1855 96 %     Weight 03/29/18 1856 179 lb (81.2 kg)     Height 03/29/18 1856 5\' 5"  (1.651 m)     Head Circumference --      Peak Flow --      Pain Score 03/29/18 1856 0     Pain Loc --      Pain Edu? --      Excl. in [place]? --    Constitutional: Alert and oriented. Well appearing and in no distress. Eyes: Conjunctivae are normal. Normal extraocular movements. ENT   Head: Normocephalic and atraumatic.   Nose: No congestion/rhinnorhea.   Mouth/Throat: Mucous membranes are moist.   Neck: No stridor. Cardiovascular: Normal rate, regular rhythm. No murmurs, rubs, or gallops. Respiratory: Normal respiratory effort without tachypnea nor retractions. Breath sounds are clear and equal bilaterally. No wheezes/rales/rhonchi. Gastrointestinal: Soft and nontender. Normal bowel sounds Musculoskeletal: Nontender with  normal range of motion in extremities. No lower extremity tenderness nor edema. Neurologic:  Normal speech and language. No gross focal neurologic deficits are appreciated.  Skin:  Skin is warm, dry and intact. No rash noted. Psychiatric: Mood and affect are normal. Speech and behavior are normal.  ____________________________________________  EKG: Interpreted by me.  Sinus rhythm the rate of 9 bpm, normal PR interval, normal QRS, normal QT.  ____________________________________________  ED  COURSE:  As part of my medical decision making, I reviewed the following data within the [facility] History obtained from family if available, nursing notes, old chart and ekg, as well as notes from prior ED visits. Patient presented for dizziness, we will assess with labs and imaging as indicated at this time. Clinical Course as of Mar 29 2236  Sat Mar 29, 2018  2233 Pt not orthostatic on my examination   [JW]    Clinical Course User Index [JW] Earleen Newport, MD   Procedures ____________________________________________   LABS (pertinent positives/negatives)  Labs Reviewed  BASIC METABOLIC PANEL - Abnormal; Notable for the following components:      Result Value   Potassium 3.0 (*)    Glucose, Bld 137 (*)    Calcium 8.7 (*)    All other components within normal limits  CBC  TROPONIN I  URINALYSIS, COMPLETE (UACMP) WITH MICROSCOPIC  ____________________________________________  DIFFERENTIAL DIAGNOSIS   Dehydration, electrolyte abnormality, near syncope, arrhythmia, MI unlikely  FINAL ASSESSMENT AND PLAN  Dizziness   Plan: The patient had presented for dizziness. Patient's labs did reveal mild hypokalemia of uncertain etiology.  She was not orthostatic on my examination, I did give her a dose of potassium.  Otherwise she is cleared for outpatient follow-up with cardiology referral.   Laurence Aly, MD   Note: This note was generated in part or whole with voice recognition software. Voice recognition is usually quite accurate but there are transcription errors that can and very often do occur. I apologize for any typographical errors that were not detected and corrected.     Earleen Newport, MD 03/29/18 2242

## 2018-03-29 NOTE — ED Notes (Signed)
Orthostatics obtained by dr Jimmye Norman.

## 2018-04-02 ENCOUNTER — Ambulatory Visit (INDEPENDENT_AMBULATORY_CARE_PROVIDER_SITE_OTHER): Payer: 59 | Admitting: Emergency Medicine

## 2018-04-02 ENCOUNTER — Encounter: Payer: Self-pay | Admitting: Emergency Medicine

## 2018-04-02 VITALS — BP 150/84 | HR 114 | Temp 98.2°F | Resp 17 | Ht 64.5 in | Wt 178.0 lb

## 2018-04-02 DIAGNOSIS — R519 Headache, unspecified: Secondary | ICD-10-CM

## 2018-04-02 DIAGNOSIS — R51 Headache: Secondary | ICD-10-CM

## 2018-04-02 DIAGNOSIS — R42 Dizziness and giddiness: Secondary | ICD-10-CM | POA: Diagnosis not present

## 2018-04-02 DIAGNOSIS — G8929 Other chronic pain: Secondary | ICD-10-CM | POA: Insufficient documentation

## 2018-04-02 NOTE — Progress Notes (Signed)
BP Readings from Last 3 Encounters:  04/02/18 (!) 169/109  03/29/18 138/85  01/09/18 139/87   Amy Charles 47 y.o.   Chief Complaint  Patient presents with  . Dizziness    HISTORY OF PRESENT ILLNESS: This is a 47 y.o. female complaining of intermittent dizziness for the past 2 to 3 weeks.  Was seen in the emergency room on 03/29/2018 for the same.  Found to have low potassium and PVCs on EKG.  Referred to cardiologist.  Not orthostatic.  Failed on meclizine that was prescribed earlier from an urgent care center.  No changes in her lifestyle.  Positive smoker.  [place] abuser.  Sleeps 4 to 5 hours a night.  No additional stress in her life.  No specific triggers identified.  Denies any recent flulike illness.  Has a history of migraine with frequent headaches.  On no new medications or OTC supplements.  Denies fever or chills.  Denies nausea or vomiting.  At times balance feels a little off but no motion sickness while in the car.  Denies any other significant symptoms.  HPI   Prior to Admission medications   Medication Sig Start Date End Date Taking? Authorizing Provider  alprazolam Duanne Moron) 2 MG tablet Take 2 mg by mouth at bedtime as needed for sleep.   Yes [provider]  amphetamine-dextroamphetamine (ADDERALL) 20 MG tablet Take 20 mg by mouth daily.   Yes [provider]  levocetirizine (XYZAL) 5 MG tablet Take 5 mg by mouth every evening.   Yes [provider]  potassium chloride (K-DUR) 10 MEQ tablet Take 1 tablet (10 mEq total) by mouth daily. 03/29/18  Yes Earleen Newport, MD    Allergies  Allergen Reactions  . Augmentin [Amoxicillin-Pot Clavulanate] Nausea And Vomiting  . Sumatriptan     convulsions  . Tetracyclines & Related     Patient Active Problem List   Diagnosis Date Noted  . Abdominal pain 01/02/2018  . Right sided abdominal pain   . Status post cholecystectomy   . Cholecystitis, acute 12/25/2017  . Cholecystitis 12/24/2017  .  Chronic daily headache 05/18/2012    Past Medical History:  Diagnosis Date  . Allergy   . Anxiety   . Cancer ([place])   . Depression   . Gallstones   . GERD (gastroesophageal reflux disease)   . IBS (irritable bowel syndrome)   . Migraines     Past Surgical History:  Procedure Laterality Date  . BREAST BIOPSY    . CHOLECYSTECTOMY N/A 12/25/2017   Procedure: LAPAROSCOPIC CHOLECYSTECTOMY;  Surgeon: Olean Ree, MD;  Location: [facility] ORS;  Service: General;  Laterality: N/A;  . NOVASURE ABLATION    . TUBAL LIGATION      Social History   Socioeconomic History  . Marital status: Married    Spouse name: Not on file  . Number of children: Not on file  . Years of education: Not on file  . Highest education level: Not on file  Occupational History  . Not on file  Social Needs  . Financial resource strain: Not on file  . Food insecurity:    Worry: Not on file    Inability: Not on file  . Transportation needs:    Medical: Not on file    Non-medical: Not on file  Tobacco Use  . Smoking status: Current Every Day Smoker    Packs/day: 0.50    Years: 30.00    Pack years: 15.00    Types: Cigarettes  .  Smokeless tobacco: Never Used  . Tobacco comment: off/on  Substance and Sexual Activity  . Alcohol use: No  . Drug use: No  . Sexual activity: Not on file  Lifestyle  . Physical activity:    Days per week: Not on file    Minutes per session: Not on file  . Stress: Not on file  Relationships  . Social connections:    Talks on phone: Not on file    Gets together: Not on file    Attends religious service: Not on file    Active member of club or organization: Not on file    Attends meetings of clubs or organizations: Not on file    Relationship status: Not on file  . Intimate partner violence:    Fear of current or ex partner: Not on file    Emotionally abused: Not on file    Physically abused: Not on file    Forced sexual activity: Not on file  Other Topics Concern  .  Not on file  Social History Narrative  . Not on file    Family History  Problem Relation Age of Onset  . Diabetes Mother   . Heart disease Mother   . Hyperlipidemia Mother   . Hypertension Mother   . Heart disease Father   . Hyperlipidemia Father   . Hypertension Father   . Cancer Father   . Hyperlipidemia Brother   . Hypertension Brother   . Hyperlipidemia Brother   . Hypertension Brother      Review of Systems  Constitutional: Negative.  Negative for chills, fever and malaise/fatigue.  HENT: Negative.  Negative for congestion, ear pain, hearing loss, nosebleeds, sinus pain and sore throat.   Eyes: Negative.  Negative for blurred vision, double vision, discharge and redness.  Respiratory: Negative.  Negative for cough, hemoptysis, shortness of breath and wheezing.   Cardiovascular: Negative.  Negative for chest pain, palpitations and leg swelling.  Gastrointestinal: Negative.  Negative for abdominal pain, blood in stool, diarrhea, melena, nausea and vomiting.  Genitourinary: Negative.  Negative for dysuria, hematuria and urgency.  Musculoskeletal: Negative.  Negative for back pain, joint pain, myalgias and neck pain.  Skin: Negative.  Negative for rash.  Neurological: Positive for dizziness and headaches (chronic). Negative for speech change, focal weakness, seizures, loss of consciousness and weakness.  Endo/Heme/Allergies: Negative.   All other systems reviewed and are negative.   Vitals:   04/02/18 1718 04/02/18 1752  BP: (!) 169/109 (!) 150/84  Pulse: (!) 114   Resp: 17   Temp: 98.2 F (36.8 C)   SpO2: 98%      Physical Exam  Constitutional: She is oriented to person, place, and time. She appears well-developed and well-nourished.  HENT:  Head: Normocephalic.  Right Ear: Tympanic membrane, external ear and ear canal normal.  Left Ear: Tympanic membrane, external ear and ear canal normal.  Nose: Nose normal.  Mouth/Throat: Oropharynx is clear and moist.   Eyes: Pupils are equal, round, and reactive to light. Conjunctivae and EOM are normal.  Neck: Normal range of motion. Neck supple. No JVD present. No thyromegaly present.  Cardiovascular: Normal rate, regular rhythm, normal heart sounds and intact distal pulses.  Pulmonary/Chest: Effort normal and breath sounds normal.  Abdominal: Soft. Bowel sounds are normal. She exhibits no distension. There is no tenderness.  Musculoskeletal: Normal range of motion. She exhibits no edema or tenderness.  Lymphadenopathy:    She has no cervical adenopathy.  Neurological: She is alert and  oriented to person, place, and time. She displays normal reflexes. No cranial nerve deficit or sensory deficit. She exhibits normal muscle tone. Coordination normal.  Skin: Skin is warm and dry. Capillary refill takes less than 2 seconds. No rash noted.  Psychiatric: She has a normal mood and affect. Her behavior is normal.  Vitals reviewed.    ASSESSMENT & PLAN: Amy Charles was seen today for dizziness.  Diagnoses and all orders for this visit:  Dizziness -     CBC with Differential/Platelet -     Comprehensive metabolic panel -     Thyroid Profile -     Sedimentation Rate -     ANA,IFA RA Diag Pnl w/rflx Tit/Patn -     HIV antibody -     Ambulatory referral to Neurology -     Ambulatory referral to Cardiology  Chronic nonintractable headache, unspecified headache type -     MR Brain W Wo Contrast; Future   Patient Instructions       IF you received an x-ray today, you will receive an invoice from [facility]. Please contact [facility] at [phone] with questions or concerns regarding your invoice.   IF you received labwork today, you will receive an invoice from [facility]. Please contact [facility] at [phone] with questions or concerns regarding your invoice.   Our billing staff will not be able to assist you with questions regarding bills from these companies.  You will be  contacted with the lab results as soon as they are available. The fastest way to get your results is to activate your My Chart account. Instructions are located on the last page of this paperwork. If you have not heard from [place] regarding the results in 2 weeks, please contact this office.     Dizziness Dizziness is a common problem. It makes you feel unsteady or light-headed. You may feel like you are about to pass out (faint). Dizziness can lead to getting hurt if you stumble or fall. Dizziness can be caused by many things, including:  Medicines.  Not having enough water in your body (dehydration).  Illness.  Follow these instructions at home: Eating and drinking  Drink enough fluid to keep your pee (urine) clear or pale yellow. This helps to keep you from getting dehydrated. Try to drink more clear fluids, such as water.  Do not drink alcohol.  Limit how much caffeine you drink or eat, if your doctor tells you to do that.  Limit how much salt (sodium) you drink or eat, if your doctor tells you to do that. Activity  Avoid making quick movements. ? When you stand up from sitting in a chair, steady yourself until you feel okay. ? In the morning, first sit up on the side of the bed. When you feel okay, stand slowly while you hold onto something. Do this until you know that your balance is fine.  If you need to stand in one place for a long time, move your legs often. Tighten and relax the muscles in your legs while you are standing.  Do not drive or use heavy machinery if you feel dizzy.  Avoid bending down if you feel dizzy. Place items in your home so you can reach them easily without leaning over. Lifestyle  Do not use any products that contain nicotine or tobacco, such as cigarettes and e-cigarettes. If you need help quitting, ask your doctor.  Try to lower your stress level. You can do this by using methods such  as yoga or meditation. Talk with your doctor if you need  help. General instructions  Watch your dizziness for any changes.  Take over-the-counter and prescription medicines only as told by your doctor. Talk with your doctor if you think that you are dizzy because of a medicine that you are taking.  Tell a friend or a family member that you are feeling dizzy. If he or she notices any changes in your behavior, have this person call your doctor.  Keep all follow-up visits as told by your doctor. This is important. Contact a doctor if:  Your dizziness does not go away.  Your dizziness or light-headedness gets worse.  You feel sick to your stomach (nauseous).  You have trouble hearing.  You have new symptoms.  You are unsteady on your feet.  You feel like the room is spinning. Get help right away if:  You throw up (vomit) or have watery poop (diarrhea), and you cannot eat or drink anything.  You have trouble: ? Talking. ? Walking. ? Swallowing. ? Using your arms, hands, or legs.  You feel generally weak.  You are not thinking clearly, or you have trouble forming sentences. A friend or family member may notice this.  You have: ? Chest pain. ? Pain in your belly (abdomen). ? Shortness of breath. ? Sweating.  Your vision changes.  You are bleeding.  You have a very bad headache.  You have neck pain or a stiff neck.  You have a fever. These symptoms may be an emergency. Do not wait to see if the symptoms will go away. Get medical help right away. Call your local emergency services (911 in the [place] Do not drive yourself to the hospital. Summary  Dizziness makes you feel unsteady or light-headed. You may feel like you are about to pass out (faint).  Drink enough fluid to keep your pee (urine) clear or pale yellow. Do not drink alcohol.  Avoid making quick movements if you feel dizzy.  Watch your dizziness for any changes. This information is not intended to replace advice given to you by your health care provider.  Make sure you discuss any questions you have with your health care provider. Document Released: 11/01/2011 Document Revised: 11/29/2016 Document Reviewed: 11/29/2016 Elsevier Interactive Patient Education  2017 Elsevier Inc.      Agustina Caroli, MD [facility]

## 2018-04-02 NOTE — Patient Instructions (Addendum)
   IF you received an x-ray today, you will receive an invoice from [facility]. Please contact [facility] at [phone] with questions or concerns regarding your invoice.   IF you received labwork today, you will receive an invoice from [facility]. Please contact [facility] at [phone] with questions or concerns regarding your invoice.   Our billing staff will not be able to assist you with questions regarding bills from these companies.  You will be contacted with the lab results as soon as they are available. The fastest way to get your results is to activate your My Chart account. Instructions are located on the last page of this paperwork. If you have not heard from us regarding the results in 2 weeks, please contact this office.    Dizziness Dizziness is a common problem. It makes you feel unsteady or light-headed. You may feel like you are about to pass out (faint). Dizziness can lead to getting hurt if you stumble or fall. Dizziness can be caused by many things, including:  Medicines.  Not having enough water in your body (dehydration).  Illness.  Follow these instructions at home: Eating and drinking  Drink enough fluid to keep your pee (urine) clear or pale yellow. This helps to keep you from getting dehydrated. Try to drink more clear fluids, such as water.  Do not drink alcohol.  Limit how much caffeine you drink or eat, if your doctor tells you to do that.  Limit how much salt (sodium) you drink or eat, if your doctor tells you to do that. Activity  Avoid making quick movements. ? When you stand up from sitting in a chair, steady yourself until you feel okay. ? In the morning, first sit up on the side of the bed. When you feel okay, stand slowly while you hold onto something. Do this until you know that your balance is fine.  If you need to stand in one place for a long time, move your legs often. Tighten and relax the muscles in your legs  while you are standing.  Do not drive or use heavy machinery if you feel dizzy.  Avoid bending down if you feel dizzy. Place items in your home so you can reach them easily without leaning over. Lifestyle  Do not use any products that contain nicotine or tobacco, such as cigarettes and e-cigarettes. If you need help quitting, ask your doctor.  Try to lower your stress level. You can do this by using methods such as yoga or meditation. Talk with your doctor if you need help. General instructions  Watch your dizziness for any changes.  Take over-the-counter and prescription medicines only as told by your doctor. Talk with your doctor if you think that you are dizzy because of a medicine that you are taking.  Tell a friend or a family member that you are feeling dizzy. If he or she notices any changes in your behavior, have this person call your doctor.  Keep all follow-up visits as told by your doctor. This is important. Contact a doctor if:  Your dizziness does not go away.  Your dizziness or light-headedness gets worse.  You feel sick to your stomach (nauseous).  You have trouble hearing.  You have new symptoms.  You are unsteady on your feet.  You feel like the room is spinning. Get help right away if:  You throw up (vomit) or have watery poop (diarrhea), and you cannot eat or drink anything.  You have trouble: ?   Talking. ? Walking. ? Swallowing. ? Using your arms, hands, or legs.  You feel generally weak.  You are not thinking clearly, or you have trouble forming sentences. A friend or family member may notice this.  You have: ? Chest pain. ? Pain in your belly (abdomen). ? Shortness of breath. ? Sweating.  Your vision changes.  You are bleeding.  You have a very bad headache.  You have neck pain or a stiff neck.  You have a fever. These symptoms may be an emergency. Do not wait to see if the symptoms will go away. Get medical help right away. Call  your local emergency services (911 in the [place] Do not drive yourself to the hospital. Summary  Dizziness makes you feel unsteady or light-headed. You may feel like you are about to pass out (faint).  Drink enough fluid to keep your pee (urine) clear or pale yellow. Do not drink alcohol.  Avoid making quick movements if you feel dizzy.  Watch your dizziness for any changes. This information is not intended to replace advice given to you by your health care provider. Make sure you discuss any questions you have with your health care provider. Document Released: 11/01/2011 Document Revised: 11/29/2016 Document Reviewed: 11/29/2016 Elsevier Interactive Patient Education  2017 [employer] Inc.  

## 2018-04-03 ENCOUNTER — Encounter: Payer: Self-pay | Admitting: Neurology

## 2018-04-04 LAB — CBC WITH DIFFERENTIAL/PLATELET
BASOS ABS: 0.1 10*3/uL (ref 0.0–0.2)
BASOS: 1 %
EOS (ABSOLUTE): 0.2 10*3/uL (ref 0.0–0.4)
Eos: 3 %
Hematocrit: 42.5 % (ref 34.0–46.6)
Hemoglobin: 14.9 g/dL (ref 11.1–15.9)
IMMATURE GRANS (ABS): 0 10*3/uL (ref 0.0–0.1)
IMMATURE GRANULOCYTES: 0 %
LYMPHS: 26 %
Lymphocytes Absolute: 2 10*3/uL (ref 0.7–3.1)
MCH: 30.6 pg (ref 26.6–33.0)
MCHC: 35.1 g/dL (ref 31.5–35.7)
MCV: 87 fL (ref 79–97)
MONOCYTES: 6 %
Monocytes Absolute: 0.5 10*3/uL (ref 0.1–0.9)
NEUTROS PCT: 64 %
Neutrophils Absolute: 4.8 10*3/uL (ref 1.4–7.0)
PLATELETS: 276 10*3/uL (ref 150–379)
RBC: 4.87 x10E6/uL (ref 3.77–5.28)
RDW: 14 % (ref 12.3–15.4)
WBC: 7.5 10*3/uL (ref 3.4–10.8)

## 2018-04-04 LAB — COMPREHENSIVE METABOLIC PANEL
ALT: 26 IU/L (ref 0–32)
AST: 13 IU/L (ref 0–40)
Albumin/Globulin Ratio: 2.5 — ABNORMAL HIGH (ref 1.2–2.2)
Albumin: 4.9 g/dL (ref 3.5–5.5)
Alkaline Phosphatase: 79 IU/L (ref 39–117)
BUN/Creatinine Ratio: 14 (ref 9–23)
BUN: 10 mg/dL (ref 6–24)
Bilirubin Total: <0.2 mg/dL (ref 0.0–1.2)
CALCIUM: 9.5 mg/dL (ref 8.7–10.2)
CO2: 23 mmol/L (ref 20–29)
CREATININE: 0.74 mg/dL (ref 0.57–1.00)
Chloride: 102 mmol/L (ref 96–106)
GFR calc Af Amer: 112 mL/min/{1.73_m2} (ref >59)
GFR, EST NON AFRICAN AMERICAN: 97 mL/min/{1.73_m2} (ref >59)
GLUCOSE: 106 mg/dL — AB (ref 65–99)
Globulin, Total: 2 g/dL (ref 1.5–4.5)
Potassium: 3.6 mmol/L (ref 3.5–5.2)
Sodium: 142 mmol/L (ref 134–144)
TOTAL PROTEIN: 6.9 g/dL (ref 6.0–8.5)

## 2018-04-04 LAB — THYROID PANEL
FREE THYROXINE INDEX: 2.2 (ref 1.2–4.9)
T3 Uptake Ratio: 24 % (ref 24–39)
T4, Total: 9.1 ug/dL (ref 4.5–12.0)

## 2018-04-04 LAB — SEDIMENTATION RATE: SED RATE: 7 mm/h (ref 0–32)

## 2018-04-04 LAB — ANA,IFA RA DIAG PNL W/RFLX TIT/PATN
ANA Titer 1: NEGATIVE
Cyclic Citrullin Peptide Ab: 5 units (ref 0–19)

## 2018-04-04 LAB — HIV ANTIBODY (ROUTINE TESTING W REFLEX): HIV Screen 4th Generation wRfx: NONREACTIVE

## 2018-04-07 ENCOUNTER — Encounter: Payer: Self-pay | Admitting: *Deleted

## 2018-04-07 ENCOUNTER — Encounter: Payer: Self-pay | Admitting: Emergency Medicine

## 2018-04-09 ENCOUNTER — Other Ambulatory Visit: Payer: Self-pay

## 2018-04-09 ENCOUNTER — Ambulatory Visit (INDEPENDENT_AMBULATORY_CARE_PROVIDER_SITE_OTHER): Payer: 59 | Admitting: Emergency Medicine

## 2018-04-09 ENCOUNTER — Encounter: Payer: Self-pay | Admitting: Emergency Medicine

## 2018-04-09 VITALS — BP 150/94 | HR 88 | Temp 98.5°F | Ht 64.25 in | Wt 179.8 lb

## 2018-04-09 DIAGNOSIS — I10 Essential (primary) hypertension: Secondary | ICD-10-CM

## 2018-04-09 DIAGNOSIS — R42 Dizziness and giddiness: Secondary | ICD-10-CM | POA: Diagnosis not present

## 2018-04-09 MED ORDER — LISINOPRIL 20 MG PO TABS: 20.0000 mg | ORAL_TABLET | Freq: Every day | ORAL | 3 refills | Status: DC

## 2018-04-09 MED ORDER — LEVOCETIRIZINE DIHYDROCHLORIDE 5 MG PO TABS: 5.0000 mg | ORAL_TABLET | Freq: Every evening | ORAL | 3 refills | Status: DC

## 2018-04-09 NOTE — Patient Instructions (Addendum)
   IF you received an x-ray today, you will receive an invoice from [facility]. Please contact [facility] at [phone] with questions or concerns regarding your invoice.   IF you received labwork today, you will receive an invoice from [facility]. Please contact [facility] at [phone] with questions or concerns regarding your invoice.   Our billing staff will not be able to assist you with questions regarding bills from these companies.  You will be contacted with the lab results as soon as they are available. The fastest way to get your results is to activate your My Chart account. Instructions are located on the last page of this paperwork. If you have not heard from us regarding the results in 2 weeks, please contact this office.     Hypertension Hypertension, commonly called high blood pressure, is when the force of blood pumping through the arteries is too strong. The arteries are the blood vessels that carry blood from the heart throughout the body. Hypertension forces the heart to work harder to pump blood and may cause arteries to become narrow or stiff. Having untreated or uncontrolled hypertension can cause heart attacks, strokes, kidney disease, and other problems. A blood pressure reading consists of a higher number over a lower number. Ideally, your blood pressure should be below 120/80. The first ("top") number is called the systolic pressure. It is a measure of the pressure in your arteries as your heart beats. The second ("bottom") number is called the diastolic pressure. It is a measure of the pressure in your arteries as the heart relaxes. What are the causes? The cause of this condition is not known. What increases the risk? Some risk factors for high blood pressure are under your control. Others are not. Factors you can change  Smoking.  Having type 2 diabetes mellitus, high cholesterol, or both.  Not getting enough exercise or physical  activity.  Being overweight.  Having too much fat, sugar, calories, or salt (sodium) in your diet.  Drinking too much alcohol. Factors that are difficult or impossible to change  Having chronic kidney disease.  Having a family history of high blood pressure.  Age. Risk increases with age.  Race. You may be at higher risk if you are African-American.  Gender. Men are at higher risk than women before age 45. After age 65, women are at higher risk than men.  Having obstructive sleep apnea.  Stress. What are the signs or symptoms? Extremely high blood pressure (hypertensive crisis) may cause:  Headache.  Anxiety.  Shortness of breath.  Nosebleed.  Nausea and vomiting.  Severe chest pain.  Jerky movements you cannot control (seizures).  How is this diagnosed? This condition is diagnosed by measuring your blood pressure while you are seated, with your arm resting on a surface. The cuff of the blood pressure monitor will be placed directly against the skin of your upper arm at the level of your heart. It should be measured at least twice using the same arm. Certain conditions can cause a difference in blood pressure between your right and left arms. Certain factors can cause blood pressure readings to be lower or higher than normal (elevated) for a short period of time:  When your blood pressure is higher when you are in a health care provider's office than when you are at home, this is called white coat hypertension. Most people with this condition do not need medicines.  When your blood pressure is higher at home than when you   are in a health care provider's office, this is called masked hypertension. Most people with this condition may need medicines to control blood pressure.  If you have a high blood pressure reading during one visit or you have normal blood pressure with other risk factors:  You may be asked to return on a different day to have your blood pressure  checked again.  You may be asked to monitor your blood pressure at home for 1 week or longer.  If you are diagnosed with hypertension, you may have other blood or imaging tests to help your health care provider understand your overall risk for other conditions. How is this treated? This condition is treated by making healthy lifestyle changes, such as eating healthy foods, exercising more, and reducing your alcohol intake. Your health care provider may prescribe medicine if lifestyle changes are not enough to get your blood pressure under control, and if:  Your systolic blood pressure is above 130.  Your diastolic blood pressure is above 80.  Your personal target blood pressure may vary depending on your medical conditions, your age, and other factors. Follow these instructions at home: Eating and drinking  Eat a diet that is high in fiber and potassium, and low in sodium, added sugar, and fat. An example eating plan is called the DASH (Dietary Approaches to Stop Hypertension) diet. To eat this way: ? Eat plenty of fresh fruits and vegetables. Try to fill half of your plate at each meal with fruits and vegetables. ? Eat whole grains, such as whole wheat pasta, brown rice, or whole grain bread. Fill about one quarter of your plate with whole grains. ? Eat or drink low-fat dairy products, such as skim milk or low-fat yogurt. ? Avoid fatty cuts of meat, processed or cured meats, and poultry with skin. Fill about one quarter of your plate with lean proteins, such as fish, chicken without skin, beans, eggs, and tofu. ? Avoid premade and processed foods. These tend to be higher in sodium, added sugar, and fat.  Reduce your daily sodium intake. Most people with hypertension should eat less than 1,500 mg of sodium a day.  Limit alcohol intake to no more than 1 drink a day for nonpregnant women and 2 drinks a day for men. One drink equals 12 oz of beer, 5 oz of wine, or 1 oz of hard  liquor. Lifestyle  Work with your health care provider to maintain a healthy body weight or to lose weight. Ask what an ideal weight is for you.  Get at least 30 minutes of exercise that causes your heart to beat faster (aerobic exercise) most days of the week. Activities may include walking, swimming, or biking.  Include exercise to strengthen your muscles (resistance exercise), such as pilates or lifting weights, as part of your weekly exercise routine. Try to do these types of exercises for 30 minutes at least 3 days a week.  Do not use any products that contain nicotine or tobacco, such as cigarettes and e-cigarettes. If you need help quitting, ask your health care provider.  Monitor your blood pressure at home as told by your health care provider.  Keep all follow-up visits as told by your health care provider. This is important. Medicines  Take over-the-counter and prescription medicines only as told by your health care provider. Follow directions carefully. Blood pressure medicines must be taken as prescribed.  Do not skip doses of blood pressure medicine. Doing this puts you at risk for problems and   can make the medicine less effective.  Ask your health care provider about side effects or reactions to medicines that you should watch for. Contact a health care provider if:  You think you are having a reaction to a medicine you are taking.  You have headaches that keep coming back (recurring).  You feel dizzy.  You have swelling in your ankles.  You have trouble with your vision. Get help right away if:  You develop a severe headache or confusion.  You have unusual weakness or numbness.  You feel faint.  You have severe pain in your chest or abdomen.  You vomit repeatedly.  You have trouble breathing. Summary  Hypertension is when the force of blood pumping through your arteries is too strong. If this condition is not controlled, it may put you at risk for serious  complications.  Your personal target blood pressure may vary depending on your medical conditions, your age, and other factors. For most people, a normal blood pressure is less than 120/80.  Hypertension is treated with lifestyle changes, medicines, or a combination of both. Lifestyle changes include weight loss, eating a healthy, low-sodium diet, exercising more, and limiting alcohol. This information is not intended to replace advice given to you by your health care provider. Make sure you discuss any questions you have with your health care provider. Document Released: 11/12/2005 Document Revised: 10/10/2016 Document Reviewed: 10/10/2016 Elsevier Interactive Patient Education  2018 Elsevier Inc.  

## 2018-04-09 NOTE — Progress Notes (Signed)
Amy Charles 47 y.o.   Chief Complaint  Patient presents with  . Dizziness    x 3-4 weeks   . Hypertension  . Medication Refill    xyzal and k-dur    HISTORY OF PRESENT ILLNESS: This is a 47 y.o. female seen by me on 04/02/2018 with dizziness and possible hypertension.  Patient advised to monitor blood pressure at home before starting medication.  Symptoms have persisted.  Still dizzy.  Systolic blood pressure at home between 751 and 025; diastolics at home 85-277.  Denies difficulty breathing, chest pain, nausea or vomiting, or syncope.  HPI   Prior to Admission medications   Medication Sig Start Date End Date Taking? Authorizing Provider  alprazolam Duanne Moron) 2 MG tablet Take 2 mg by mouth at bedtime as needed for sleep.   Yes [provider]  amphetamine-dextroamphetamine (ADDERALL) 20 MG tablet Take 20 mg by mouth daily.   Yes [provider]  levocetirizine (XYZAL) 5 MG tablet Take 5 mg by mouth every evening.   Yes [provider]  potassium chloride (K-DUR) 10 MEQ tablet Take 1 tablet (10 mEq total) by mouth daily. 03/29/18  Yes Earleen Newport, MD    Allergies  Allergen Reactions  . Augmentin [Amoxicillin-Pot Clavulanate] Nausea And Vomiting  . Sumatriptan     convulsions  . Tetracyclines & Related     Patient Active Problem List   Diagnosis Date Noted  . Dizziness 04/02/2018  . Chronic nonintractable headache 04/02/2018  . Abdominal pain 01/02/2018  . Right sided abdominal pain   . Status post cholecystectomy   . Cholecystitis, acute 12/25/2017  . Cholecystitis 12/24/2017  . Chronic daily headache 05/18/2012    Past Medical History:  Diagnosis Date  . Allergy   . Anxiety   . Cancer ([place])   . Depression   . Gallstones   . GERD (gastroesophageal reflux disease)   . IBS (irritable bowel syndrome)   . Migraines     Past Surgical History:  Procedure Laterality Date  . BREAST BIOPSY    . CHOLECYSTECTOMY N/A 12/25/2017   Procedure: LAPAROSCOPIC CHOLECYSTECTOMY;  Surgeon: Olean Ree, MD;  Location: [facility] ORS;  Service: General;  Laterality: N/A;  . NOVASURE ABLATION    . TUBAL LIGATION      Social History   Socioeconomic History  . Marital status: Married    Spouse name: Not on file  . Number of children: Not on file  . Years of education: Not on file  . Highest education level: Not on file  Occupational History  . Not on file  Social Needs  . Financial resource strain: Not on file  . Food insecurity:    Worry: Not on file    Inability: Not on file  . Transportation needs:    Medical: Not on file    Non-medical: Not on file  Tobacco Use  . Smoking status: Current Every Day Smoker    Packs/day: 0.50    Years: 30.00    Pack years: 15.00    Types: Cigarettes  . Smokeless tobacco: Never Used  . Tobacco comment: off/on  Substance and Sexual Activity  . Alcohol use: No  . Drug use: No  . Sexual activity: Not on file  Lifestyle  . Physical activity:    Days per week: Not on file    Minutes per session: Not on file  . Stress: Not on file  Relationships  . Social connections:    Talks on phone: Not on  file    Gets together: Not on file    Attends religious service: Not on file    Active member of club or organization: Not on file    Attends meetings of clubs or organizations: Not on file    Relationship status: Not on file  . Intimate partner violence:    Fear of current or ex partner: Not on file    Emotionally abused: Not on file    Physically abused: Not on file    Forced sexual activity: Not on file  Other Topics Concern  . Not on file  Social History Narrative  . Not on file    Family History  Problem Relation Age of Onset  . Diabetes Mother   . Heart disease Mother   . Hyperlipidemia Mother   . Hypertension Mother   . Heart disease Father   . Hyperlipidemia Father   . Hypertension Father   . Cancer Father   . Hyperlipidemia Brother   . Hypertension Brother   .  Hyperlipidemia Brother   . Hypertension Brother      Review of Systems  Constitutional: Negative.  Negative for chills and fever.  HENT: Negative.  Negative for congestion, nosebleeds and sore throat.   Eyes: Negative.  Negative for blurred vision and double vision.  Respiratory: Negative.  Negative for cough and shortness of breath.   Cardiovascular: Negative.  Negative for chest pain, palpitations and leg swelling.  Gastrointestinal: Negative for abdominal pain, diarrhea, nausea and vomiting.  Genitourinary: Negative.  Negative for dysuria and hematuria.  Musculoskeletal: Negative.  Negative for back pain, myalgias and neck pain.  Skin: Negative.  Negative for rash.  Neurological: Positive for dizziness. Negative for sensory change, focal weakness and loss of consciousness.  Endo/Heme/Allergies: Negative.   All other systems reviewed and are negative.  Vitals:   04/09/18 1126  BP: (!) 150/94  Pulse: 88  Temp: 98.5 F (36.9 C)     Physical Exam  Constitutional: She is oriented to person, place, and time. She appears well-developed and well-nourished.  HENT:  Head: Normocephalic and atraumatic.  Nose: Nose normal.  Mouth/Throat: Oropharynx is clear and moist.  Eyes: Pupils are equal, round, and reactive to light. Conjunctivae and EOM are normal.  Neck: Normal range of motion. Neck supple.  Cardiovascular: Normal rate, regular rhythm and normal heart sounds.  Pulmonary/Chest: Effort normal and breath sounds normal.  Musculoskeletal: Normal range of motion. She exhibits no edema.  Lymphadenopathy:    She has no cervical adenopathy.  Neurological: She is alert and oriented to person, place, and time. No sensory deficit. She exhibits normal muscle tone.  Skin: Skin is warm and dry. Capillary refill takes less than 2 seconds. No rash noted.  Psychiatric: She has a normal mood and affect. Her behavior is normal.  Vitals reviewed.    ASSESSMENT & PLAN: Amy Charles was seen today  for dizziness, hypertension and medication refill.  Diagnoses and all orders for this visit:  Essential hypertension -     lisinopril (PRINIVIL,ZESTRIL) 20 MG tablet; Take 1 tablet (20 mg total) by mouth daily.  Dizziness  Other orders -     levocetirizine (XYZAL) 5 MG tablet; Take 1 tablet (5 mg total) by mouth every evening.    Patient Instructions       IF you received an x-ray today, you will receive an invoice from [facility]. Please contact [facility] at [phone] with questions or concerns regarding your invoice.   IF you received labwork  today, you will receive an invoice from [facility]. Please contact [facility] at [phone] with questions or concerns regarding your invoice.   Our billing staff will not be able to assist you with questions regarding bills from these companies.  You will be contacted with the lab results as soon as they are available. The fastest way to get your results is to activate your My Chart account. Instructions are located on the last page of this paperwork. If you have not heard from [place] regarding the results in 2 weeks, please contact this office.     Hypertension Hypertension, commonly called high blood pressure, is when the force of blood pumping through the arteries is too strong. The arteries are the blood vessels that carry blood from the heart throughout the body. Hypertension forces the heart to work harder to pump blood and may cause arteries to become narrow or stiff. Having untreated or uncontrolled hypertension can cause heart attacks, strokes, kidney disease, and other problems. A blood pressure reading consists of a higher number over a lower number. Ideally, your blood pressure should be below 120/80. The first ("top") number is called the systolic pressure. It is a measure of the pressure in your arteries as your heart beats. The second ("bottom") number is called the diastolic pressure. It is a measure of the  pressure in your arteries as the heart relaxes. What are the causes? The cause of this condition is not known. What increases the risk? Some risk factors for high blood pressure are under your control. Others are not. Factors you can change  Smoking.  Having type 2 diabetes mellitus, high cholesterol, or both.  Not getting enough exercise or physical activity.  Being overweight.  Having too much fat, sugar, calories, or salt (sodium) in your diet.  Drinking too much alcohol. Factors that are difficult or impossible to change  Having chronic kidney disease.  Having a family history of high blood pressure.  Age. Risk increases with age.  Race. You may be at higher risk if you are African-American.  Gender. Men are at higher risk than women before age 28. After age 66, women are at higher risk than men.  Having obstructive sleep apnea.  Stress. What are the signs or symptoms? Extremely high blood pressure (hypertensive crisis) may cause:  Headache.  Anxiety.  Shortness of breath.  Nosebleed.  Nausea and vomiting.  Severe chest pain.  Jerky movements you cannot control (seizures).  How is this diagnosed? This condition is diagnosed by measuring your blood pressure while you are seated, with your arm resting on a surface. The cuff of the blood pressure monitor will be placed directly against the skin of your upper arm at the level of your heart. It should be measured at least twice using the same arm. Certain conditions can cause a difference in blood pressure between your right and left arms. Certain factors can cause blood pressure readings to be lower or higher than normal (elevated) for a short period of time:  When your blood pressure is higher when you are in a health care provider's office than when you are at home, this is called white coat hypertension. Most people with this condition do not need medicines.  When your blood pressure is higher at home than  when you are in a health care provider's office, this is called masked hypertension. Most people with this condition may need medicines to control blood pressure.  If you have a high blood pressure reading during one  visit or you have normal blood pressure with other risk factors:  You may be asked to return on a different day to have your blood pressure checked again.  You may be asked to monitor your blood pressure at home for 1 week or longer.  If you are diagnosed with hypertension, you may have other blood or imaging tests to help your health care provider understand your overall risk for other conditions. How is this treated? This condition is treated by making healthy lifestyle changes, such as eating healthy foods, exercising more, and reducing your alcohol intake. Your health care provider may prescribe medicine if lifestyle changes are not enough to get your blood pressure under control, and if:  Your systolic blood pressure is above 130.  Your diastolic blood pressure is above 80.  Your personal target blood pressure may vary depending on your medical conditions, your age, and other factors. Follow these instructions at home: Eating and drinking  Eat a diet that is high in fiber and potassium, and low in sodium, added sugar, and fat. An example eating plan is called the DASH (Dietary Approaches to Stop Hypertension) diet. To eat this way: ? Eat plenty of fresh fruits and vegetables. Try to fill half of your plate at each meal with fruits and vegetables. ? Eat whole grains, such as whole wheat pasta, brown rice, or whole grain bread. Fill about one quarter of your plate with whole grains. ? Eat or drink low-fat dairy products, such as skim milk or low-fat yogurt. ? Avoid fatty cuts of meat, processed or cured meats, and poultry with skin. Fill about one quarter of your plate with lean proteins, such as fish, chicken without skin, beans, eggs, and tofu. ? Avoid premade and processed  foods. These tend to be higher in sodium, added sugar, and fat.  Reduce your daily sodium intake. Most people with hypertension should eat less than 1,500 mg of sodium a day.  Limit alcohol intake to no more than 1 drink a day for nonpregnant women and 2 drinks a day for men. One drink equals 12 oz of beer, 5 oz of wine, or 1 oz of hard liquor. Lifestyle  Work with your health care provider to maintain a healthy body weight or to lose weight. Ask what an ideal weight is for you.  Get at least 30 minutes of exercise that causes your heart to beat faster (aerobic exercise) most days of the week. Activities may include walking, swimming, or biking.  Include exercise to strengthen your muscles (resistance exercise), such as pilates or lifting weights, as part of your weekly exercise routine. Try to do these types of exercises for 30 minutes at least 3 days a week.  Do not use any products that contain nicotine or tobacco, such as cigarettes and e-cigarettes. If you need help quitting, ask your health care provider.  Monitor your blood pressure at home as told by your health care provider.  Keep all follow-up visits as told by your health care provider. This is important. Medicines  Take over-the-counter and prescription medicines only as told by your health care provider. Follow directions carefully. Blood pressure medicines must be taken as prescribed.  Do not skip doses of blood pressure medicine. Doing this puts you at risk for problems and can make the medicine less effective.  Ask your health care provider about side effects or reactions to medicines that you should watch for. Contact a health care provider if:  You think you are having  a reaction to a medicine you are taking.  You have headaches that keep coming back (recurring).  You feel dizzy.  You have swelling in your ankles.  You have trouble with your vision. Get help right away if:  You develop a severe headache or  confusion.  You have unusual weakness or numbness.  You feel faint.  You have severe pain in your chest or abdomen.  You vomit repeatedly.  You have trouble breathing. Summary  Hypertension is when the force of blood pumping through your arteries is too strong. If this condition is not controlled, it may put you at risk for serious complications.  Your personal target blood pressure may vary depending on your medical conditions, your age, and other factors. For most people, a normal blood pressure is less than 120/80.  Hypertension is treated with lifestyle changes, medicines, or a combination of both. Lifestyle changes include weight loss, eating a healthy, low-sodium diet, exercising more, and limiting alcohol. This information is not intended to replace advice given to you by your health care provider. Make sure you discuss any questions you have with your health care provider. Document Released: 11/12/2005 Document Revised: 10/10/2016 Document Reviewed: 10/10/2016 Elsevier Interactive Patient Education  2018 Elsevier Inc.      Agustina Caroli, MD [facility]

## 2018-04-10 ENCOUNTER — Other Ambulatory Visit: Payer: Self-pay | Admitting: Emergency Medicine

## 2018-04-10 ENCOUNTER — Encounter: Payer: Self-pay | Admitting: Emergency Medicine

## 2018-04-10 DIAGNOSIS — Z1231 Encounter for screening mammogram for malignant neoplasm of breast: Secondary | ICD-10-CM

## 2018-04-11 ENCOUNTER — Other Ambulatory Visit: Payer: Self-pay

## 2018-04-11 DIAGNOSIS — N63 Unspecified lump in unspecified breast: Secondary | ICD-10-CM

## 2018-04-16 ENCOUNTER — Encounter: Payer: Self-pay | Admitting: Emergency Medicine

## 2018-04-16 ENCOUNTER — Ambulatory Visit: Payer: 59 | Admitting: Family Medicine

## 2018-04-16 ENCOUNTER — Other Ambulatory Visit: Payer: Self-pay | Admitting: Emergency Medicine

## 2018-04-16 DIAGNOSIS — Z0289 Encounter for other administrative examinations: Secondary | ICD-10-CM

## 2018-04-16 MED ORDER — HYDROCHLOROTHIAZIDE 25 MG PO TABS: 25.0000 mg | ORAL_TABLET | Freq: Every day | ORAL | 3 refills | Status: DC

## 2018-04-16 NOTE — Telephone Encounter (Signed)
Her blood pressure readings remain high and she's still symptomatic. Need to start second anti-hypertensive. Will call in HCTZ 25 mg daily.

## 2018-04-23 ENCOUNTER — Other Ambulatory Visit: Payer: Self-pay | Admitting: Emergency Medicine

## 2018-04-23 DIAGNOSIS — N63 Unspecified lump in unspecified breast: Secondary | ICD-10-CM

## 2018-04-24 ENCOUNTER — Encounter: Payer: Self-pay | Admitting: Emergency Medicine

## 2018-04-25 ENCOUNTER — Encounter: Payer: Self-pay | Admitting: Emergency Medicine

## 2018-04-25 NOTE — Telephone Encounter (Signed)
OK to update work note. Thanks.

## 2018-05-01 ENCOUNTER — Other Ambulatory Visit: Payer: 59

## 2018-05-01 ENCOUNTER — Encounter: Payer: Self-pay | Admitting: Emergency Medicine

## 2018-05-02 NOTE — Telephone Encounter (Signed)
Find out what she wants to do. She can return to work on June 10th. Give her a note if she needs one.

## 2018-05-07 ENCOUNTER — Ambulatory Visit: Payer: 59 | Admitting: Emergency Medicine

## 2018-05-09 ENCOUNTER — Ambulatory Visit: Payer: 59 | Admitting: Emergency Medicine

## 2018-05-13 ENCOUNTER — Ambulatory Visit: Payer: 59 | Admitting: Emergency Medicine

## 2018-05-14 ENCOUNTER — Telehealth: Payer: Self-pay | Admitting: *Deleted

## 2018-05-14 ENCOUNTER — Ambulatory Visit (INDEPENDENT_AMBULATORY_CARE_PROVIDER_SITE_OTHER): Payer: 59 | Admitting: Emergency Medicine

## 2018-05-14 ENCOUNTER — Ambulatory Visit: Payer: 59 | Admitting: Cardiovascular Disease

## 2018-05-14 ENCOUNTER — Other Ambulatory Visit: Payer: Self-pay

## 2018-05-14 ENCOUNTER — Encounter

## 2018-05-14 ENCOUNTER — Encounter: Payer: Self-pay | Admitting: Emergency Medicine

## 2018-05-14 VITALS — BP 114/68 | HR 94 | Temp 98.3°F | Resp 16 | Ht 65.0 in | Wt 188.8 lb

## 2018-05-14 DIAGNOSIS — R42 Dizziness and giddiness: Secondary | ICD-10-CM

## 2018-05-14 DIAGNOSIS — I10 Essential (primary) hypertension: Secondary | ICD-10-CM | POA: Diagnosis not present

## 2018-05-14 DIAGNOSIS — F411 Generalized anxiety disorder: Secondary | ICD-10-CM | POA: Diagnosis not present

## 2018-05-14 DIAGNOSIS — Z8639 Personal history of other endocrine, nutritional and metabolic disease: Secondary | ICD-10-CM | POA: Diagnosis not present

## 2018-05-14 MED ORDER — POTASSIUM CHLORIDE ER 10 MEQ PO TBCR: 10.0000 meq | EXTENDED_RELEASE_TABLET | Freq: Every day | ORAL | 1 refills | Status: DC

## 2018-05-14 MED ORDER — HYDROCHLOROTHIAZIDE 25 MG PO TABS: 25.0000 mg | ORAL_TABLET | Freq: Every day | ORAL | 3 refills | Status: DC

## 2018-05-14 NOTE — Telephone Encounter (Addendum)
Faxed updated work note to patient's employer at [phone] as requested by the patient. Confirmation page received at 2:23 pm.

## 2018-05-14 NOTE — Progress Notes (Signed)
Amy Charles 47 y.o.   Chief Complaint  Patient presents with  . Hypertension    and dizziness x 1 month follow up  . Medication Refill    K-DUR    HISTORY OF PRESENT ILLNESS: This is a 47 y.o. female here for follow-up of several issues. Chronic dizziness for the past 2 to 3 months.  Seen by me on 04/02/2018.  Brain MRI and neurology consult requested.  Patient has failed trials of meclizine.  Has appointment to see neurologist next month but MRI has not been scheduled yet due to financial reasons. Also has a history of elevated blood pressure and palpitations.  Presently wearing a cardiac monitor.  She is to be monitored for 30 days.  Taking lisinopril and hydrochlorothiazide.  Highest blood pressure at home was 145/90.  Requesting prescription for K-Dur.  Last potassium done on 04/02/2018 was 3.6. Also has a history of generalized anxiety disorder diagnosed about 18 years ago.  At some point was taken Xanax but none for the past 5 to 6 months.  States she still gets frequent panic attacks. Also has a history of ADD diagnosed in 2010.  Was treated with Adderall but has been out of this medication for the past 2 to 3 months. Patient is a smoker but was started by her cardiologist on Chantix 2 days ago. HPI   Prior to Admission medications   Medication Sig Start Date End Date Taking? Authorizing Provider  alprazolam Duanne Moron) 2 MG tablet Take 2 mg by mouth at bedtime as needed for sleep.   Yes [provider]  amphetamine-dextroamphetamine (ADDERALL) 20 MG tablet Take 20 mg by mouth daily.   Yes [provider]  hydrochlorothiazide (HYDRODIURIL) 25 MG tablet Take 1 tablet (25 mg total) by mouth daily. 04/16/18  Yes Tori Dattilio, Ines Bloomer, MD  levocetirizine (XYZAL) 5 MG tablet Take 1 tablet (5 mg total) by mouth every evening. 04/09/18  Yes Gregroy Dombkowski, Ines Bloomer, MD  lisinopril (PRINIVIL,ZESTRIL) 20 MG tablet Take 1 tablet (20 mg total) by mouth daily. 04/09/18  Yes Lathaniel Legate,  Ines Bloomer, MD  potassium chloride (K-DUR) 10 MEQ tablet Take 1 tablet (10 mEq total) by mouth daily. 03/29/18  Yes Earleen Newport, MD  Varenicline Tartrate (CHANTIX STARTING MONTH PAK PO) Take by mouth.   Yes [provider]    Allergies  Allergen Reactions  . Augmentin [Amoxicillin-Pot Clavulanate] Nausea And Vomiting  . Sumatriptan     convulsions  . Tetracyclines & Related     Patient Active Problem List   Diagnosis Date Noted  . Dizziness 04/02/2018  . Chronic nonintractable headache 04/02/2018  . Abdominal pain 01/02/2018  . Right sided abdominal pain   . Status post cholecystectomy   . Cholecystitis, acute 12/25/2017  . Cholecystitis 12/24/2017  . Chronic daily headache 05/18/2012    Past Medical History:  Diagnosis Date  . Allergy   . Anxiety   . Cancer ([place])   . Depression   . Gallstones   . GERD (gastroesophageal reflux disease)   . IBS (irritable bowel syndrome)   . Migraines     Past Surgical History:  Procedure Laterality Date  . BREAST BIOPSY    . CHOLECYSTECTOMY N/A 12/25/2017   Procedure: LAPAROSCOPIC CHOLECYSTECTOMY;  Surgeon: Olean Ree, MD;  Location: [facility] ORS;  Service: General;  Laterality: N/A;  . NOVASURE ABLATION    . TUBAL LIGATION      Social History   Socioeconomic History  . Marital status: Married  Spouse name: Not on file  . Number of children: Not on file  . Years of education: Not on file  . Highest education level: Not on file  Occupational History  . Not on file  Social Needs  . Financial resource strain: Not on file  . Food insecurity:    Worry: Not on file    Inability: Not on file  . Transportation needs:    Medical: Not on file    Non-medical: Not on file  Tobacco Use  . Smoking status: Current Every Day Smoker    Packs/day: 0.50    Years: 30.00    Pack years: 15.00    Types: Cigarettes  . Smokeless tobacco: Never Used  . Tobacco comment: off/on  Substance and Sexual Activity  . Alcohol  use: No  . Drug use: No  . Sexual activity: Not on file  Lifestyle  . Physical activity:    Days per week: Not on file    Minutes per session: Not on file  . Stress: Not on file  Relationships  . Social connections:    Talks on phone: Not on file    Gets together: Not on file    Attends religious service: Not on file    Active member of club or organization: Not on file    Attends meetings of clubs or organizations: Not on file    Relationship status: Not on file  . Intimate partner violence:    Fear of current or ex partner: Not on file    Emotionally abused: Not on file    Physically abused: Not on file    Forced sexual activity: Not on file  Other Topics Concern  . Not on file  Social History Narrative  . Not on file    Family History  Problem Relation Age of Onset  . Diabetes Mother   . Heart disease Mother   . Hyperlipidemia Mother   . Hypertension Mother   . Heart disease Father   . Hyperlipidemia Father   . Hypertension Father   . Cancer Father   . Hyperlipidemia Brother   . Hypertension Brother   . Hyperlipidemia Brother   . Hypertension Brother      Review of Systems  Constitutional: Negative.  Negative for chills, fever and weight loss.  HENT: Negative.  Negative for congestion, hearing loss, nosebleeds, sinus pain, sore throat and tinnitus.   Eyes: Negative.  Negative for blurred vision and double vision.  Respiratory: Negative.  Negative for cough and shortness of breath.   Cardiovascular: Negative.  Negative for chest pain and palpitations.  Gastrointestinal: Negative.  Negative for abdominal pain, diarrhea, nausea and vomiting.  Genitourinary: Negative.   Musculoskeletal: Negative.  Negative for back pain, myalgias and neck pain.  Skin: Negative.  Negative for rash.  Neurological: Positive for dizziness. Negative for sensory change, speech change, focal weakness, seizures, weakness and headaches.  Endo/Heme/Allergies: Negative.     Psychiatric/Behavioral: The patient is nervous/anxious.   All other systems reviewed and are negative.   Vitals:   05/14/18 0957  BP: 114/68  Pulse: 94  Resp: 16  Temp: 98.3 F (36.8 C)  SpO2: 97%   BP Readings from Last 3 Encounters:  05/14/18 114/68  04/09/18 (!) 150/94  04/02/18 (!) 150/84     Physical Exam  Constitutional: She is oriented to person, place, and time. She appears well-developed and well-nourished.  HENT:  Head: Normocephalic and atraumatic.  Right Ear: External ear normal.  Left Ear: External  ear normal.  Nose: Nose normal.  Mouth/Throat: Oropharynx is clear and moist.  Eyes: Pupils are equal, round, and reactive to light. Conjunctivae and EOM are normal.  Neck: Normal range of motion. Neck supple. No JVD present. No thyromegaly present.  Cardiovascular: Normal rate, regular rhythm and normal heart sounds.  Pulmonary/Chest: Effort normal and breath sounds normal.  Musculoskeletal: Normal range of motion. She exhibits no edema or tenderness.  Lymphadenopathy:    She has no cervical adenopathy.  Neurological: She is alert and oriented to person, place, and time. No sensory deficit. She exhibits normal muscle tone.  Skin: Skin is warm and dry. Capillary refill takes less than 2 seconds. No rash noted.  Psychiatric: She has a normal mood and affect. Her behavior is normal.  Vitals reviewed.  A total of 25 minutes was spent in the room with the patient, greater than 50% of which was in counseling/coordination of care regarding differential diagnosis, chronic medical problems, treatment, medications, and need for follow-up.   ASSESSMENT & PLAN: Elsi was seen today for hypertension and medication refill.  Diagnoses and all orders for this visit:  Dizziness  Essential hypertension -     hydrochlorothiazide (HYDRODIURIL) 25 MG tablet; Take 1 tablet (25 mg total) by mouth daily. -     Discontinue: potassium chloride (K-DUR) 10 MEQ tablet; Take 1 tablet  (10 mEq total) by mouth daily. -     potassium chloride (K-DUR) 10 MEQ tablet; Take 1 tablet (10 mEq total) by mouth daily.  Generalized anxiety disorder -     Ambulatory referral to Psychiatry  History of hypokalemia -     Discontinue: potassium chloride (K-DUR) 10 MEQ tablet; Take 1 tablet (10 mEq total) by mouth daily. -     potassium chloride (K-DUR) 10 MEQ tablet; Take 1 tablet (10 mEq total) by mouth daily.    Patient Instructions       IF you received an x-ray today, you will receive an invoice from [facility]. Please contact [facility] at [phone] with questions or concerns regarding your invoice.   IF you received labwork today, you will receive an invoice from [facility]. Please contact [facility] at [phone] with questions or concerns regarding your invoice.   Our billing staff will not be able to assist you with questions regarding bills from these companies.  You will be contacted with the lab results as soon as they are available. The fastest way to get your results is to activate your My Chart account. Instructions are located on the last page of this paperwork. If you have not heard from [place] regarding the results in 2 weeks, please contact this office.     Hypertension Hypertension, commonly called high blood pressure, is when the force of blood pumping through the arteries is too strong. The arteries are the blood vessels that carry blood from the heart throughout the body. Hypertension forces the heart to work harder to pump blood and may cause arteries to become narrow or stiff. Having untreated or uncontrolled hypertension can cause heart attacks, strokes, kidney disease, and other problems. A blood pressure reading consists of a higher number over a lower number. Ideally, your blood pressure should be below 120/80. The first ("top") number is called the systolic pressure. It is a measure of the pressure in your arteries as your heart beats.  The second ("bottom") number is called the diastolic pressure. It is a measure of the pressure in your arteries as the heart relaxes. What are the  causes? The cause of this condition is not known. What increases the risk? Some risk factors for high blood pressure are under your control. Others are not. Factors you can change  Smoking.  Having type 2 diabetes mellitus, high cholesterol, or both.  Not getting enough exercise or physical activity.  Being overweight.  Having too much fat, sugar, calories, or salt (sodium) in your diet.  Drinking too much alcohol. Factors that are difficult or impossible to change  Having chronic kidney disease.  Having a family history of high blood pressure.  Age. Risk increases with age.  Race. You may be at higher risk if you are African-American.  Gender. Men are at higher risk than women before age 44. After age 35, women are at higher risk than men.  Having obstructive sleep apnea.  Stress. What are the signs or symptoms? Extremely high blood pressure (hypertensive crisis) may cause:  Headache.  Anxiety.  Shortness of breath.  Nosebleed.  Nausea and vomiting.  Severe chest pain.  Jerky movements you cannot control (seizures).  How is this diagnosed? This condition is diagnosed by measuring your blood pressure while you are seated, with your arm resting on a surface. The cuff of the blood pressure monitor will be placed directly against the skin of your upper arm at the level of your heart. It should be measured at least twice using the same arm. Certain conditions can cause a difference in blood pressure between your right and left arms. Certain factors can cause blood pressure readings to be lower or higher than normal (elevated) for a short period of time:  When your blood pressure is higher when you are in a health care provider's office than when you are at home, this is called white coat hypertension. Most people with this  condition do not need medicines.  When your blood pressure is higher at home than when you are in a health care provider's office, this is called masked hypertension. Most people with this condition may need medicines to control blood pressure.  If you have a high blood pressure reading during one visit or you have normal blood pressure with other risk factors:  You may be asked to return on a different day to have your blood pressure checked again.  You may be asked to monitor your blood pressure at home for 1 week or longer.  If you are diagnosed with hypertension, you may have other blood or imaging tests to help your health care provider understand your overall risk for other conditions. How is this treated? This condition is treated by making healthy lifestyle changes, such as eating healthy foods, exercising more, and reducing your alcohol intake. Your health care provider may prescribe medicine if lifestyle changes are not enough to get your blood pressure under control, and if:  Your systolic blood pressure is above 130.  Your diastolic blood pressure is above 80.  Your personal target blood pressure may vary depending on your medical conditions, your age, and other factors. Follow these instructions at home: Eating and drinking  Eat a diet that is high in fiber and potassium, and low in sodium, added sugar, and fat. An example eating plan is called the DASH (Dietary Approaches to Stop Hypertension) diet. To eat this way: ? Eat plenty of fresh fruits and vegetables. Try to fill half of your plate at each meal with fruits and vegetables. ? Eat whole grains, such as whole wheat pasta, brown rice, or whole grain bread. Fill about  one quarter of your plate with whole grains. ? Eat or drink low-fat dairy products, such as skim milk or low-fat yogurt. ? Avoid fatty cuts of meat, processed or cured meats, and poultry with skin. Fill about one quarter of your plate with lean proteins, such  as fish, chicken without skin, beans, eggs, and tofu. ? Avoid premade and processed foods. These tend to be higher in sodium, added sugar, and fat.  Reduce your daily sodium intake. Most people with hypertension should eat less than 1,500 mg of sodium a day.  Limit alcohol intake to no more than 1 drink a day for nonpregnant women and 2 drinks a day for men. One drink equals 12 oz of beer, 5 oz of wine, or 1 oz of hard liquor. Lifestyle  Work with your health care provider to maintain a healthy body weight or to lose weight. Ask what an ideal weight is for you.  Get at least 30 minutes of exercise that causes your heart to beat faster (aerobic exercise) most days of the week. Activities may include walking, swimming, or biking.  Include exercise to strengthen your muscles (resistance exercise), such as pilates or lifting weights, as part of your weekly exercise routine. Try to do these types of exercises for 30 minutes at least 3 days a week.  Do not use any products that contain nicotine or tobacco, such as cigarettes and e-cigarettes. If you need help quitting, ask your health care provider.  Monitor your blood pressure at home as told by your health care provider.  Keep all follow-up visits as told by your health care provider. This is important. Medicines  Take over-the-counter and prescription medicines only as told by your health care provider. Follow directions carefully. Blood pressure medicines must be taken as prescribed.  Do not skip doses of blood pressure medicine. Doing this puts you at risk for problems and can make the medicine less effective.  Ask your health care provider about side effects or reactions to medicines that you should watch for. Contact a health care provider if:  You think you are having a reaction to a medicine you are taking.  You have headaches that keep coming back (recurring).  You feel dizzy.  You have swelling in your ankles.  You have  trouble with your vision. Get help right away if:  You develop a severe headache or confusion.  You have unusual weakness or numbness.  You feel faint.  You have severe pain in your chest or abdomen.  You vomit repeatedly.  You have trouble breathing. Summary  Hypertension is when the force of blood pumping through your arteries is too strong. If this condition is not controlled, it may put you at risk for serious complications.  Your personal target blood pressure may vary depending on your medical conditions, your age, and other factors. For most people, a normal blood pressure is less than 120/80.  Hypertension is treated with lifestyle changes, medicines, or a combination of both. Lifestyle changes include weight loss, eating a healthy, low-sodium diet, exercising more, and limiting alcohol. This information is not intended to replace advice given to you by your health care provider. Make sure you discuss any questions you have with your health care provider. Document Released: 11/12/2005 Document Revised: 10/10/2016 Document Reviewed: 10/10/2016 Elsevier Interactive Patient Education  2018 [employer]. Dizziness Dizziness is a common problem. It makes you feel unsteady or light-headed. You may feel like you are about to pass out (faint). Dizziness can  lead to getting hurt if you stumble or fall. Dizziness can be caused by many things, including:  Medicines.  Not having enough water in your body (dehydration).  Illness.  Follow these instructions at home: Eating and drinking  Drink enough fluid to keep your pee (urine) clear or pale yellow. This helps to keep you from getting dehydrated. Try to drink more clear fluids, such as water.  Do not drink alcohol.  Limit how much caffeine you drink or eat, if your doctor tells you to do that.  Limit how much salt (sodium) you drink or eat, if your doctor tells you to do that. Activity  Avoid making quick movements. ? When  you stand up from sitting in a chair, steady yourself until you feel okay. ? In the morning, first sit up on the side of the bed. When you feel okay, stand slowly while you hold onto something. Do this until you know that your balance is fine.  If you need to stand in one place for a long time, move your legs often. Tighten and relax the muscles in your legs while you are standing.  Do not drive or use heavy machinery if you feel dizzy.  Avoid bending down if you feel dizzy. Place items in your home so you can reach them easily without leaning over. Lifestyle  Do not use any products that contain nicotine or tobacco, such as cigarettes and e-cigarettes. If you need help quitting, ask your doctor.  Try to lower your stress level. You can do this by using methods such as yoga or meditation. Talk with your doctor if you need help. General instructions  Watch your dizziness for any changes.  Take over-the-counter and prescription medicines only as told by your doctor. Talk with your doctor if you think that you are dizzy because of a medicine that you are taking.  Tell a friend or a family member that you are feeling dizzy. If he or she notices any changes in your behavior, have this person call your doctor.  Keep all follow-up visits as told by your doctor. This is important. Contact a doctor if:  Your dizziness does not go away.  Your dizziness or light-headedness gets worse.  You feel sick to your stomach (nauseous).  You have trouble hearing.  You have new symptoms.  You are unsteady on your feet.  You feel like the room is spinning. Get help right away if:  You throw up (vomit) or have watery poop (diarrhea), and you cannot eat or drink anything.  You have trouble: ? Talking. ? Walking. ? Swallowing. ? Using your arms, hands, or legs.  You feel generally weak.  You are not thinking clearly, or you have trouble forming sentences. A friend or family member may notice  this.  You have: ? Chest pain. ? Pain in your belly (abdomen). ? Shortness of breath. ? Sweating.  Your vision changes.  You are bleeding.  You have a very bad headache.  You have neck pain or a stiff neck.  You have a fever. These symptoms may be an emergency. Do not wait to see if the symptoms will go away. Get medical help right away. Call your local emergency services (911 in the [place] Do not drive yourself to the hospital. Summary  Dizziness makes you feel unsteady or light-headed. You may feel like you are about to pass out (faint).  Drink enough fluid to keep your pee (urine) clear or pale yellow. Do not drink  alcohol.  Avoid making quick movements if you feel dizzy.  Watch your dizziness for any changes. This information is not intended to replace advice given to you by your health care provider. Make sure you discuss any questions you have with your health care provider. Document Released: 11/01/2011 Document Revised: 11/29/2016 Document Reviewed: 11/29/2016 Elsevier Interactive Patient Education  2017 [employer]      Agustina Caroli, MD [facility]

## 2018-05-14 NOTE — Telephone Encounter (Signed)
Please change dates and fax back. Thanks.

## 2018-05-14 NOTE — Patient Instructions (Addendum)
   IF you received an x-ray today, you will receive an invoice from [facility]. Please contact [facility] at [phone] with questions or concerns regarding your invoice.   IF you received labwork today, you will receive an invoice from [facility]. Please contact [facility] at [phone] with questions or concerns regarding your invoice.   Our billing staff will not be able to assist you with questions regarding bills from these companies.  You will be contacted with the lab results as soon as they are available. The fastest way to get your results is to activate your My Chart account. Instructions are located on the last page of this paperwork. If you have not heard from us regarding the results in 2 weeks, please contact this office.     Hypertension Hypertension, commonly called high blood pressure, is when the force of blood pumping through the arteries is too strong. The arteries are the blood vessels that carry blood from the heart throughout the body. Hypertension forces the heart to work harder to pump blood and may cause arteries to become narrow or stiff. Having untreated or uncontrolled hypertension can cause heart attacks, strokes, kidney disease, and other problems. A blood pressure reading consists of a higher number over a lower number. Ideally, your blood pressure should be below 120/80. The first ("top") number is called the systolic pressure. It is a measure of the pressure in your arteries as your heart beats. The second ("bottom") number is called the diastolic pressure. It is a measure of the pressure in your arteries as the heart relaxes. What are the causes? The cause of this condition is not known. What increases the risk? Some risk factors for high blood pressure are under your control. Others are not. Factors you can change  Smoking.  Having type 2 diabetes mellitus, high cholesterol, or both.  Not getting enough exercise or physical  activity.  Being overweight.  Having too much fat, sugar, calories, or salt (sodium) in your diet.  Drinking too much alcohol. Factors that are difficult or impossible to change  Having chronic kidney disease.  Having a family history of high blood pressure.  Age. Risk increases with age.  Race. You may be at higher risk if you are African-American.  Gender. Men are at higher risk than women before age 45. After age 65, women are at higher risk than men.  Having obstructive sleep apnea.  Stress. What are the signs or symptoms? Extremely high blood pressure (hypertensive crisis) may cause:  Headache.  Anxiety.  Shortness of breath.  Nosebleed.  Nausea and vomiting.  Severe chest pain.  Jerky movements you cannot control (seizures).  How is this diagnosed? This condition is diagnosed by measuring your blood pressure while you are seated, with your arm resting on a surface. The cuff of the blood pressure monitor will be placed directly against the skin of your upper arm at the level of your heart. It should be measured at least twice using the same arm. Certain conditions can cause a difference in blood pressure between your right and left arms. Certain factors can cause blood pressure readings to be lower or higher than normal (elevated) for a short period of time:  When your blood pressure is higher when you are in a health care provider's office than when you are at home, this is called white coat hypertension. Most people with this condition do not need medicines.  When your blood pressure is higher at home than when you   are in a health care provider's office, this is called masked hypertension. Most people with this condition may need medicines to control blood pressure.  If you have a high blood pressure reading during one visit or you have normal blood pressure with other risk factors:  You may be asked to return on a different day to have your blood pressure  checked again.  You may be asked to monitor your blood pressure at home for 1 week or longer.  If you are diagnosed with hypertension, you may have other blood or imaging tests to help your health care provider understand your overall risk for other conditions. How is this treated? This condition is treated by making healthy lifestyle changes, such as eating healthy foods, exercising more, and reducing your alcohol intake. Your health care provider may prescribe medicine if lifestyle changes are not enough to get your blood pressure under control, and if:  Your systolic blood pressure is above 130.  Your diastolic blood pressure is above 80.  Your personal target blood pressure may vary depending on your medical conditions, your age, and other factors. Follow these instructions at home: Eating and drinking  Eat a diet that is high in fiber and potassium, and low in sodium, added sugar, and fat. An example eating plan is called the DASH (Dietary Approaches to Stop Hypertension) diet. To eat this way: ? Eat plenty of fresh fruits and vegetables. Try to fill half of your plate at each meal with fruits and vegetables. ? Eat whole grains, such as whole wheat pasta, brown rice, or whole grain bread. Fill about one quarter of your plate with whole grains. ? Eat or drink low-fat dairy products, such as skim milk or low-fat yogurt. ? Avoid fatty cuts of meat, processed or cured meats, and poultry with skin. Fill about one quarter of your plate with lean proteins, such as fish, chicken without skin, beans, eggs, and tofu. ? Avoid premade and processed foods. These tend to be higher in sodium, added sugar, and fat.  Reduce your daily sodium intake. Most people with hypertension should eat less than 1,500 mg of sodium a day.  Limit alcohol intake to no more than 1 drink a day for nonpregnant women and 2 drinks a day for men. One drink equals 12 oz of beer, 5 oz of wine, or 1 oz of hard  liquor. Lifestyle  Work with your health care provider to maintain a healthy body weight or to lose weight. Ask what an ideal weight is for you.  Get at least 30 minutes of exercise that causes your heart to beat faster (aerobic exercise) most days of the week. Activities may include walking, swimming, or biking.  Include exercise to strengthen your muscles (resistance exercise), such as pilates or lifting weights, as part of your weekly exercise routine. Try to do these types of exercises for 30 minutes at least 3 days a week.  Do not use any products that contain nicotine or tobacco, such as cigarettes and e-cigarettes. If you need help quitting, ask your health care provider.  Monitor your blood pressure at home as told by your health care provider.  Keep all follow-up visits as told by your health care provider. This is important. Medicines  Take over-the-counter and prescription medicines only as told by your health care provider. Follow directions carefully. Blood pressure medicines must be taken as prescribed.  Do not skip doses of blood pressure medicine. Doing this puts you at risk for problems and   can make the medicine less effective.  Ask your health care provider about side effects or reactions to medicines that you should watch for. Contact a health care provider if:  You think you are having a reaction to a medicine you are taking.  You have headaches that keep coming back (recurring).  You feel dizzy.  You have swelling in your ankles.  You have trouble with your vision. Get help right away if:  You develop a severe headache or confusion.  You have unusual weakness or numbness.  You feel faint.  You have severe pain in your chest or abdomen.  You vomit repeatedly.  You have trouble breathing. Summary  Hypertension is when the force of blood pumping through your arteries is too strong. If this condition is not controlled, it may put you at risk for serious  complications.  Your personal target blood pressure may vary depending on your medical conditions, your age, and other factors. For most people, a normal blood pressure is less than 120/80.  Hypertension is treated with lifestyle changes, medicines, or a combination of both. Lifestyle changes include weight loss, eating a healthy, low-sodium diet, exercising more, and limiting alcohol. This information is not intended to replace advice given to you by your health care provider. Make sure you discuss any questions you have with your health care provider. Document Released: 11/12/2005 Document Revised: 10/10/2016 Document Reviewed: 10/10/2016 Elsevier Interactive Patient Education  2018 [employer]. Dizziness Dizziness is a common problem. It makes you feel unsteady or light-headed. You may feel like you are about to pass out (faint). Dizziness can lead to getting hurt if you stumble or fall. Dizziness can be caused by many things, including:  Medicines.  Not having enough water in your body (dehydration).  Illness.  Follow these instructions at home: Eating and drinking  Drink enough fluid to keep your pee (urine) clear or pale yellow. This helps to keep you from getting dehydrated. Try to drink more clear fluids, such as water.  Do not drink alcohol.  Limit how much caffeine you drink or eat, if your doctor tells you to do that.  Limit how much salt (sodium) you drink or eat, if your doctor tells you to do that. Activity  Avoid making quick movements. ? When you stand up from sitting in a chair, steady yourself until you feel okay. ? In the morning, first sit up on the side of the bed. When you feel okay, stand slowly while you hold onto something. Do this until you know that your balance is fine.  If you need to stand in one place for a long time, move your legs often. Tighten and relax the muscles in your legs while you are standing.  Do not drive or use heavy machinery if you  feel dizzy.  Avoid bending down if you feel dizzy. Place items in your home so you can reach them easily without leaning over. Lifestyle  Do not use any products that contain nicotine or tobacco, such as cigarettes and e-cigarettes. If you need help quitting, ask your doctor.  Try to lower your stress level. You can do this by using methods such as yoga or meditation. Talk with your doctor if you need help. General instructions  Watch your dizziness for any changes.  Take over-the-counter and prescription medicines only as told by your doctor. Talk with your doctor if you think that you are dizzy because of a medicine that you are taking.  Tell a friend  or a family member that you are feeling dizzy. If he or she notices any changes in your behavior, have this person call your doctor.  Keep all follow-up visits as told by your doctor. This is important. Contact a doctor if:  Your dizziness does not go away.  Your dizziness or light-headedness gets worse.  You feel sick to your stomach (nauseous).  You have trouble hearing.  You have new symptoms.  You are unsteady on your feet.  You feel like the room is spinning. Get help right away if:  You throw up (vomit) or have watery poop (diarrhea), and you cannot eat or drink anything.  You have trouble: ? Talking. ? Walking. ? Swallowing. ? Using your arms, hands, or legs.  You feel generally weak.  You are not thinking clearly, or you have trouble forming sentences. A friend or family member may notice this.  You have: ? Chest pain. ? Pain in your belly (abdomen). ? Shortness of breath. ? Sweating.  Your vision changes.  You are bleeding.  You have a very bad headache.  You have neck pain or a stiff neck.  You have a fever. These symptoms may be an emergency. Do not wait to see if the symptoms will go away. Get medical help right away. Call your local emergency services (911 in the [place] Do not drive yourself to  the hospital. Summary  Dizziness makes you feel unsteady or light-headed. You may feel like you are about to pass out (faint).  Drink enough fluid to keep your pee (urine) clear or pale yellow. Do not drink alcohol.  Avoid making quick movements if you feel dizzy.  Watch your dizziness for any changes. This information is not intended to replace advice given to you by your health care provider. Make sure you discuss any questions you have with your health care provider. Document Released: 11/01/2011 Document Revised: 11/29/2016 Document Reviewed: 11/29/2016 Elsevier Interactive Patient Education  2017 [employer].

## 2018-05-17 ENCOUNTER — Ambulatory Visit
Admission: RE | Admit: 2018-05-17 | Discharge: 2018-05-17 | Disposition: A | Discharge status: Home / Self Care | Payer: 59 | Source: Ambulatory Visit | Attending: Emergency Medicine | Admitting: Emergency Medicine

## 2018-05-17 DIAGNOSIS — R51 Headache: Principal | ICD-10-CM

## 2018-05-17 DIAGNOSIS — G8929 Other chronic pain: Secondary | ICD-10-CM

## 2018-05-17 MED ORDER — GADOBENATE DIMEGLUMINE 529 MG/ML IV SOLN
17.0000 mL | Freq: Once | INTRAVENOUS | Status: AC | PRN
  Administered 2018-05-17: 17 mL via INTRAVENOUS

## 2018-05-19 ENCOUNTER — Encounter: Payer: Self-pay | Admitting: Emergency Medicine

## 2018-05-21 ENCOUNTER — Ambulatory Visit
Admission: RE | Admit: 2018-05-21 | Discharge: 2018-05-21 | Disposition: A | Discharge status: Home / Self Care | Payer: 59 | Source: Ambulatory Visit | Attending: Emergency Medicine | Admitting: Emergency Medicine

## 2018-05-21 DIAGNOSIS — N63 Unspecified lump in unspecified breast: Secondary | ICD-10-CM

## 2018-05-30 ENCOUNTER — Telehealth: Payer: Self-pay | Admitting: Emergency Medicine

## 2018-05-30 NOTE — Telephone Encounter (Signed)
Message sent to Medical records-follow up on record request.

## 2018-05-30 NOTE — Telephone Encounter (Unsigned)
Copied from [place] [phone] [phone]. Topic: Quick Communication - See Telephone Encounter >> May 30, 2018  2:51 PM Neva Seat wrote: [place]  Call Dorian Pod at [phone] (Please tell receptionist you do have permission to speak with Dorian Pod)  Office sent a 2nd request for medical records.  [facility] faxed them on Apr 03, 2018 to office.  Please check to see if they have been received from [facility] before they resend them.

## 2018-06-02 ENCOUNTER — Encounter: Payer: Self-pay | Admitting: Emergency Medicine

## 2018-06-04 ENCOUNTER — Ambulatory Visit: Payer: 59 | Admitting: Neurology

## 2018-06-10 ENCOUNTER — Encounter: Payer: Self-pay | Admitting: Emergency Medicine

## 2018-06-10 ENCOUNTER — Ambulatory Visit (INDEPENDENT_AMBULATORY_CARE_PROVIDER_SITE_OTHER): Payer: 59 | Admitting: Emergency Medicine

## 2018-06-10 ENCOUNTER — Other Ambulatory Visit: Payer: Self-pay

## 2018-06-10 ENCOUNTER — Ambulatory Visit: Payer: Self-pay | Admitting: *Deleted

## 2018-06-10 VITALS — BP 94/60 | HR 90 | Temp 97.7°F | Resp 16 | Ht 64.25 in | Wt 189.6 lb

## 2018-06-10 DIAGNOSIS — I9589 Other hypotension: Secondary | ICD-10-CM

## 2018-06-10 DIAGNOSIS — E860 Dehydration: Secondary | ICD-10-CM

## 2018-06-10 DIAGNOSIS — I959 Hypotension, unspecified: Secondary | ICD-10-CM | POA: Insufficient documentation

## 2018-06-10 DIAGNOSIS — T675XXA Heat exhaustion, unspecified, initial encounter: Secondary | ICD-10-CM | POA: Diagnosis not present

## 2018-06-10 NOTE — Patient Instructions (Addendum)
Do not take anti-hypertensive medication for 48 hours.   IF you received an x-ray today, you will receive an invoice from [facility]. Please contact [facility] at [phone] with questions or concerns regarding your invoice.   IF you received labwork today, you will receive an invoice from [facility]. Please contact [facility] at [phone] with questions or concerns regarding your invoice.   Our billing staff will not be able to assist you with questions regarding bills from these companies.  You will be contacted with the lab results as soon as they are available. The fastest way to get your results is to activate your My Chart account. Instructions are located on the last page of this paperwork. If you have not heard from [place] regarding the results in 2 weeks, please contact this office.     Dehydration, Adult Dehydration is when there is not enough fluid or water in your body. This happens when you lose more fluids than you take in. Dehydration can range from mild to very bad. It should be treated right away to keep it from getting very bad. Symptoms of mild dehydration may include:  Thirst.  Dry lips.  Slightly dry mouth.  Dry, warm skin.  Dizziness. Symptoms of moderate dehydration may include:  Very dry mouth.  Muscle cramps.  Dark pee (urine). Pee may be the color of tea.  Your body making less pee.  Your eyes making fewer tears.  Heartbeat that is uneven or faster than normal (palpitations).  Headache.  Light-headedness, especially when you stand up from sitting.  Fainting (syncope). Symptoms of very bad dehydration may include:  Changes in skin, such as: ? Cold and clammy skin. ? Blotchy (mottled) or pale skin. ? Skin that does not quickly return to normal after being lightly pinched and let go (poor skin turgor).  Changes in body fluids, such as: ? Feeling very thirsty. ? Your eyes making fewer tears. ? Not sweating when body  temperature is high, such as in hot weather. ? Your body making very little pee.  Changes in vital signs, such as: ? Weak pulse. ? Pulse that is more than 100 beats a minute when you are sitting still. ? Fast breathing. ? Low blood pressure.  Other changes, such as: ? Sunken eyes. ? Cold hands and feet. ? Confusion. ? Lack of energy (lethargy). ? Trouble waking up from sleep. ? Short-term weight loss. ? Unconsciousness. Follow these instructions at home:  If told by your doctor, drink an ORS: ? Make an ORS by using instructions on the package. ? Start by drinking small amounts, about  cup (120 mL) every 5-10 minutes. ? Slowly drink more until you have had the amount that your doctor said to have.  Drink enough clear fluid to keep your pee clear or pale yellow. If you were told to drink an ORS, finish the ORS first, then start slowly drinking clear fluids. Drink fluids such as: ? Water. Do not drink only water by itself. Doing that can make the salt (sodium) level in your body get too low (hyponatremia). ? Ice chips. ? Fruit juice that you have added water to (diluted). ? Low-calorie sports drinks.  Avoid: ? Alcohol. ? Drinks that have a lot of sugar. These include high-calorie sports drinks, fruit juice that does not have water added, and soda. ? Caffeine. ? Foods that are greasy or have a lot of fat or sugar.  Take over-the-counter and prescription medicines only as told by your doctor.  Do not take salt tablets. Doing that can make the salt level in your body get too high (hypernatremia).  Eat foods that have minerals (electrolytes). Examples include bananas, oranges, potatoes, tomatoes, and spinach.  Keep all follow-up visits as told by your doctor. This is important. Contact a doctor if:  You have belly (abdominal) pain that: ? Gets worse. ? Stays in one area (localizes).  You have a rash.  You have a stiff neck.  You get angry or annoyed more easily than  normal (irritability).  You are more sleepy than normal.  You have a harder time waking up than normal.  You feel: ? Weak. ? Dizzy. ? Very thirsty.  You have peed (urinated) only a small amount of very dark pee during 6-8 hours. Get help right away if:  You have symptoms of very bad dehydration.  You cannot drink fluids without throwing up (vomiting).  Your symptoms get worse with treatment.  You have a fever.  You have a very bad headache.  You are throwing up or having watery poop (diarrhea) and it: ? Gets worse. ? Does not go away.  You have blood or something green (bile) in your throw-up.  You have blood in your poop (stool). This may cause poop to look black and tarry.  You have not peed in 6-8 hours.  You pass out (faint).  Your heart rate when you are sitting still is more than 100 beats a minute.  You have trouble breathing. This information is not intended to replace advice given to you by your health care provider. Make sure you discuss any questions you have with your health care provider. Document Released: 09/08/2009 Document Revised: 06/01/2016 Document Reviewed: 01/06/2016 Elsevier Interactive Patient Education  2018 [place] Exhaustion Information WHAT IS HEAT EXHAUSTION? Heat exhaustion happens when your body gets overheated from hot weather or from exercise. Heat exhaustion can lead to heat stroke, a life-threatening condition that requires emergency care. Heat exhaustion is more likely to develop when:  You are exercising or being active.  You are in hot or humid weather.  You are in bright sunshine.  You are not drinking enough water.  WHO IS AT RISK FOR THIS CONDITION? This condition is more likely to develop in:  People who exercise in hot or humid weather.  People who exercise beyond their fitness level.  People who wear clothing that does not allow sweat to evaporate.  People who are dehydrated.  People who drink  a lot of alcoholic beverages or beverages that have caffeine. This can lead to dehydration.  People who are age 57 or older.  Children.  People who have a medical condition such as heart disease, poor circulation, sickle cell disease, or high blood pressure.  People who have a fever.  People who are very overweight (obese).  WHAT ARE THE SYMPTOMS OF THIS CONDITION? Symptoms of heat exhaustion include:  Heavy sweating along with feeling weak, dizzy, light-headed, and nauseous.  Rapid heartbeat.  Headache.  Urine that is darker than normal.  Muscle cramps, such as in the leg or side (flank).  Moist, cool, and clammy skin.  Fatigue.  Thirst.  Confusion.  Fainting.  WHAT SHOULD I DO IF I THINK I HAVE THIS CONDITION? If you think that you have heat exhaustion, call your health care provider. Follow his or her instructions. You should also:  Call a friend or a family member and ask him or her to stay with you.  Move  to a cooler location, such as: ? Into the shade. ? In front of a fan. ? An air-conditioned space.  Lie down and rest.  Slowly drink nonalcoholic, caffeine-free fluids.  Take off tight clothing or extra clothing.  Take a cool bath or shower, if possible. If you do not have access to a bath or shower, dab or mist cool water on your skin.  WHY IS IT IMPORTANT TO TREAT THIS CONDITION? It is important to take care of yourself and treat heat exhaustion as soon as possible. Untreated heat exhaustion can turn into heat stroke, which is a life-threatening condition that requires urgent medical treatment. HOW CAN I PREVENT THIS CONDITION? To prevent this condition:  Drink enough fluid to keep your urine clear or pale yellow. This helps your body to sweat properly.  Avoid outdoor activities on very hot or humid days.  Do not exercise or do other physical activity when you are not feeling well.  Take breaks often during physical activity.  Wear  light-colored, loose-fitting, and lightweight clothing when it is hot outside.  Wear a hat and use sunscreen when exercising outdoors.  Avoid being outside during the hottest times of the day.  Check with your health care provider before you start any new activity, especially if you take medicine or have a medical condition.  Start any new activity slowly and work up to your fitness level.  HOW CAN I HELP TO PROTECT ELDERLY RELATIVES AND NEIGHBORS FROM THIS CONDITION? People who are age 71 or older are at greater risk for heat exhaustion. Their bodies have a harder time adjusting to heat. They are also more likely to have a medical condition or be on medicines that increase their risk for heat exhaustion. They may get heat exhaustion indoors if the heat is high for several days. You can help to protect them during hot weather by:  Checking on them two or more times each day.  Making sure that they are drinking plenty of cool, nonalcoholic, and caffeine-free fluids.  Making sure that they use their air conditioner.  Taking them to a location where air conditioning is available.  Talking with their health care provider about their medical needs, medicines, and fluid requirements.  SEEK MEDICAL CARE IF:  Your symptoms last longer than 30 minutes.  SEEK IMMEDIATE MEDICAL CARE IF:  You have any symptoms of heat stroke. These include: ? Fever. ? Vomiting. ? Red skin. ? Inability to sweat, resulting in hot, dry skin. ? Excessive thirst. ? Rapid breathing. ? Headache. ? Confusion or disorientation. ? Fainting. ? Seizures. These symptoms may represent a serious problem that is an emergency. Do not wait to see if the symptoms will go away. Get medical help right away. Call your local emergency services (911 in the [place] Do not drive yourself to the hospital. This information is not intended to replace advice given to you by your health care provider. Make sure you discuss any  questions you have with your health care provider. Document Released: 08/21/2008 Document Revised: 06/01/2016 Document Reviewed: 03/04/2016 Elsevier Interactive Patient Education  [employer].

## 2018-06-10 NOTE — Progress Notes (Signed)
Amy Charles 47 y.o.   Chief Complaint  Patient presents with  . Hypotension    per patient started yesterday - 79/56 and 88/52    HISTORY OF PRESENT ILLNESS: This is a 47 y.o. female complaining of low blood pressure.  Feels like she is dehydrated.  Last Sunday, 2 days ago, spent significant time outside cleaning pickup truck and feels like she overheated.  Went inside on the rest of the day she felt lightheaded and weak.  Still took her blood pressure medications as usual.  The following day she developed diarrhea, nausea and vomiting, lightheadedness and weakness.  Did not take blood pressure medication today but took it yesterday.  Denies chest pain, difficulty breathing, fever, or any other associated symptoms.  No bleeding anywhere.  Blood pressure has been low all day yesterday and today.  Denies syncope but feels lightheaded.  HPI   Prior to Admission medications   Medication Sig Start Date End Date Taking? Authorizing Provider  hydrochlorothiazide (HYDRODIURIL) 25 MG tablet Take 1 tablet (25 mg total) by mouth daily. 05/14/18  Yes Belva Koziel, Ines Bloomer, MD  lisinopril (PRINIVIL,ZESTRIL) 20 MG tablet Take 1 tablet (20 mg total) by mouth daily. 04/09/18  Yes Tarek Cravens, Ines Bloomer, MD  alprazolam Duanne Moron) 2 MG tablet Take 2 mg by mouth at bedtime as needed for sleep.    [provider]  amphetamine-dextroamphetamine (ADDERALL) 20 MG tablet Take 20 mg by mouth daily.    [provider]  levocetirizine (XYZAL) 5 MG tablet Take 1 tablet (5 mg total) by mouth every evening. Patient not taking: Reported on 06/10/2018 04/09/18   Horald Pollen, MD  potassium chloride (K-DUR) 10 MEQ tablet Take 1 tablet (10 mEq total) by mouth daily. Patient not taking: Reported on 06/10/2018 05/14/18 08/12/18  Horald Pollen, MD  Varenicline Tartrate (CHANTIX STARTING MONTH PAK PO) Take by mouth.    [provider]    Allergies  Allergen Reactions  . Augmentin  [Amoxicillin-Pot Clavulanate] Nausea And Vomiting  . Sumatriptan     convulsions  . Tetracyclines & Related     Patient Active Problem List   Diagnosis Date Noted  . Essential hypertension 05/14/2018  . Generalized anxiety disorder 05/14/2018  . History of hypokalemia 05/14/2018  . Dizziness 04/02/2018  . Chronic nonintractable headache 04/02/2018  . Chronic daily headache 05/18/2012    Past Medical History:  Diagnosis Date  . Allergy   . Anxiety   . Cancer ([place])   . Depression   . Gallstones   . GERD (gastroesophageal reflux disease)   . IBS (irritable bowel syndrome)   . Migraines     Past Surgical History:  Procedure Laterality Date  . BREAST BIOPSY    . CHOLECYSTECTOMY N/A 12/25/2017   Procedure: LAPAROSCOPIC CHOLECYSTECTOMY;  Surgeon: Olean Ree, MD;  Location: [facility] ORS;  Service: General;  Laterality: N/A;  . NOVASURE ABLATION    . TUBAL LIGATION      Social History   Socioeconomic History  . Marital status: Married    Spouse name: Not on file  . Number of children: Not on file  . Years of education: Not on file  . Highest education level: Not on file  Occupational History  . Not on file  Social Needs  . Financial resource strain: Not on file  . Food insecurity:    Worry: Not on file    Inability: Not on file  . Transportation needs:    Medical: Not on file  Non-medical: Not on file  Tobacco Use  . Smoking status: Current Every Day Smoker    Packs/day: 0.50    Years: 30.00    Pack years: 15.00    Types: Cigarettes  . Smokeless tobacco: Never Used  . Tobacco comment: off/on  Substance and Sexual Activity  . Alcohol use: No  . Drug use: No  . Sexual activity: Not on file  Lifestyle  . Physical activity:    Days per week: Not on file    Minutes per session: Not on file  . Stress: Not on file  Relationships  . Social connections:    Talks on phone: Not on file    Gets together: Not on file    Attends religious service: Not on file     Active member of club or organization: Not on file    Attends meetings of clubs or organizations: Not on file    Relationship status: Not on file  . Intimate partner violence:    Fear of current or ex partner: Not on file    Emotionally abused: Not on file    Physically abused: Not on file    Forced sexual activity: Not on file  Other Topics Concern  . Not on file  Social History Narrative  . Not on file    Family History  Problem Relation Age of Onset  . Diabetes Mother   . Heart disease Mother   . Hyperlipidemia Mother   . Hypertension Mother   . Heart disease Father   . Hyperlipidemia Father   . Hypertension Father   . Cancer Father   . Hyperlipidemia Brother   . Hypertension Brother   . Hyperlipidemia Brother   . Hypertension Brother      Review of Systems  Constitutional: Positive for malaise/fatigue. Negative for chills and fever.  HENT: Negative.  Negative for congestion, nosebleeds and sore throat.   Eyes: Negative.  Negative for blurred vision and double vision.  Respiratory: Negative.  Negative for cough and shortness of breath.   Cardiovascular: Negative.  Negative for chest pain and palpitations.  Gastrointestinal: Positive for diarrhea, nausea and vomiting. Negative for abdominal pain, blood in stool and melena.  Genitourinary: Negative.  Negative for dysuria and hematuria.  Musculoskeletal: Negative.  Negative for myalgias and neck pain.  Skin: Negative for rash.  Neurological: Positive for dizziness and weakness. Negative for sensory change, speech change, focal weakness, seizures, loss of consciousness and headaches.  Endo/Heme/Allergies: Negative.   All other systems reviewed and are negative.  Repeat manual blood pressure in the room: 100/62 with pulse of 88.  BP Readings from Last 3 Encounters:  06/10/18 (!) 80/56  05/14/18 114/68  04/09/18 (!) 150/94   Vitals:   06/10/18 1710 06/10/18 1740  BP: (!) 80/56 94/60  Pulse: 96 90  Resp: 16     Temp: 97.7 F (36.5 C)   SpO2: 97%     Physical Exam  Constitutional: She is oriented to person, place, and time. She appears well-developed and well-nourished.  HENT:  Head: Normocephalic and atraumatic.  Nose: Nose normal.  Mouth/Throat: Oropharynx is clear and moist.  Eyes: Pupils are equal, round, and reactive to light. Conjunctivae and EOM are normal.  Neck: Normal range of motion. Neck supple. No thyromegaly present.  Cardiovascular: Normal rate, regular rhythm, normal heart sounds and intact distal pulses.  Pulmonary/Chest: Effort normal and breath sounds normal.  Abdominal: Soft. Bowel sounds are normal. She exhibits no distension. There is no tenderness.  Musculoskeletal: Normal range of motion. She exhibits no edema or tenderness.  Lymphadenopathy:    She has no cervical adenopathy.  Neurological: She is alert and oriented to person, place, and time. No sensory deficit. She exhibits normal muscle tone.  Skin: Skin is warm and dry. Capillary refill takes less than 2 seconds.  Psychiatric: She has a normal mood and affect. Her behavior is normal.  Vitals reviewed.  Arterial hypotension Multi-factorial.  Related to heat exhaustion, dehydration, and antihypertensive medication.  Advised to stop blood pressure medication for 48 hours.  Rehydrate and push fluids.  Rest indoors in a cool environment.  Advised to go to the ER if worse.  Follow-up here if no better in 48 hours.    ASSESSMENT & PLAN: Amy Charles was seen today for hypotension.  Diagnoses and all orders for this visit:  Heat exhaustion, initial encounter  Dehydration  Other specified hypotension      Patient Instructions    Do not take anti-hypertensive medication for 48 hours.   IF you received an x-ray today, you will receive an invoice from [facility]. Please contact [facility] at [phone] with questions or concerns regarding your invoice.   IF you received labwork today, you  will receive an invoice from [facility]. Please contact [facility] at [phone] with questions or concerns regarding your invoice.   Our billing staff will not be able to assist you with questions regarding bills from these companies.  You will be contacted with the lab results as soon as they are available. The fastest way to get your results is to activate your My Chart account. Instructions are located on the last page of this paperwork. If you have not heard from [place] regarding the results in 2 weeks, please contact this office.     Dehydration, Adult Dehydration is when there is not enough fluid or water in your body. This happens when you lose more fluids than you take in. Dehydration can range from mild to very bad. It should be treated right away to keep it from getting very bad. Symptoms of mild dehydration may include:  Thirst.  Dry lips.  Slightly dry mouth.  Dry, warm skin.  Dizziness. Symptoms of moderate dehydration may include:  Very dry mouth.  Muscle cramps.  Dark pee (urine). Pee may be the color of tea.  Your body making less pee.  Your eyes making fewer tears.  Heartbeat that is uneven or faster than normal (palpitations).  Headache.  Light-headedness, especially when you stand up from sitting.  Fainting (syncope). Symptoms of very bad dehydration may include:  Changes in skin, such as: ? Cold and clammy skin. ? Blotchy (mottled) or pale skin. ? Skin that does not quickly return to normal after being lightly pinched and let go (poor skin turgor).  Changes in body fluids, such as: ? Feeling very thirsty. ? Your eyes making fewer tears. ? Not sweating when body temperature is high, such as in hot weather. ? Your body making very little pee.  Changes in vital signs, such as: ? Weak pulse. ? Pulse that is more than 100 beats a minute when you are sitting still. ? Fast breathing. ? Low blood pressure.  Other changes, such as: ? Sunken  eyes. ? Cold hands and feet. ? Confusion. ? Lack of energy (lethargy). ? Trouble waking up from sleep. ? Short-term weight loss. ? Unconsciousness. Follow these instructions at home:  If told by your doctor, drink an ORS: ? Make an ORS by  using instructions on the package. ? Start by drinking small amounts, about  cup (120 mL) every 5-10 minutes. ? Slowly drink more until you have had the amount that your doctor said to have.  Drink enough clear fluid to keep your pee clear or pale yellow. If you were told to drink an ORS, finish the ORS first, then start slowly drinking clear fluids. Drink fluids such as: ? Water. Do not drink only water by itself. Doing that can make the salt (sodium) level in your body get too low (hyponatremia). ? Ice chips. ? Fruit juice that you have added water to (diluted). ? Low-calorie sports drinks.  Avoid: ? Alcohol. ? Drinks that have a lot of sugar. These include high-calorie sports drinks, fruit juice that does not have water added, and soda. ? Caffeine. ? Foods that are greasy or have a lot of fat or sugar.  Take over-the-counter and prescription medicines only as told by your doctor.  Do not take salt tablets. Doing that can make the salt level in your body get too high (hypernatremia).  Eat foods that have minerals (electrolytes). Examples include bananas, oranges, potatoes, tomatoes, and spinach.  Keep all follow-up visits as told by your doctor. This is important. Contact a doctor if:  You have belly (abdominal) pain that: ? Gets worse. ? Stays in one area (localizes).  You have a rash.  You have a stiff neck.  You get angry or annoyed more easily than normal (irritability).  You are more sleepy than normal.  You have a harder time waking up than normal.  You feel: ? Weak. ? Dizzy. ? Very thirsty.  You have peed (urinated) only a small amount of very dark pee during 6-8 hours. Get help right away if:  You have symptoms of  very bad dehydration.  You cannot drink fluids without throwing up (vomiting).  Your symptoms get worse with treatment.  You have a fever.  You have a very bad headache.  You are throwing up or having watery poop (diarrhea) and it: ? Gets worse. ? Does not go away.  You have blood or something green (bile) in your throw-up.  You have blood in your poop (stool). This may cause poop to look black and tarry.  You have not peed in 6-8 hours.  You pass out (faint).  Your heart rate when you are sitting still is more than 100 beats a minute.  You have trouble breathing. This information is not intended to replace advice given to you by your health care provider. Make sure you discuss any questions you have with your health care provider. Document Released: 09/08/2009 Document Revised: 06/01/2016 Document Reviewed: 01/06/2016 Elsevier Interactive Patient Education  2018 [place] Exhaustion Information WHAT IS HEAT EXHAUSTION? Heat exhaustion happens when your body gets overheated from hot weather or from exercise. Heat exhaustion can lead to heat stroke, a life-threatening condition that requires emergency care. Heat exhaustion is more likely to develop when:  You are exercising or being active.  You are in hot or humid weather.  You are in bright sunshine.  You are not drinking enough water.  WHO IS AT RISK FOR THIS CONDITION? This condition is more likely to develop in:  People who exercise in hot or humid weather.  People who exercise beyond their fitness level.  People who wear clothing that does not allow sweat to evaporate.  People who are dehydrated.  People who drink a lot of alcoholic beverages or  beverages that have caffeine. This can lead to dehydration.  People who are age 28 or older.  Children.  People who have a medical condition such as heart disease, poor circulation, sickle cell disease, or high blood pressure.  People who have a  fever.  People who are very overweight (obese).  WHAT ARE THE SYMPTOMS OF THIS CONDITION? Symptoms of heat exhaustion include:  Heavy sweating along with feeling weak, dizzy, light-headed, and nauseous.  Rapid heartbeat.  Headache.  Urine that is darker than normal.  Muscle cramps, such as in the leg or side (flank).  Moist, cool, and clammy skin.  Fatigue.  Thirst.  Confusion.  Fainting.  WHAT SHOULD I DO IF I THINK I HAVE THIS CONDITION? If you think that you have heat exhaustion, call your health care provider. Follow his or her instructions. You should also:  Call a friend or a family member and ask him or her to stay with you.  Move to a cooler location, such as: ? Into the shade. ? In front of a fan. ? An air-conditioned space.  Lie down and rest.  Slowly drink nonalcoholic, caffeine-free fluids.  Take off tight clothing or extra clothing.  Take a cool bath or shower, if possible. If you do not have access to a bath or shower, dab or mist cool water on your skin.  WHY IS IT IMPORTANT TO TREAT THIS CONDITION? It is important to take care of yourself and treat heat exhaustion as soon as possible. Untreated heat exhaustion can turn into heat stroke, which is a life-threatening condition that requires urgent medical treatment. HOW CAN I PREVENT THIS CONDITION? To prevent this condition:  Drink enough fluid to keep your urine clear or pale yellow. This helps your body to sweat properly.  Avoid outdoor activities on very hot or humid days.  Do not exercise or do other physical activity when you are not feeling well.  Take breaks often during physical activity.  Wear light-colored, loose-fitting, and lightweight clothing when it is hot outside.  Wear a hat and use sunscreen when exercising outdoors.  Avoid being outside during the hottest times of the day.  Check with your health care provider before you start any new activity, especially if you take  medicine or have a medical condition.  Start any new activity slowly and work up to your fitness level.  HOW CAN I HELP TO PROTECT ELDERLY RELATIVES AND NEIGHBORS FROM THIS CONDITION? People who are age 89 or older are at greater risk for heat exhaustion. Their bodies have a harder time adjusting to heat. They are also more likely to have a medical condition or be on medicines that increase their risk for heat exhaustion. They may get heat exhaustion indoors if the heat is high for several days. You can help to protect them during hot weather by:  Checking on them two or more times each day.  Making sure that they are drinking plenty of cool, nonalcoholic, and caffeine-free fluids.  Making sure that they use their air conditioner.  Taking them to a location where air conditioning is available.  Talking with their health care provider about their medical needs, medicines, and fluid requirements.  SEEK MEDICAL CARE IF:  Your symptoms last longer than 30 minutes.  SEEK IMMEDIATE MEDICAL CARE IF:  You have any symptoms of heat stroke. These include: ? Fever. ? Vomiting. ? Red skin. ? Inability to sweat, resulting in hot, dry skin. ? Excessive thirst. ? Rapid breathing. ?  Headache. ? Confusion or disorientation. ? Fainting. ? Seizures. These symptoms may represent a serious problem that is an emergency. Do not wait to see if the symptoms will go away. Get medical help right away. Call your local emergency services (911 in the [place] Do not drive yourself to the hospital. This information is not intended to replace advice given to you by your health care provider. Make sure you discuss any questions you have with your health care provider. Document Released: 08/21/2008 Document Revised: 06/01/2016 Document Reviewed: 03/04/2016 Elsevier Interactive Patient Education  2018 [employer]vier Inc[employer]       Agustina Caroli, MD [facility]

## 2018-06-10 NOTE — Telephone Encounter (Signed)
  Patient is calling with concerns of decreased BP- she had reading of 79/47- yesterday and 90/53- this morning. Patient states she was over heated on Sunday. She has also been wearing a heart monitor recently. Patient has not taken her BP medications today. Patient given appointment for evaluation of her symptoms. Reason for Disposition . [1] Systolic BP 90-110 AND [2] taking blood pressure medications AND [3] NOT dizzy, lightheaded or weak  Answer Assessment - Initial Assessment Questions 1. BLOOD PRESSURE: "What is the blood pressure?" "Did you take at least two measurements 5 minutes apart?"     101/68 P 99   106/68 P 98 2. ONSET: "When did you take your blood pressure?"     9:45 3. HOW: "How did you obtain the blood pressure?" (e.g., visiting nurse, automatic home BP monitor)     Automatic cuff 4. HISTORY: "Do you have a history of low blood pressure?" "What is your blood pressure normally?"     No- 120/70 ( patient has recently been sick and had some decreases) 5. MEDICATIONS: "Are you taking any medications for blood pressure?" If yes: "Have they been changed recently?"     Yes- several months ago 6. PULSE RATE: "Do you know what your pulse rate is?"      99  7. OTHER SYMPTOMS: "Have you been sick recently?" "Have you had a recent injury?"     Patient was out in the heat Sunday- she got over heated, patient was using heart monitor for 30 days- she finished wearing that 1 week ago 8. PREGNANCY: "Is there any chance you are pregnant?" "When was your last menstrual period?"     n/a  Protocols used: LOW BLOOD PRESSURE-A-AH

## 2018-06-10 NOTE — Assessment & Plan Note (Signed)
Multi-factorial.  Related to heat exhaustion, dehydration, and antihypertensive medication.  Advised to stop blood pressure medication for 48 hours.  Rehydrate and push fluids.  Rest indoors in a cool environment.  Advised to go to the ER if worse.  Follow-up here if no better in 48 hours.

## 2018-06-11 ENCOUNTER — Other Ambulatory Visit: Payer: 59

## 2018-06-19 ENCOUNTER — Other Ambulatory Visit: Payer: Self-pay

## 2018-06-19 ENCOUNTER — Ambulatory Visit (INDEPENDENT_AMBULATORY_CARE_PROVIDER_SITE_OTHER): Payer: 59 | Admitting: Urgent Care

## 2018-06-19 ENCOUNTER — Encounter: Payer: Self-pay | Admitting: Urgent Care

## 2018-06-19 VITALS — BP 132/83 | HR 84 | Temp 98.2°F | Resp 16 | Ht 64.25 in | Wt 194.6 lb

## 2018-06-19 DIAGNOSIS — I10 Essential (primary) hypertension: Secondary | ICD-10-CM | POA: Diagnosis not present

## 2018-06-19 DIAGNOSIS — I959 Hypotension, unspecified: Secondary | ICD-10-CM

## 2018-06-19 DIAGNOSIS — R5381 Other malaise: Secondary | ICD-10-CM

## 2018-06-19 DIAGNOSIS — T675XXD Heat exhaustion, unspecified, subsequent encounter: Secondary | ICD-10-CM

## 2018-06-19 DIAGNOSIS — R0789 Other chest pain: Secondary | ICD-10-CM

## 2018-06-19 DIAGNOSIS — R42 Dizziness and giddiness: Secondary | ICD-10-CM | POA: Diagnosis not present

## 2018-06-19 MED ORDER — VARENICLINE TARTRATE 1 MG PO TABS: 1.0000 mg | ORAL_TABLET | Freq: Two times a day (BID) | ORAL | 2 refills | Status: DC

## 2018-06-19 MED ORDER — LEVOCETIRIZINE DIHYDROCHLORIDE 5 MG PO TABS: 5.0000 mg | ORAL_TABLET | Freq: Every evening | ORAL | 3 refills | Status: AC

## 2018-06-19 NOTE — Patient Instructions (Addendum)
Hydrate well with at least 2 liters (1 gallon) of water daily.    Hypotension Hypotension, commonly called low blood pressure, is when the force of blood pumping through your arteries is too weak. Arteries are blood vessels that carry blood from the heart throughout the body. When blood pressure is too low, you may not get enough blood to your brain or to the rest of your organs. This can cause weakness, light-headedness, rapid heartbeat, and fainting. Depending on the cause and severity, hypotension may be harmless (benign) or cause serious problems (critical). What are the causes? Possible causes of hypotension include:  Blood loss.  Loss of body fluids (dehydration).  Heart problems.  Hormone (endocrine) problems.  Pregnancy.  Severe infection.  Lack of certain nutrients.  Severe allergic reactions (anaphylaxis).  Certain medicines, such as blood pressure medicine or medicines that make the body lose excess fluids (diuretics). Sometimes, hypotension can be caused by not taking medicine as directed, such as taking too much of a certain medicine.  What increases the risk? Certain factors can make you more likely to develop hypotension, including:  Age. Risk increases as you get older.  Conditions that affect the heart or the central nervous system.  Taking certain medicines, such as blood pressure medicine or diuretics.  Being pregnant.  What are the signs or symptoms? Symptoms of this condition may include:  Weakness.  Light-headedness.  Dizziness.  Blurred vision.  Fatigue.  Rapid heartbeat.  Fainting, in severe cases.  How is this diagnosed? This condition is diagnosed based on:  Your medical history.  Your symptoms.  Your blood pressure measurement. Your health care provider will check your blood pressure when you are: ? Lying down. ? Sitting. ? Standing.  A blood pressure reading is recorded as two numbers, such as "120 over 80" (or 120/80).  The first ("top") number is called the systolic pressure. It is a measure of the pressure in your arteries as your heart beats. The second ("bottom") number is called the diastolic pressure. It is a measure of the pressure in your arteries when your heart relaxes between beats. Blood pressure is measured in a unit called mm Hg. Healthy blood pressure for adults is 120/80. If your blood pressure is below 90/60, you may be diagnosed with hypotension. Other information or tests that may be used to diagnose hypotension include:  Your other vital signs, such as your heart rate and temperature.  Blood tests.  Tilt table test. For this test, you will be safely secured to a table that moves you from a lying position to an upright position. Your heart rhythm and blood pressure will be monitored during the test.  How is this treated? Treatment for this condition may include:  Changing your diet. This may involve eating more salt (sodium) or drinking more water.  Taking medicines to raise your blood pressure.  Changing the dosage of certain medicines you are taking that might be lowering your blood pressure.  Wearing compression stockings. These stockings help to prevent blood clots and reduce swelling in your legs.  In some cases, you may need to go to the hospital for:  Fluid replacement. This means you will receive fluids through an IV tube.  Blood replacement. This means you will receive donated blood through an IV tube (transfusion).  Treating an infection or heart problems, if this applies.  Monitoring. You may need to be monitored while medicines that you are taking wear off.  Follow these instructions at home: Eating and  drinking   Drink enough fluid to keep your urine clear or pale yellow.  Eat a healthy diet and follow instructions from your health care provider about eating or drinking restrictions. A healthy diet includes: ? Fresh fruits and vegetables. ? Whole grains. ? Lean  meats. ? Low-fat dairy products.  Eat extra salt only as directed. Do not add extra salt to your diet unless your health care provider told you to do that.  Eat frequent, small meals.  Avoid standing up suddenly after eating. Medicines  Take over-the-counter and prescription medicines only as told by your health care provider. ? Follow instructions from your health care provider about changing the dosage of your current medicines, if this applies. ? Do not stop or adjust any of your medicines on your own. General instructions  Wear compression stockings as told by your health care provider.  Get up slowly from lying down or sitting positions. This gives your blood pressure a chance to adjust.  Avoid hot showers and excessive heat as directed by your health care provider.  Return to your normal activities as told by your health care provider. Ask your health care provider what activities are safe for you.  Do not use any products that contain nicotine or tobacco, such as cigarettes and e-cigarettes. If you need help quitting, ask your health care provider.  Keep all follow-up visits as told by your health care provider. This is important. Contact a health care provider if:  You vomit.  You have diarrhea.  You have a fever for more than 2-3 days.  You feel more thirsty than usual.  You feel weak and tired. Get help right away if:  You have chest pain.  You have a fast or irregular heartbeat.  You develop numbness in any part of your body.  You cannot move your arms or your legs.  You have trouble speaking.  You become sweaty or feel light-headed.  You faint.  You feel short of breath.  You have trouble staying awake.  You feel confused. This information is not intended to replace advice given to you by your health care provider. Make sure you discuss any questions you have with your health care provider. Document Released: 11/12/2005 Document Revised:  06/01/2016 Document Reviewed: 05/03/2016 Elsevier Interactive Patient Education  2018 [employer].     IF you received an x-ray today, you will receive an invoice from [facility]. Please contact [facility] at [phone] with questions or concerns regarding your invoice.   IF you received labwork today, you will receive an invoice from [facility]. Please contact [facility] at [phone] with questions or concerns regarding your invoice.   Our billing staff will not be able to assist you with questions regarding bills from these companies.  You will be contacted with the lab results as soon as they are available. The fastest way to get your results is to activate your My Chart account. Instructions are located on the last page of this paperwork. If you have not heard from [place] regarding the results in 2 weeks, please contact this office.

## 2018-06-19 NOTE — Progress Notes (Signed)
   MRN: [id number] DOB: 1971/11/16  Subjective:   Amy Charles is a 47 y.o. female presenting for several day history of low blood pressure readings. Pressures have been in low 66Y systolic. Patient was recently seen on 06/10/2018, diagnosed with heat exhaustion. Has been taking it easy but stopped taking her blood pressure medications due to being dizzy, having general malaise, low blood pressure readings. She has had a difficult time returning to work. She is requesting a refill of her Xyzal, Chantix.  Also reports that she is still having chest pain, has had cardiologist consult. Has not followed up s/p her Holter monitor study. Still has to have a stress test and echo.  Amy Charles has a current medication list which includes the following prescription(s): alprazolam, amphetamine-dextroamphetamine, hydrochlorothiazide, levocetirizine, lisinopril, potassium chloride, and varenicline tartrate. Also is allergic to augmentin [amoxicillin-pot clavulanate]; sumatriptan; and tetracyclines & related.  Amy Charles  has a past medical history of Allergy, Anxiety, Cancer ([place]), Depression, Gallstones, GERD (gastroesophageal reflux disease), IBS (irritable bowel syndrome), and Migraines. Also  has a past surgical history that includes Novasure ablation; Tubal ligation; Breast biopsy; and Cholecystectomy (N/A, 12/25/2017).  Objective:   Vitals: BP 132/83   Pulse 84   Temp 98.2 F (36.8 C)   Resp 16   Ht 5' 4.25" (1.632 m)   Wt 194 lb 9.6 oz (88.3 kg)   SpO2 98%   BMI 33.14 kg/m   BP Readings from Last 3 Encounters:  06/19/18 132/83  06/10/18 94/60  05/14/18 114/68    Orthostatic VS for the past 24 hrs:  BP- Lying Pulse- Lying BP- Sitting Pulse- Sitting BP- Standing at 0 minutes Pulse- Standing at 0 minutes  06/19/18 1656 133/69 76 130/84 88 121/82 85   Wt Readings from Last 3 Encounters:  06/19/18 194 lb 9.6 oz (88.3 kg)  06/10/18 189 lb 9.6 oz (86 kg)  05/14/18 188 lb 12.8 oz (85.6 kg)    Physical Exam    Constitutional: She is oriented to person, place, and time. She appears well-developed and well-nourished.  HENT:  Mouth/Throat: Oropharynx is clear and moist.  Eyes: No scleral icterus.  Cardiovascular: Normal rate, regular rhythm, normal heart sounds and intact distal pulses. Exam reveals no gallop and no friction rub.  No murmur heard. Pulmonary/Chest: Effort normal and breath sounds normal. No stridor. No respiratory distress. She has no wheezes. She has no rales.  Musculoskeletal: She exhibits no edema.  Neurological: She is alert and oriented to person, place, and time.  Skin: Skin is warm and dry.  Psychiatric: She has a normal mood and affect.   Assessment and Plan :   Hypotension, unspecified hypotension type - Plan: Orthostatic vital signs, Basic metabolic panel  Essential hypertension - Plan: Basic metabolic panel  Dizziness - Plan: Basic metabolic panel  Malaise  Heat exhaustion, subsequent encounter  Atypical chest pain  Will stop HCTZ, K+. Restart lisinopril at 20mg . Labs pending. Refilled Chantix, Xyzal. Patient is to follow up with her cardiologist regarding her results from her Holter monitor study.   Jaynee Eagles, PA-C [facility] [phone] 06/19/2018  4:30 PM

## 2018-06-20 LAB — BASIC METABOLIC PANEL
BUN / CREAT RATIO: 17 (ref 9–23)
BUN: 15 mg/dL (ref 6–24)
CHLORIDE: 106 mmol/L (ref 96–106)
CO2: 25 mmol/L (ref 20–29)
Calcium: 8.7 mg/dL (ref 8.7–10.2)
Creatinine, Ser: 0.88 mg/dL (ref 0.57–1.00)
GFR calc Af Amer: 91 mL/min/{1.73_m2} (ref >59)
GFR calc non Af Amer: 79 mL/min/{1.73_m2} (ref >59)
GLUCOSE: 100 mg/dL — AB (ref 65–99)
Potassium: 3.9 mmol/L (ref 3.5–5.2)
SODIUM: 144 mmol/L (ref 134–144)

## 2018-07-05 ENCOUNTER — Ambulatory Visit: Payer: 59 | Admitting: Emergency Medicine

## 2018-07-05 ENCOUNTER — Encounter: Payer: Self-pay | Admitting: Emergency Medicine

## 2018-07-09 ENCOUNTER — Other Ambulatory Visit: Payer: Self-pay

## 2018-07-09 ENCOUNTER — Ambulatory Visit (INDEPENDENT_AMBULATORY_CARE_PROVIDER_SITE_OTHER): Payer: 59 | Admitting: Emergency Medicine

## 2018-07-09 ENCOUNTER — Ambulatory Visit: Payer: 59 | Admitting: Emergency Medicine

## 2018-07-09 ENCOUNTER — Encounter: Payer: Self-pay | Admitting: Emergency Medicine

## 2018-07-09 VITALS — BP 123/78 | HR 98 | Temp 98.0°F | Resp 16 | Ht 64.57 in | Wt 193.0 lb

## 2018-07-09 DIAGNOSIS — K529 Noninfective gastroenteritis and colitis, unspecified: Secondary | ICD-10-CM

## 2018-07-09 DIAGNOSIS — I10 Essential (primary) hypertension: Secondary | ICD-10-CM | POA: Diagnosis not present

## 2018-07-09 DIAGNOSIS — G43719 Chronic migraine without aura, intractable, without status migrainosus: Secondary | ICD-10-CM | POA: Diagnosis not present

## 2018-07-09 MED ORDER — LISINOPRIL 10 MG PO TABS: 10.0000 mg | ORAL_TABLET | Freq: Every day | ORAL | 3 refills | Status: DC

## 2018-07-09 MED ORDER — TRAMADOL HCL 50 MG PO TABS: 50.0000 mg | ORAL_TABLET | Freq: Three times a day (TID) | ORAL | 0 refills | Status: DC | PRN

## 2018-07-09 NOTE — Patient Instructions (Addendum)
I will contact you with your lab results within the next 2 weeks.  If you have not heard from [place] then please contact us. The fastest way to get your results is to register for My Chart.   IF you received an x-ray today, you will receive an invoice from [facility]. Please contact [facility] at [phone] with questions or concerns regarding your invoice.   IF you received labwork today, you will receive an invoice from [place]. Please contact [facility] at [phone] with questions or concerns regarding your invoice.   Our billing staff will not be able to assist you with questions regarding bills from these companies.  You will be contacted with the lab results as soon as they are available. The fastest way to get your results is to activate your My Chart account. Instructions are located on the last page of this paperwork. If you have not heard from [place] regarding the results in 2 weeks, please contact this office.      Diarrhea, Adult Diarrhea is when you have loose and water poop (stool) often. Diarrhea can make you feel weak and cause you to get dehydrated. Dehydration can make you tired and thirsty, make you have a dry mouth, and make it so you pee (urinate) less often. Diarrhea often lasts 2-3 days. However, it can last longer if it is a sign of something more serious. It is important to treat your diarrhea as told by your doctor. Follow these instructions at home: Eating and drinking  Follow these recommendations as told by your doctor:  Take an oral rehydration solution (ORS). This is a drink that is sold at pharmacies and stores.  Drink clear fluids, such as: ? Water. ? Ice chips. ? Diluted fruit juice. ? Low-calorie sports drinks.  Eat bland, easy-to-digest foods in small amounts as you are able. These foods include: ? Bananas. ? Applesauce. ? Rice. ? Low-fat (lean) meats. ? Toast. ? Crackers.  Avoid drinking fluids that have a lot of sugar  or caffeine in them.  Avoid alcohol.  Avoid spicy or fatty foods.  General instructions   Drink enough fluid to keep your pee (urine) clear or pale yellow.  Wash your hands often. If you cannot use soap and water, use hand sanitizer.  Make sure that all people in your home wash their hands well and often.  Take over-the-counter and prescription medicines only as told by your doctor.  Rest at home while you get better.  Watch your condition for any changes.  Take a warm bath to help with any burning or pain from having diarrhea.  Keep all follow-up visits as told by your doctor. This is important. Contact a doctor if:  You have a fever.  Your diarrhea gets worse.  You have new symptoms.  You cannot keep fluids down.  You feel light-headed or dizzy.  You have a headache.  You have muscle cramps. Get help right away if:  You have chest pain.  You feel very weak or you pass out (faint).  You have bloody or black poop or poop that look like tar.  You have very bad pain, cramping, or bloating in your belly (abdomen).  You have trouble breathing or you are breathing very quickly.  Your heart is beating very quickly.  Your skin feels cold and clammy.  You feel confused.  You have signs of dehydration, such as: ? Dark pee, hardly any pee, or no pee. ? Cracked lips. ?  Dry mouth. ? Sunken eyes. ? Sleepiness. ? Weakness. This information is not intended to replace advice given to you by your health care provider. Make sure you discuss any questions you have with your health care provider. Document Released: 04/30/2008 Document Revised: 06/01/2016 Document Reviewed: 07/19/2015 Elsevier Interactive Patient Education  2018 [employer].  Migraine Headache A migraine headache is a very strong throbbing pain on one side or both sides of your head. Migraines can also cause other symptoms. Talk with your doctor about what things may bring on (trigger) your migraine  headaches. Follow these instructions at home: Medicines  Take over-the-counter and prescription medicines only as told by your doctor.  Do not drive or use heavy machinery while taking prescription pain medicine.  To prevent or treat constipation while you are taking prescription pain medicine, your doctor may recommend that you: ? Drink enough fluid to keep your pee (urine) clear or pale yellow. ? Take over-the-counter or prescription medicines. ? Eat foods that are high in fiber. These include fresh fruits and vegetables, whole grains, and beans. ? Limit foods that are high in fat and processed sugars. These include fried and sweet foods. Lifestyle  Avoid alcohol.  Do not use any products that contain nicotine or tobacco, such as cigarettes and e-cigarettes. If you need help quitting, ask your doctor.  Get at least 8 hours of sleep every night.  Limit your stress. General instructions   Keep a journal to find out what may bring on your migraines. For example, write down: ? What you eat and drink. ? How much sleep you get. ? Any change in what you eat or drink. ? Any change in your medicines.  If you have a migraine: ? Avoid things that make your symptoms worse, such as bright lights. ? It may help to lie down in a dark, quiet room. ? Do not drive or use heavy machinery. ? Ask your doctor what activities are safe for you.  Keep all follow-up visits as told by your doctor. This is important. Contact a doctor if:  You get a migraine that is different or worse than your usual migraines. Get help right away if:  Your migraine gets very bad.  You have a fever.  You have a stiff neck.  You have trouble seeing.  Your muscles feel weak or like you cannot control them.  You start to lose your balance a lot.  You start to have trouble walking.  You pass out (faint). This information is not intended to replace advice given to you by your health care provider. Make sure  you discuss any questions you have with your health care provider. Document Released: 08/21/2008 Document Revised: 06/01/2016 Document Reviewed: 04/30/2016 Elsevier Interactive Patient Education  2018 [employer].

## 2018-07-09 NOTE — Progress Notes (Signed)
Amy Charles 47 y.o.   Chief Complaint  Patient presents with  . Migraine    pt states she has had migraine x 2 weeks with some N/V and Diarrhea. She states she does not know if it is from the blood pressure going up and down or the Migraine      HISTORY OF PRESENT ILLNESS: This is a 47 y.o. female complaining of several problems. 1.  Intractable migraine headache for the past 2 weeks.  Has a history of chronic migraines.  Typical bad headache, no new symptoms. 2.  Chronic diarrhea for 2 months.  No blood in the stools.  Occasional cramping.  Eating and drinking well.  Occasional nausea and vomiting. 3.  Has a history of hypertension.  Blood pressure has been up and down.  Was taking lisinopril 20 mg.  Recently took HCTZ 25 mg. Seen by me on 06/10/2018 and then again by PA [facility] on 06/19/2018. BMP done at that time looked within normal limits. Recent Results (from the past 2160 hour(s))  Basic metabolic panel     Status: Abnormal   Collection Time: 06/19/18  5:22 PM  Result Value Ref Range   Glucose 100 (H) 65 - 99 mg/dL   BUN 15 6 - 24 mg/dL   Creatinine, Ser 0.88 0.57 - 1.00 mg/dL   GFR calc non Af Amer 79 >59 mL/min/1.73   GFR calc Af Amer 91 >59 mL/min/1.73   BUN/Creatinine Ratio 17 9 - 23   Sodium 144 134 - 144 mmol/L   Potassium 3.9 3.5 - 5.2 mmol/L   Chloride 106 96 - 106 mmol/L   CO2 25 20 - 29 mmol/L   Calcium 8.7 8.7 - 10.2 mg/dL     HPI   Prior to Admission medications   Medication Sig Start Date End Date Taking? Authorizing Provider  alprazolam Duanne Moron) 2 MG tablet Take 2 mg by mouth at bedtime as needed for sleep.   Yes [provider]  amphetamine-dextroamphetamine (ADDERALL) 20 MG tablet Take 20 mg by mouth daily.   Yes [provider]  levocetirizine (XYZAL) 5 MG tablet Take 1 tablet (5 mg total) by mouth every evening. 06/19/18  Yes Jaynee Eagles, PA-C  lisinopril (PRINIVIL,ZESTRIL) 20 MG tablet Take 1 tablet (20 mg total) by mouth daily.  04/09/18  Yes Rosselyn Martha, Ines Bloomer, MD  varenicline (CHANTIX CONTINUING MONTH PAK) 1 MG tablet Take 1 tablet (1 mg total) by mouth 2 (two) times daily. 06/19/18  Yes Jaynee Eagles, PA-C    Allergies  Allergen Reactions  . Augmentin [Amoxicillin-Pot Clavulanate] Nausea And Vomiting  . Sumatriptan     convulsions  . Tetracyclines & Related     Patient Active Problem List   Diagnosis Date Noted  . Arterial hypotension 06/10/2018  . Dehydration 06/10/2018  . Heat exhaustion 06/10/2018  . Essential hypertension 05/14/2018  . Generalized anxiety disorder 05/14/2018  . History of hypokalemia 05/14/2018  . Dizziness 04/02/2018  . Chronic nonintractable headache 04/02/2018  . Chronic daily headache 05/18/2012    Past Medical History:  Diagnosis Date  . Allergy   . Anxiety   . Cancer ([place])   . Depression   . Gallstones   . GERD (gastroesophageal reflux disease)   . IBS (irritable bowel syndrome)   . Migraines     Past Surgical History:  Procedure Laterality Date  . BREAST BIOPSY    . CHOLECYSTECTOMY N/A 12/25/2017   Procedure: LAPAROSCOPIC CHOLECYSTECTOMY;  Surgeon: Olean Ree, MD;  Location: [facility] ORS;  Service: General;  Laterality: N/A;  . NOVASURE ABLATION    . TUBAL LIGATION      Social History   Socioeconomic History  . Marital status: Married    Spouse name: Not on file  . Number of children: Not on file  . Years of education: Not on file  . Highest education level: Not on file  Occupational History  . Not on file  Social Needs  . Financial resource strain: Not on file  . Food insecurity:    Worry: Not on file    Inability: Not on file  . Transportation needs:    Medical: Not on file    Non-medical: Not on file  Tobacco Use  . Smoking status: Current Every Day Smoker    Packs/day: 0.50    Years: 30.00    Pack years: 15.00    Types: Cigarettes  . Smokeless tobacco: Never Used  . Tobacco comment: off/on  Substance and Sexual Activity  . Alcohol use:  No  . Drug use: No  . Sexual activity: Not on file  Lifestyle  . Physical activity:    Days per week: Not on file    Minutes per session: Not on file  . Stress: Not on file  Relationships  . Social connections:    Talks on phone: Not on file    Gets together: Not on file    Attends religious service: Not on file    Active member of club or organization: Not on file    Attends meetings of clubs or organizations: Not on file    Relationship status: Not on file  . Intimate partner violence:    Fear of current or ex partner: Not on file    Emotionally abused: Not on file    Physically abused: Not on file    Forced sexual activity: Not on file  Other Topics Concern  . Not on file  Social History Narrative  . Not on file    Family History  Problem Relation Age of Onset  . Diabetes Mother   . Heart disease Mother   . Hyperlipidemia Mother   . Hypertension Mother   . Heart disease Father   . Hyperlipidemia Father   . Hypertension Father   . Cancer Father   . Hyperlipidemia Brother   . Hypertension Brother   . Hyperlipidemia Brother   . Hypertension Brother      Review of Systems  Constitutional: Negative.  Negative for chills and fever.  HENT: Negative.  Negative for congestion, nosebleeds and sore throat.   Eyes: Negative.  Negative for blurred vision and double vision.  Respiratory: Negative.  Negative for cough and shortness of breath.   Cardiovascular: Negative.  Negative for chest pain and palpitations.  Gastrointestinal: Positive for abdominal pain, diarrhea, nausea and vomiting. Negative for blood in stool and melena.  Genitourinary: Negative.  Negative for dysuria and hematuria.  Skin: Negative.  Negative for rash.  Neurological: Positive for headaches. Negative for dizziness, speech change, focal weakness and loss of consciousness.  Endo/Heme/Allergies: Negative.   All other systems reviewed and are negative.   Vitals:   07/09/18 1005  BP: 123/78  Pulse:  98  Resp: 16  Temp: 98 F (36.7 C)  SpO2: 98%    Physical Exam  Constitutional: She is oriented to person, place, and time. She appears well-developed and well-nourished.  HENT:  Head: Normocephalic and atraumatic.  Mouth/Throat: Oropharynx is clear and moist.  Eyes: Pupils are equal, round, and  reactive to light. Conjunctivae and EOM are normal.  Neck: Normal range of motion. Neck supple.  Cardiovascular: Normal rate, regular rhythm and normal heart sounds.  Pulmonary/Chest: Effort normal and breath sounds normal.  Abdominal: Soft. Bowel sounds are normal. She exhibits no distension. There is no tenderness.  Musculoskeletal: Normal range of motion. She exhibits no edema or tenderness.  Neurological: She is alert and oriented to person, place, and time. No sensory deficit. She exhibits normal muscle tone. Coordination normal.  Skin: Skin is warm and dry. Capillary refill takes less than 2 seconds.  Psychiatric: She has a normal mood and affect. Her behavior is normal.  Vitals reviewed.    ASSESSMENT & PLAN:  Kitara was seen today for migraine.  Diagnoses and all orders for this visit:  Intractable chronic migraine without aura and without status migrainosus -     traMADol (ULTRAM) 50 MG tablet; Take 1 tablet (50 mg total) by mouth every 8 (eight) hours as needed.  Chronic diarrhea -     Ambulatory referral to Gastroenterology  Essential hypertension -     lisinopril (PRINIVIL,ZESTRIL) 10 MG tablet; Take 1 tablet (10 mg total) by mouth daily.    Patient Instructions       I will contact you with your lab results within the next 2 weeks.  If you have not heard from [place] then please contact us. The fastest way to get your results is to register for My Chart.   IF you received an x-ray today, you will receive an invoice from [facility]. Please contact [facility] at [phone] with questions or concerns regarding your invoice.   IF you received  labwork today, you will receive an invoice from [place]. Please contact [facility] at [phone] with questions or concerns regarding your invoice.   Our billing staff will not be able to assist you with questions regarding bills from these companies.  You will be contacted with the lab results as soon as they are available. The fastest way to get your results is to activate your My Chart account. Instructions are located on the last page of this paperwork. If you have not heard from [place] regarding the results in 2 weeks, please contact this office.      Diarrhea, Adult Diarrhea is when you have loose and water poop (stool) often. Diarrhea can make you feel weak and cause you to get dehydrated. Dehydration can make you tired and thirsty, make you have a dry mouth, and make it so you pee (urinate) less often. Diarrhea often lasts 2-3 days. However, it can last longer if it is a sign of something more serious. It is important to treat your diarrhea as told by your doctor. Follow these instructions at home: Eating and drinking  Follow these recommendations as told by your doctor:  Take an oral rehydration solution (ORS). This is a drink that is sold at pharmacies and stores.  Drink clear fluids, such as: ? Water. ? Ice chips. ? Diluted fruit juice. ? Low-calorie sports drinks.  Eat bland, easy-to-digest foods in small amounts as you are able. These foods include: ? Bananas. ? Applesauce. ? Rice. ? Low-fat (lean) meats. ? Toast. ? Crackers.  Avoid drinking fluids that have a lot of sugar or caffeine in them.  Avoid alcohol.  Avoid spicy or fatty foods.  General instructions   Drink enough fluid to keep your pee (urine) clear or pale yellow.  Wash your hands often. If you cannot use soap and water,  use hand sanitizer.  Make sure that all people in your home wash their hands well and often.  Take over-the-counter and prescription medicines only as told by your doctor.  Rest  at home while you get better.  Watch your condition for any changes.  Take a warm bath to help with any burning or pain from having diarrhea.  Keep all follow-up visits as told by your doctor. This is important. Contact a doctor if:  You have a fever.  Your diarrhea gets worse.  You have new symptoms.  You cannot keep fluids down.  You feel light-headed or dizzy.  You have a headache.  You have muscle cramps. Get help right away if:  You have chest pain.  You feel very weak or you pass out (faint).  You have bloody or black poop or poop that look like tar.  You have very bad pain, cramping, or bloating in your belly (abdomen).  You have trouble breathing or you are breathing very quickly.  Your heart is beating very quickly.  Your skin feels cold and clammy.  You feel confused.  You have signs of dehydration, such as: ? Dark pee, hardly any pee, or no pee. ? Cracked lips. ? Dry mouth. ? Sunken eyes. ? Sleepiness. ? Weakness. This information is not intended to replace advice given to you by your health care provider. Make sure you discuss any questions you have with your health care provider. Document Released: 04/30/2008 Document Revised: 06/01/2016 Document Reviewed: 07/19/2015 Elsevier Interactive Patient Education  2018 [employer].  Migraine Headache A migraine headache is a very strong throbbing pain on one side or both sides of your head. Migraines can also cause other symptoms. Talk with your doctor about what things may bring on (trigger) your migraine headaches. Follow these instructions at home: Medicines  Take over-the-counter and prescription medicines only as told by your doctor.  Do not drive or use heavy machinery while taking prescription pain medicine.  To prevent or treat constipation while you are taking prescription pain medicine, your doctor may recommend that you: ? Drink enough fluid to keep your pee (urine) clear or pale  yellow. ? Take over-the-counter or prescription medicines. ? Eat foods that are high in fiber. These include fresh fruits and vegetables, whole grains, and beans. ? Limit foods that are high in fat and processed sugars. These include fried and sweet foods. Lifestyle  Avoid alcohol.  Do not use any products that contain nicotine or tobacco, such as cigarettes and e-cigarettes. If you need help quitting, ask your doctor.  Get at least 8 hours of sleep every night.  Limit your stress. General instructions   Keep a journal to find out what may bring on your migraines. For example, write down: ? What you eat and drink. ? How much sleep you get. ? Any change in what you eat or drink. ? Any change in your medicines.  If you have a migraine: ? Avoid things that make your symptoms worse, such as bright lights. ? It may help to lie down in a dark, quiet room. ? Do not drive or use heavy machinery. ? Ask your doctor what activities are safe for you.  Keep all follow-up visits as told by your doctor. This is important. Contact a doctor if:  You get a migraine that is different or worse than your usual migraines. Get help right away if:  Your migraine gets very bad.  You have a fever.  You have a  stiff neck.  You have trouble seeing.  Your muscles feel weak or like you cannot control them.  You start to lose your balance a lot.  You start to have trouble walking.  You pass out (faint). This information is not intended to replace advice given to you by your health care provider. Make sure you discuss any questions you have with your health care provider. Document Released: 08/21/2008 Document Revised: 06/01/2016 Document Reviewed: 04/30/2016 Elsevier Interactive Patient Education  2018 Elsevier Inc.     Agustina Caroli, MD [facility]

## 2018-07-16 ENCOUNTER — Ambulatory Visit: Payer: 59 | Admitting: Emergency Medicine

## 2018-07-18 ENCOUNTER — Ambulatory Visit (INDEPENDENT_AMBULATORY_CARE_PROVIDER_SITE_OTHER): Payer: 59 | Admitting: Emergency Medicine

## 2018-07-18 ENCOUNTER — Other Ambulatory Visit: Payer: Self-pay

## 2018-07-18 ENCOUNTER — Encounter: Payer: Self-pay | Admitting: Emergency Medicine

## 2018-07-18 VITALS — BP 116/76 | HR 91 | Temp 97.7°F | Resp 16 | Ht 64.57 in | Wt 188.0 lb

## 2018-07-18 DIAGNOSIS — F418 Other specified anxiety disorders: Secondary | ICD-10-CM | POA: Diagnosis not present

## 2018-07-18 DIAGNOSIS — K529 Noninfective gastroenteritis and colitis, unspecified: Secondary | ICD-10-CM

## 2018-07-18 DIAGNOSIS — I10 Essential (primary) hypertension: Secondary | ICD-10-CM

## 2018-07-18 DIAGNOSIS — F411 Generalized anxiety disorder: Secondary | ICD-10-CM

## 2018-07-18 MED ORDER — ALPRAZOLAM 0.5 MG PO TABS: 0.5000 mg | ORAL_TABLET | Freq: Two times a day (BID) | ORAL | 1 refills | Status: DC | PRN

## 2018-07-18 MED ORDER — ESCITALOPRAM OXALATE 10 MG PO TABS: 10.0000 mg | ORAL_TABLET | Freq: Every day | ORAL | 1 refills | Status: DC

## 2018-07-18 NOTE — Assessment & Plan Note (Signed)
Presently untreated and uncontrolled.  Will start Lexapro 10 mg a day use Xanax 0.5 mg as needed while Lexapro kicks in.  Most likely all symptoms are related to GAD.  Will reevaluate in 3 months.  Sooner if needed.

## 2018-07-18 NOTE — Assessment & Plan Note (Signed)
Well-controlled today on 20 mg of lisinopril.  Advised to continue taking this dose but also monitor blood pressure at home on a daily basis.

## 2018-07-18 NOTE — Patient Instructions (Addendum)
If you have lab work done today you will be contacted with your lab results within the next 2 weeks.  If you have not heard from [place] then please contact us. The fastest way to get your results is to register for My Chart.   IF you received an x-ray today, you will receive an invoice from [facility]. Please contact [facility] at [phone] with questions or concerns regarding your invoice.   IF you received labwork today, you will receive an invoice from [place] [facility]. Please contact [facility] at [phone] with questions or concerns regarding your invoice.   Our billing staff will not be able to assist you with questions regarding bills from these companies.  You will be contacted with the lab results as soon as they are available. The fastest way to get your results is to activate your My Chart account. Instructions are located on the last page of this paperwork. If you have not heard from [place] regarding the results in 2 weeks, please contact this office.      Generalized Anxiety Disorder, Adult Generalized anxiety disorder (GAD) is a mental health disorder. People with this condition constantly worry about everyday events. Unlike normal anxiety, worry related to GAD is not triggered by a specific event. These worries also do not fade or get better with time. GAD interferes with life functions, including relationships, work, and school. GAD can vary from mild to severe. People with severe GAD can have intense waves of anxiety with physical symptoms (panic attacks). What are the causes? The exact cause of GAD is not known. What increases the risk? This condition is more likely to develop in:  Women.  People who have a family history of anxiety disorders.  People who are very shy.  People who experience very stressful life events, such as the death of a loved one.  People who have a very stressful family environment.  What are the signs or symptoms? People with  GAD often worry excessively about many things in their lives, such as their health and family. They may also be overly concerned about:  Doing well at work.  Being on time.  Natural disasters.  Friendships.  Physical symptoms of GAD include:  Fatigue.  Muscle tension or having muscle twitches.  Trembling or feeling shaky.  Being easily startled.  Feeling like your heart is pounding or racing.  Feeling out of breath or like you cannot take a deep breath.  Having trouble falling asleep or staying asleep.  Sweating.  Nausea, diarrhea, or irritable bowel syndrome (IBS).  Headaches.  Trouble concentrating or remembering facts.  Restlessness.  Irritability.  How is this diagnosed? Your health care provider can diagnose GAD based on your symptoms and medical history. You will also have a physical exam. The health care provider will ask specific questions about your symptoms, including how severe they are, when they started, and if they come and go. Your health care provider may ask you about your use of alcohol or drugs, including prescription medicines. Your health care provider may refer you to a mental health specialist for further evaluation. Your health care provider will do a thorough examination and may perform additional tests to rule out other possible causes of your symptoms. To be diagnosed with GAD, a person must have anxiety that:  Is out of his or her control.  Affects several different aspects of his or her life, such as work and relationships.  Causes distress that makes him or her  unable to take part in normal activities.  Includes at least three physical symptoms of GAD, such as restlessness, fatigue, trouble concentrating, irritability, muscle tension, or sleep problems.  Before your health care provider can confirm a diagnosis of GAD, these symptoms must be present more days than they are not, and they must last for six months or longer. How is this  treated? The following therapies are usually used to treat GAD:  Medicine. Antidepressant medicine is usually prescribed for long-term daily control. Antianxiety medicines may be added in severe cases, especially when panic attacks occur.  Talk therapy (psychotherapy). Certain types of talk therapy can be helpful in treating GAD by providing support, education, and guidance. Options include: ? Cognitive behavioral therapy (CBT). People learn coping skills and techniques to ease their anxiety. They learn to identify unrealistic or negative thoughts and behaviors and to replace them with positive ones. ? Acceptance and commitment therapy (ACT). This treatment teaches people how to be mindful as a way to cope with unwanted thoughts and feelings. ? Biofeedback. This process trains you to manage your body's response (physiological response) through breathing techniques and relaxation methods. You will work with a therapist while machines are used to monitor your physical symptoms.  Stress management techniques. These include yoga, meditation, and exercise.  A mental health specialist can help determine which treatment is best for you. Some people see improvement with one type of therapy. However, other people require a combination of therapies. Follow these instructions at home:  Take over-the-counter and prescription medicines only as told by your health care provider.  Try to maintain a normal routine.  Try to anticipate stressful situations and allow extra time to manage them.  Practice any stress management or self-calming techniques as taught by your health care provider.  Do not punish yourself for setbacks or for not making progress.  Try to recognize your accomplishments, even if they are small.  Keep all follow-up visits as told by your health care provider. This is important. Contact a health care provider if:  Your symptoms do not get better.  Your symptoms get worse.  You  have signs of depression, such as: ? A persistently sad, cranky, or irritable mood. ? Loss of enjoyment in activities that used to bring you joy. ? Change in weight or eating. ? Changes in sleeping habits. ? Avoiding friends or family members. ? Loss of energy for normal tasks. ? Feelings of guilt or worthlessness. Get help right away if:  You have serious thoughts about hurting yourself or others. If you ever feel like you may hurt yourself or others, or have thoughts about taking your own life, get help right away. You can go to your nearest emergency department or call:  Your local emergency services (911 in the [place]  A suicide crisis helpline, such as the [place] at (425) 860-9412. This is open 24 hours a day.  Summary  Generalized anxiety disorder (GAD) is a mental health disorder that involves worry that is not triggered by a specific event.  People with GAD often worry excessively about many things in their lives, such as their health and family.  GAD may cause physical symptoms such as restlessness, trouble concentrating, sleep problems, frequent sweating, nausea, diarrhea, headaches, and trembling or muscle twitching.  A mental health specialist can help determine which treatment is best for you. Some people see improvement with one type of therapy. However, other people require a combination of therapies. This information is not intended  to replace advice given to you by your health care provider. Make sure you discuss any questions you have with your health care provider. Document Released: 03/09/2013 Document Revised: 10/02/2016 Document Reviewed: 10/02/2016 Elsevier Interactive Patient Education  [employer].

## 2018-07-18 NOTE — Progress Notes (Signed)
Amy Charles 47 y.o.   Chief Complaint  Patient presents with  . Diarrhea    going on for a while now has had 2 regular bm within last month   . Hypertension    BP was 190/92 so she took 20mg  of lisinopril   . Nausea    hasn't went away, hasn't vomitted    Depression screen [facility] 2/9 07/18/2018 07/09/2018 07/09/2018 06/19/2018 06/10/2018  Decreased Interest 0 0 0 0 0  Down, Depressed, Hopeless 0 0 0 0 0  PHQ - 2 Score 0 0 0 0 0    HISTORY OF PRESENT ILLNESS: This is a 47 y.o. female with the following complaints today: 1.  Variable blood pressure, at times too high and at times too low.  This morning it was 190/90 so she took 20 mg of lisinopril instead of her usual 10 mg. #2 chronic diarrhea with occasional nausea.  Has been diagnosed with IBS in the past.  Has taken Imodium sometimes with good results. 3.  Has a history of generalized anxiety disorder.  Presently untreated.  Has used Lexapro and as needed Xanax in the past with good results.  Stress level today 10. Thinks all her symptoms may be related to anxiety and I tend to agree. No new symptomatology. Past work-up reviewed along with recent labs.  HPI   Prior to Admission medications   Medication Sig Start Date End Date Taking? Authorizing Provider  levocetirizine (XYZAL) 5 MG tablet Take 1 tablet (5 mg total) by mouth every evening. 06/19/18  Yes Jaynee Eagles, PA-C  lisinopril (PRINIVIL,ZESTRIL) 10 MG tablet Take 1 tablet (10 mg total) by mouth daily. 07/09/18  Yes Redina Zeller, Ines Bloomer, MD  traMADol (ULTRAM) 50 MG tablet Take 1 tablet (50 mg total) by mouth every 8 (eight) hours as needed. 07/09/18  Yes Imad Shostak, Ines Bloomer, MD  varenicline (CHANTIX CONTINUING MONTH PAK) 1 MG tablet Take 1 tablet (1 mg total) by mouth 2 (two) times daily. 06/19/18  Yes Jaynee Eagles, PA-C    Allergies  Allergen Reactions  . Augmentin [Amoxicillin-Pot Clavulanate] Nausea And Vomiting  . Sumatriptan     convulsions  . Tetracyclines & Related       Patient Active Problem List   Diagnosis Date Noted  . Arterial hypotension 06/10/2018  . Essential hypertension 05/14/2018  . Generalized anxiety disorder 05/14/2018  . Chronic daily headache 05/18/2012    Past Medical History:  Diagnosis Date  . Allergy   . Anxiety   . Cancer ([place])   . Depression   . Gallstones   . GERD (gastroesophageal reflux disease)   . IBS (irritable bowel syndrome)   . Migraines     Past Surgical History:  Procedure Laterality Date  . BREAST BIOPSY    . CHOLECYSTECTOMY N/A 12/25/2017   Procedure: LAPAROSCOPIC CHOLECYSTECTOMY;  Surgeon: Olean Ree, MD;  Location: [facility] ORS;  Service: General;  Laterality: N/A;  . NOVASURE ABLATION    . TUBAL LIGATION      Social History   Socioeconomic History  . Marital status: Married    Spouse name: Not on file  . Number of children: Not on file  . Years of education: Not on file  . Highest education level: Not on file  Occupational History  . Not on file  Social Needs  . Financial resource strain: Not on file  . Food insecurity:    Worry: Not on file    Inability: Not on file  . Transportation needs:  Medical: Not on file    Non-medical: Not on file  Tobacco Use  . Smoking status: Current Every Day Smoker    Packs/day: 0.50    Years: 30.00    Pack years: 15.00    Types: Cigarettes  . Smokeless tobacco: Never Used  . Tobacco comment: off/on  Substance and Sexual Activity  . Alcohol use: No  . Drug use: No  . Sexual activity: Not on file  Lifestyle  . Physical activity:    Days per week: Not on file    Minutes per session: Not on file  . Stress: Not on file  Relationships  . Social connections:    Talks on phone: Not on file    Gets together: Not on file    Attends religious service: Not on file    Active member of club or organization: Not on file    Attends meetings of clubs or organizations: Not on file    Relationship status: Not on file  . Intimate partner violence:     Fear of current or ex partner: Not on file    Emotionally abused: Not on file    Physically abused: Not on file    Forced sexual activity: Not on file  Other Topics Concern  . Not on file  Social History Narrative  . Not on file    Family History  Problem Relation Age of Onset  . Diabetes Mother   . Heart disease Mother   . Hyperlipidemia Mother   . Hypertension Mother   . Heart disease Father   . Hyperlipidemia Father   . Hypertension Father   . Cancer Father   . Hyperlipidemia Brother   . Hypertension Brother   . Hyperlipidemia Brother   . Hypertension Brother      Review of Systems  Constitutional: Negative.  Negative for chills, fever and malaise/fatigue.  HENT: Negative.  Negative for congestion, hearing loss, nosebleeds, sinus pain and sore throat.   Eyes: Negative.  Negative for blurred vision and double vision.  Respiratory: Negative.  Negative for cough, hemoptysis and shortness of breath.   Cardiovascular: Negative.  Negative for chest pain, palpitations, claudication and leg swelling.  Gastrointestinal: Positive for diarrhea and nausea.  Genitourinary: Negative.   Musculoskeletal: Negative for back pain, myalgias and neck pain.  Skin: Negative.  Negative for rash.  Neurological: Positive for headaches.  Psychiatric/Behavioral: The patient is nervous/anxious.   All other systems reviewed and are negative.   Vitals:   07/18/18 1352  BP: 116/76  Pulse: 91  Resp: 16  Temp: 97.7 F (36.5 C)  SpO2: 98%    Physical Exam  Constitutional: She is oriented to person, place, and time. She appears well-developed and well-nourished.  HENT:  Head: Normocephalic and atraumatic.  Nose: Nose normal.  Mouth/Throat: Oropharynx is clear and moist.  Eyes: Pupils are equal, round, and reactive to light. Conjunctivae and EOM are normal.  Neck: Normal range of motion. Neck supple. No thyromegaly present.  Cardiovascular: Normal rate, regular rhythm and normal heart  sounds.  Pulmonary/Chest: Effort normal and breath sounds normal.  Abdominal: Soft. There is no tenderness.  Musculoskeletal: Normal range of motion. She exhibits no edema.  Lymphadenopathy:    She has no cervical adenopathy.  Neurological: She is alert and oriented to person, place, and time. No sensory deficit. She exhibits normal muscle tone. Coordination normal.  Skin: Skin is warm and dry.  Psychiatric: She has a normal mood and affect. Her behavior is normal.  Patient started tearing during our conversation.  Obviously very anxious and stressed out.  Vitals reviewed.  Generalized anxiety disorder Presently untreated and uncontrolled.  Will start Lexapro 10 mg a day use Xanax 0.5 mg as needed while Lexapro kicks in.  Most likely all symptoms are related to GAD.  Will reevaluate in 3 months.  Sooner if needed.  Essential hypertension Well-controlled today on 20 mg of lisinopril.  Advised to continue taking this dose but also monitor blood pressure at home on a daily basis.     ASSESSMENT & PLAN: Fallon was seen today for diarrhea, hypertension and nausea.  Diagnoses and all orders for this visit:  Generalized anxiety disorder -     escitalopram (LEXAPRO) 10 MG tablet; Take 1 tablet (10 mg total) by mouth daily.  Essential hypertension  Chronic diarrhea  Situational anxiety -     ALPRAZolam (XANAX) 0.5 MG tablet; Take 1 tablet (0.5 mg total) by mouth 2 (two) times daily as needed for anxiety.    Patient Instructions       If you have lab work done today you will be contacted with your lab results within the next 2 weeks.  If you have not heard from [place] then please contact us. The fastest way to get your results is to register for My Chart.   IF you received an x-ray today, you will receive an invoice from [facility]. Please contact [facility] at [phone] with questions or concerns regarding your invoice.   IF you received labwork today, you  will receive an invoice from [place] [facility]. Please contact [facility] at [phone] with questions or concerns regarding your invoice.   Our billing staff will not be able to assist you with questions regarding bills from these companies.  You will be contacted with the lab results as soon as they are available. The fastest way to get your results is to activate your My Chart account. Instructions are located on the last page of this paperwork. If you have not heard from [place] regarding the results in 2 weeks, please contact this office.      Generalized Anxiety Disorder, Adult Generalized anxiety disorder (GAD) is a mental health disorder. People with this condition constantly worry about everyday events. Unlike normal anxiety, worry related to GAD is not triggered by a specific event. These worries also do not fade or get better with time. GAD interferes with life functions, including relationships, work, and school. GAD can vary from mild to severe. People with severe GAD can have intense waves of anxiety with physical symptoms (panic attacks). What are the causes? The exact cause of GAD is not known. What increases the risk? This condition is more likely to develop in:  Women.  People who have a family history of anxiety disorders.  People who are very shy.  People who experience very stressful life events, such as the death of a loved one.  People who have a very stressful family environment.  What are the signs or symptoms? People with GAD often worry excessively about many things in their lives, such as their health and family. They may also be overly concerned about:  Doing well at work.  Being on time.  Natural disasters.  Friendships.  Physical symptoms of GAD include:  Fatigue.  Muscle tension or having muscle twitches.  Trembling or feeling shaky.  Being easily startled.  Feeling like your heart is pounding or racing.  Feeling out of breath or like you cannot  take a  deep breath.  Having trouble falling asleep or staying asleep.  Sweating.  Nausea, diarrhea, or irritable bowel syndrome (IBS).  Headaches.  Trouble concentrating or remembering facts.  Restlessness.  Irritability.  How is this diagnosed? Your health care provider can diagnose GAD based on your symptoms and medical history. You will also have a physical exam. The health care provider will ask specific questions about your symptoms, including how severe they are, when they started, and if they come and go. Your health care provider may ask you about your use of alcohol or drugs, including prescription medicines. Your health care provider may refer you to a mental health specialist for further evaluation. Your health care provider will do a thorough examination and may perform additional tests to rule out other possible causes of your symptoms. To be diagnosed with GAD, a person must have anxiety that:  Is out of his or her control.  Affects several different aspects of his or her life, such as work and relationships.  Causes distress that makes him or her unable to take part in normal activities.  Includes at least three physical symptoms of GAD, such as restlessness, fatigue, trouble concentrating, irritability, muscle tension, or sleep problems.  Before your health care provider can confirm a diagnosis of GAD, these symptoms must be present more days than they are not, and they must last for six months or longer. How is this treated? The following therapies are usually used to treat GAD:  Medicine. Antidepressant medicine is usually prescribed for long-term daily control. Antianxiety medicines may be added in severe cases, especially when panic attacks occur.  Talk therapy (psychotherapy). Certain types of talk therapy can be helpful in treating GAD by providing support, education, and guidance. Options include: ? Cognitive behavioral therapy (CBT). People learn coping  skills and techniques to ease their anxiety. They learn to identify unrealistic or negative thoughts and behaviors and to replace them with positive ones. ? Acceptance and commitment therapy (ACT). This treatment teaches people how to be mindful as a way to cope with unwanted thoughts and feelings. ? Biofeedback. This process trains you to manage your body's response (physiological response) through breathing techniques and relaxation methods. You will work with a therapist while machines are used to monitor your physical symptoms.  Stress management techniques. These include yoga, meditation, and exercise.  A mental health specialist can help determine which treatment is best for you. Some people see improvement with one type of therapy. However, other people require a combination of therapies. Follow these instructions at home:  Take over-the-counter and prescription medicines only as told by your health care provider.  Try to maintain a normal routine.  Try to anticipate stressful situations and allow extra time to manage them.  Practice any stress management or self-calming techniques as taught by your health care provider.  Do not punish yourself for setbacks or for not making progress.  Try to recognize your accomplishments, even if they are small.  Keep all follow-up visits as told by your health care provider. This is important. Contact a health care provider if:  Your symptoms do not get better.  Your symptoms get worse.  You have signs of depression, such as: ? A persistently sad, cranky, or irritable mood. ? Loss of enjoyment in activities that used to bring you joy. ? Change in weight or eating. ? Changes in sleeping habits. ? Avoiding friends or family members. ? Loss of energy for normal tasks. ? Feelings of guilt or worthlessness.  Get help right away if:  You have serious thoughts about hurting yourself or others. If you ever feel like you may hurt yourself or  others, or have thoughts about taking your own life, get help right away. You can go to your nearest emergency department or call:  Your local emergency services (911 in the [place]  A suicide crisis helpline, such as the [place] at (820)314-4429. This is open 24 hours a day.  Summary  Generalized anxiety disorder (GAD) is a mental health disorder that involves worry that is not triggered by a specific event.  People with GAD often worry excessively about many things in their lives, such as their health and family.  GAD may cause physical symptoms such as restlessness, trouble concentrating, sleep problems, frequent sweating, nausea, diarrhea, headaches, and trembling or muscle twitching.  A mental health specialist can help determine which treatment is best for you. Some people see improvement with one type of therapy. However, other people require a combination of therapies. This information is not intended to replace advice given to you by your health care provider. Make sure you discuss any questions you have with your health care provider. Document Released: 03/09/2013 Document Revised: 10/02/2016 Document Reviewed: 10/02/2016 Elsevier Interactive Patient Education  2018 [employer].      Agustina Caroli, MD [facility]

## 2018-07-30 ENCOUNTER — Encounter: Payer: 59 | Admitting: Obstetrics & Gynecology

## 2018-07-30 DIAGNOSIS — Z0289 Encounter for other administrative examinations: Secondary | ICD-10-CM

## 2018-08-30 ENCOUNTER — Other Ambulatory Visit: Payer: Self-pay | Admitting: Emergency Medicine

## 2018-08-30 DIAGNOSIS — F418 Other specified anxiety disorders: Secondary | ICD-10-CM

## 2018-09-01 NOTE — Telephone Encounter (Signed)
Requested medication (s) are due for refill today: Yes  Requested medication (s) are on the active medication list: Yes  Last refill:  07/18/18  Future visit scheduled: Yes  Notes to clinic:  Unable to refill narcotic per protocol.     Requested Prescriptions  Pending Prescriptions Disp Refills   ALPRAZolam (XANAX) 0.5 MG tablet [Pharmacy Med Name: ALPRAZOLAM 0.5 MG TABLET] 30 tablet 1    Sig: Take 1 tablet (0.5 mg total) by mouth 2 (two) times daily as needed for anxiety.     Not Delegated - Psychiatry:  Anxiolytics/Hypnotics Failed - 08/30/2018  1:59 PM      Failed - This refill cannot be delegated      Failed - Urine Drug Screen completed in last 360 days.      Passed - Valid encounter within last 6 months    Recent Outpatient Visits          1 month ago Generalized anxiety disorder   [facility] at [place], [place], MD   1 month ago Intractable chronic migraine without aura and without status migrainosus   Primary Care at [facility], [place], MD   2 months ago Hypotension, unspecified hypotension type   Primary Care at [place], [place]   2 months ago Heat exhaustion, initial encounter   Primary Care at [facility], [place], MD   3 months ago Dizziness   Primary Care at [facility], Ines Bloomer, MD      Future Appointments            In 1 month Sagardia, Ines Bloomer, MD Primary Care at [place], [facility]

## 2018-09-01 NOTE — Telephone Encounter (Signed)
Requesting refill on medication. Eval for this medication will be in Nov per your note from 07/18/2018

## 2018-09-26 ENCOUNTER — Other Ambulatory Visit: Payer: Self-pay | Admitting: Urgent Care

## 2018-10-15 ENCOUNTER — Ambulatory Visit: Payer: 59 | Admitting: Emergency Medicine

## 2018-10-19 ENCOUNTER — Other Ambulatory Visit: Payer: Self-pay | Admitting: Emergency Medicine

## 2018-10-19 DIAGNOSIS — F418 Other specified anxiety disorders: Secondary | ICD-10-CM

## 2018-10-20 NOTE — Telephone Encounter (Signed)
Requested medication (s) are due for refill today: yes  Requested medication (s) are on the active medication list: yes    Last refill: 09/02/18 #30 1 refill  Future visit scheduled yes  11/05/18  Dr. Mitchel Honour  Notes to clinic:not delegated  Requested Prescriptions  Pending Prescriptions Disp Refills   ALPRAZolam (XANAX) 1 MG tablet [Pharmacy Med Name: ALPRAZOLAM 1 MG TABLET] 30 tablet 0    Sig: TAKE 1 TABLET BY MOUTH EVERY DAY     Not Delegated - Psychiatry:  Anxiolytics/Hypnotics Failed - 10/19/2018  1:11 PM      Failed - This refill cannot be delegated      Failed - Urine Drug Screen completed in last 360 days.      Passed - Valid encounter within last 6 months    Recent Outpatient Visits          3 months ago Generalized anxiety disorder   [facility] at [place], [place], MD   3 months ago Intractable chronic migraine without aura and without status migrainosus   Primary Care at [facility], [place], MD   4 months ago Hypotension, unspecified hypotension type   Primary Care at [place], [place]   4 months ago Heat exhaustion, initial encounter   Primary Care at [facility], [place], MD   5 months ago Dizziness   Primary Care at [facility], Ines Bloomer, MD      Future Appointments            In 2 weeks Sagardia, Ines Bloomer, MD Primary Care at [place], [facility]

## 2018-11-05 ENCOUNTER — Ambulatory Visit: Payer: 59 | Admitting: Emergency Medicine

## 2018-12-12 ENCOUNTER — Ambulatory Visit: Payer: 59 | Admitting: Emergency Medicine

## 2019-01-16 ENCOUNTER — Ambulatory Visit: Payer: 59 | Admitting: Emergency Medicine

## 2019-01-26 ENCOUNTER — Other Ambulatory Visit: Payer: Self-pay | Admitting: Emergency Medicine

## 2019-01-26 DIAGNOSIS — F418 Other specified anxiety disorders: Secondary | ICD-10-CM

## 2019-02-25 ENCOUNTER — Telehealth (INDEPENDENT_AMBULATORY_CARE_PROVIDER_SITE_OTHER): Payer: 59 | Admitting: Emergency Medicine

## 2019-02-25 ENCOUNTER — Ambulatory Visit: Payer: 59 | Admitting: Emergency Medicine

## 2019-02-25 ENCOUNTER — Other Ambulatory Visit: Payer: Self-pay

## 2019-02-25 DIAGNOSIS — F418 Other specified anxiety disorders: Secondary | ICD-10-CM

## 2019-02-25 DIAGNOSIS — F411 Generalized anxiety disorder: Secondary | ICD-10-CM | POA: Diagnosis not present

## 2019-02-25 DIAGNOSIS — I10 Essential (primary) hypertension: Secondary | ICD-10-CM

## 2019-02-25 DIAGNOSIS — G43719 Chronic migraine without aura, intractable, without status migrainosus: Secondary | ICD-10-CM

## 2019-02-25 DIAGNOSIS — F17210 Nicotine dependence, cigarettes, uncomplicated: Secondary | ICD-10-CM | POA: Diagnosis not present

## 2019-02-25 MED ORDER — TRAMADOL HCL 50 MG PO TABS: 50.0000 mg | ORAL_TABLET | Freq: Three times a day (TID) | ORAL | 0 refills | Status: DC | PRN

## 2019-02-25 MED ORDER — VARENICLINE TARTRATE 0.5 MG X 11 & 1 MG X 42 PO MISC: ORAL | 0 refills | Status: DC

## 2019-02-25 MED ORDER — ALPRAZOLAM 1 MG PO TABS: 0.5000 mg | ORAL_TABLET | Freq: Every day | ORAL | 1 refills | Status: DC | PRN

## 2019-02-25 NOTE — Progress Notes (Signed)
Telemedicine Encounter- SOAP NOTE Established Patient  This telephone encounter was conducted with the patient's (or proxy's) verbal consent via audio telecommunications: yes/no: Yes Patient was instructed to have this encounter in a suitably private space; and to only have persons present to whom they give permission to participate. In addition, patient identity was confirmed by use of name plus two identifiers (DOB and address).  I discussed the limitations, risks, security and privacy concerns of performing an evaluation and management service by telephone and the availability of in person appointments. I also discussed with the patient that there may be a patient responsible charge related to this service. The patient expressed understanding and agreed to proceed.  I spent a total of TIME; 0 MIN TO 60 MIN: 15 minutes talking with the patient or their proxy.  No chief complaint on file.   Subjective   Amy Charles is a 48 y.o. female established patient. Telephone visit today for follow-up on chronic medical problems.  She has the following: 1.  Essential hypertension, on lisinopril 20 mg a day, well-controlled. 2.  Chronic anxiety, on Lexapro and Xanax as needed, increased lately due to coronavirus pandemic. 3.  Smoker, requesting Chantix starter pack.  Has tried it before with success. 4.  Chronic migraine headaches, requesting tramadol as needed. Otherwise doing well without any other complaints or concerns today.  HPI   Patient Active Problem List   Diagnosis Date Noted  . Chronic diarrhea 07/18/2018  . Situational anxiety 07/18/2018  . Arterial hypotension 06/10/2018  . Essential hypertension 05/14/2018  . Generalized anxiety disorder 05/14/2018  . Chronic daily headache 05/18/2012    Past Medical History:  Diagnosis Date  . Allergy   . Anxiety   . Cancer ([place])   . Depression   . Gallstones   . GERD (gastroesophageal reflux disease)   . IBS (irritable bowel  syndrome)   . Migraines     Current Outpatient Medications  Medication Sig Dispense Refill  . ALPRAZolam (XANAX) 1 MG tablet TAKE 1 TABLET BY MOUTH EVERY DAY 30 tablet 0  . levocetirizine (XYZAL) 5 MG tablet Take 1 tablet (5 mg total) by mouth every evening. 90 tablet 3  . lisinopril (PRINIVIL,ZESTRIL) 10 MG tablet Take 1 tablet (10 mg total) by mouth daily. 90 tablet 3  . CHANTIX CONTINUING MONTH PAK 1 MG tablet TAKE 1 TABLET BY MOUTH TWICE A DAY (Patient not taking: Reported on 02/25/2019) 168 tablet 0  . escitalopram (LEXAPRO) 10 MG tablet Take 1 tablet (10 mg total) by mouth daily. 90 tablet 1  . traMADol (ULTRAM) 50 MG tablet Take 1 tablet (50 mg total) by mouth every 8 (eight) hours as needed. (Patient not taking: Reported on 02/25/2019) 20 tablet 0   No current facility-administered medications for this visit.     Allergies  Allergen Reactions  . Augmentin [Amoxicillin-Pot Clavulanate] Nausea And Vomiting  . Sumatriptan     convulsions  . Tetracyclines & Related     Social History   Socioeconomic History  . Marital status: Married    Spouse name: Not on file  . Number of children: Not on file  . Years of education: Not on file  . Highest education level: Not on file  Occupational History  . Not on file  Social Needs  . Financial resource strain: Not on file  . Food insecurity:    Worry: Not on file    Inability: Not on file  . Transportation needs:  Medical: Not on file    Non-medical: Not on file  Tobacco Use  . Smoking status: Current Every Day Smoker    Packs/day: 0.50    Years: 30.00    Pack years: 15.00    Types: Cigarettes  . Smokeless tobacco: Never Used  . Tobacco comment: off/on  Substance and Sexual Activity  . Alcohol use: No  . Drug use: No  . Sexual activity: Not on file  Lifestyle  . Physical activity:    Days per week: Not on file    Minutes per session: Not on file  . Stress: Not on file  Relationships  . Social connections:    Talks  on phone: Not on file    Gets together: Not on file    Attends religious service: Not on file    Active member of club or organization: Not on file    Attends meetings of clubs or organizations: Not on file    Relationship status: Not on file  . Intimate partner violence:    Fear of current or ex partner: Not on file    Emotionally abused: Not on file    Physically abused: Not on file    Forced sexual activity: Not on file  Other Topics Concern  . Not on file  Social History Narrative  . Not on file    Review of Systems  Constitutional: Negative.  Negative for chills and fever.  HENT: Negative for sore throat.   Eyes: Negative for discharge and redness.  Respiratory: Negative for cough and shortness of breath.   Cardiovascular: Negative for chest pain and palpitations.  Gastrointestinal: Negative for abdominal pain, diarrhea, nausea and vomiting.  Genitourinary: Negative.  Negative for dysuria and hematuria.  Musculoskeletal: Negative for myalgias.  Skin: Negative.  Negative for rash.  Neurological: Negative.  Negative for dizziness and headaches.  Endo/Heme/Allergies: Negative.   Psychiatric/Behavioral: The patient is nervous/anxious.     Objective   Vitals as reported by the patient: None available There were no vitals filed for this visit.  There are no diagnoses linked to this encounter. Diagnoses and all orders for this visit:  Generalized anxiety disorder  Situational anxiety -     ALPRAZolam (XANAX) 1 MG tablet; Take 0.5 tablets (0.5 mg total) by mouth daily as needed for anxiety.  Intractable chronic migraine without aura and without status migrainosus -     traMADol (ULTRAM) 50 MG tablet; Take 1 tablet (50 mg total) by mouth every 8 (eight) hours as needed for severe pain.  Essential hypertension  Other orders -     varenicline (CHANTIX STARTING MONTH PAK) 0.5 MG X 11 & 1 MG X 42 tablet; Take one 0.5 mg tablet by mouth once daily for 3 days, then increase  to one 0.5 mg tablet twice daily for 4 days, then increase to one 1 mg tablet twice daily.   No medical concerns identified during this visit. Continue present medications.  No changes. Follow-up in 3 to 6 months.  I discussed the assessment and treatment plan with the patient. The patient was provided an opportunity to ask questions and all were answered. The patient agreed with the plan and demonstrated an understanding of the instructions.   The patient was advised to call back or seek an in-person evaluation if the symptoms worsen or if the condition fails to improve as anticipated.  I provided 15 minutes of non-face-to-face time during this encounter.  Horald Pollen, MD  Primary Care at [facility]

## 2019-05-25 ENCOUNTER — Telehealth: Payer: Self-pay | Admitting: Emergency Medicine

## 2019-05-25 NOTE — Telephone Encounter (Signed)
Sent patient message

## 2019-05-27 ENCOUNTER — Telehealth: Payer: 59 | Admitting: Emergency Medicine

## 2019-05-27 IMAGING — CT CT ABD-PELV W/ CM
2 of 5 series · 16 of 46 positions shown, 18 images · IV contrast (APPLIED)
Comparison: None

CLINICAL DATA: Abdominal pain, had gallbladder removed on
12/25/2017, 2 days ago developed severe RIGHT upper quadrant pain,
nausea and fever, question abscess

EXAM:
CT ABDOMEN AND PELVIS WITH CONTRAST
TECHNIQUE: Multidetector CT imaging of the abdomen and pelvis was performed
using the standard protocol following bolus administration of
intravenous contrast. Sagittal and coronal MPR images reconstructed
from axial data set.
CONTRAST:  100mL 6PSKVX-299 IOPAMIDOL (6PSKVX-299) INJECTION 61% IV.
No oral contrast administered.

[Series 2: axial st · axial · 0.83mm/px · z∈[-574,-170]mm · 13 of 95 slices shown, 15 images]
[im 7/95  soft-tissue]
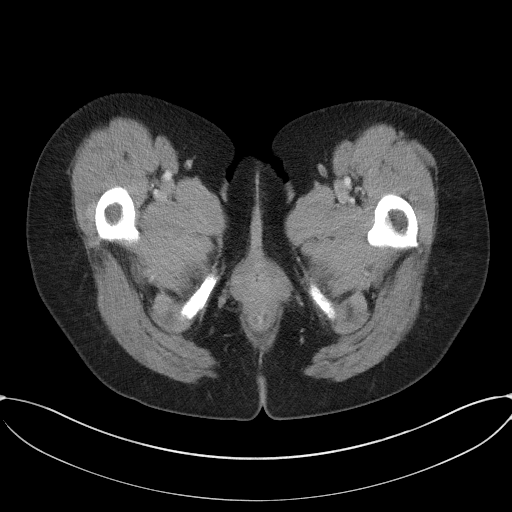
[im 7/95  bone]
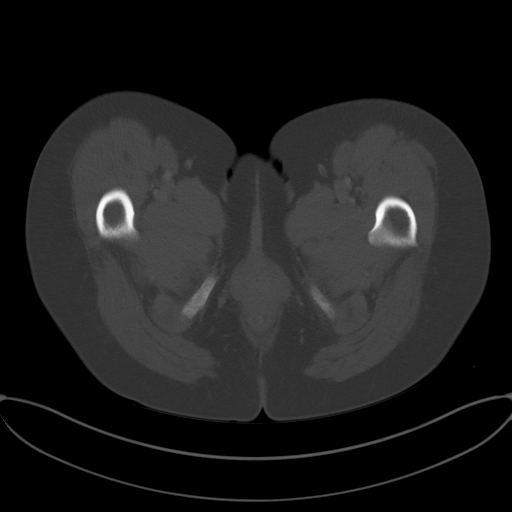
[im 14/95  soft-tissue]
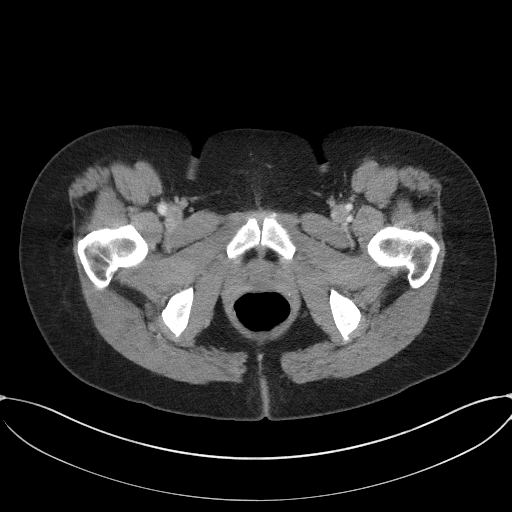
[im 21/95  soft-tissue]
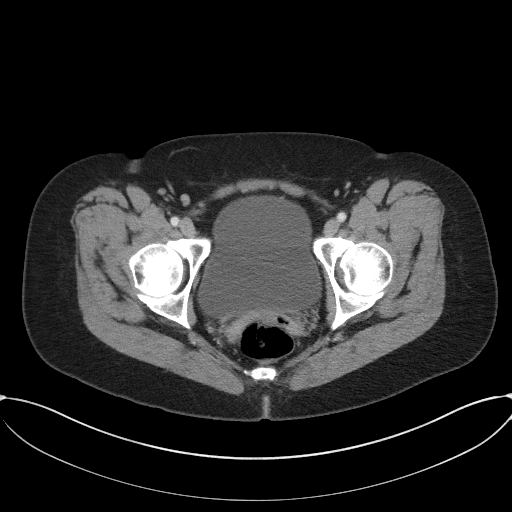
[im 27/95  soft-tissue]
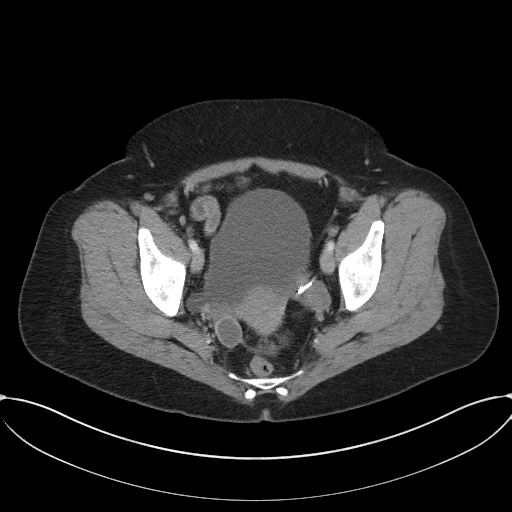
[im 34/95  soft-tissue]
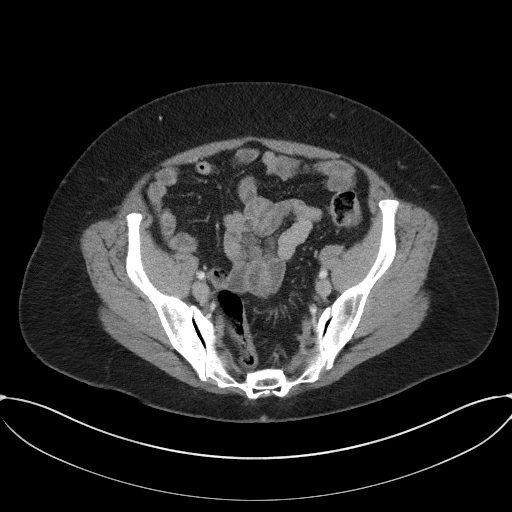
[im 41/95  soft-tissue]
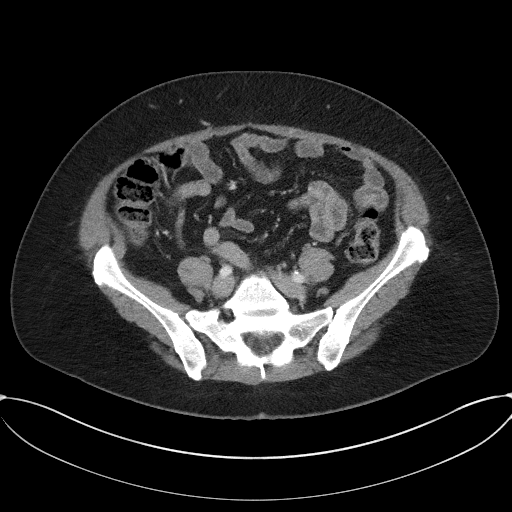
[im 48/95  soft-tissue]
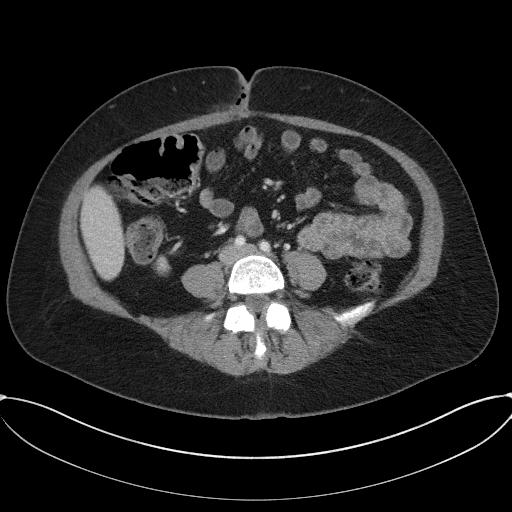
[im 54/95  soft-tissue]
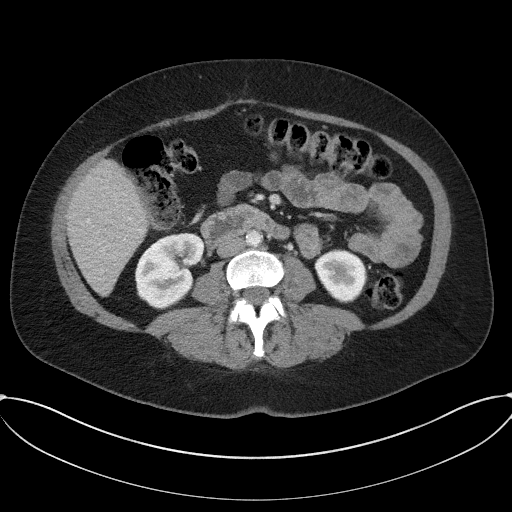
[im 61/95  soft-tissue]
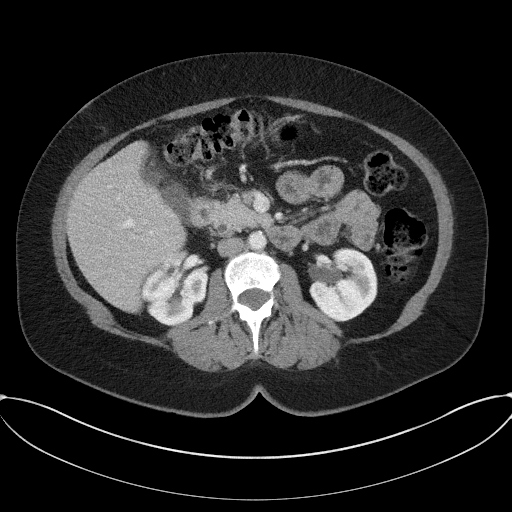
[im 61/95  bone]
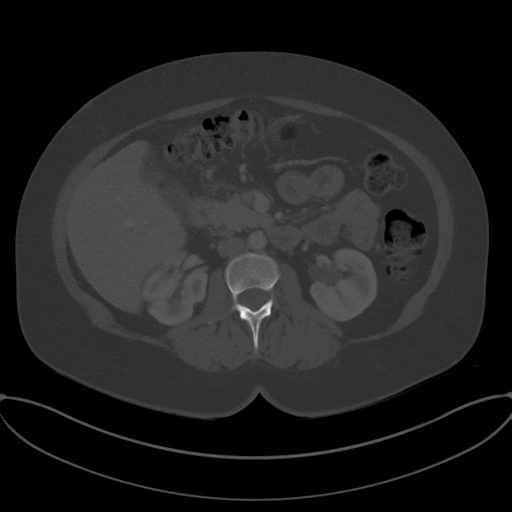
[im 68/95  soft-tissue]
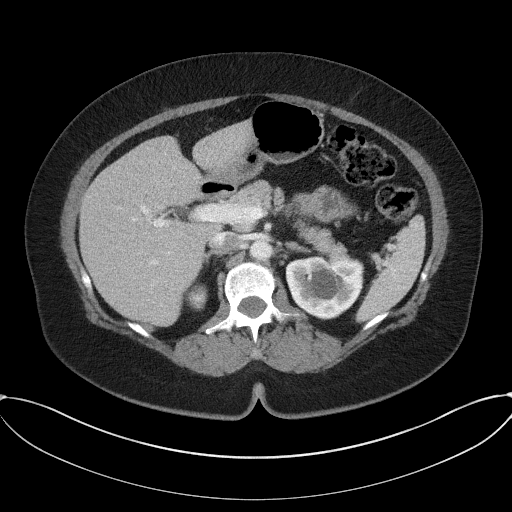
[im 74/95  soft-tissue]
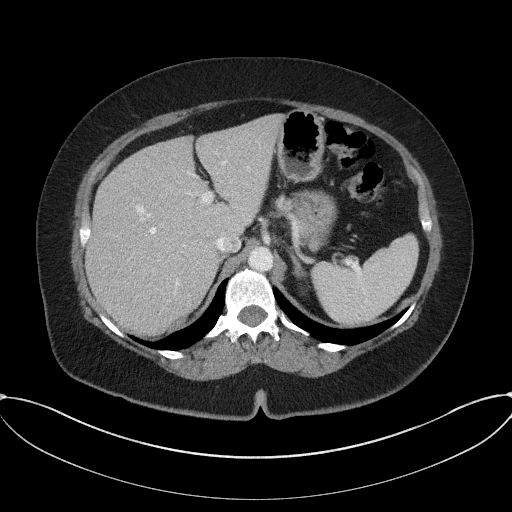
[im 81/95  soft-tissue]
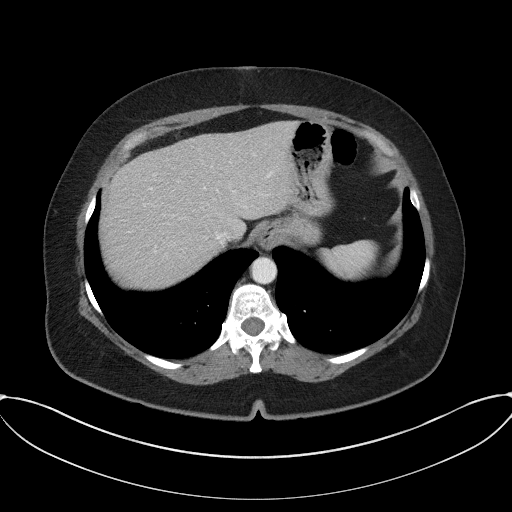
[im 88/95  soft-tissue]
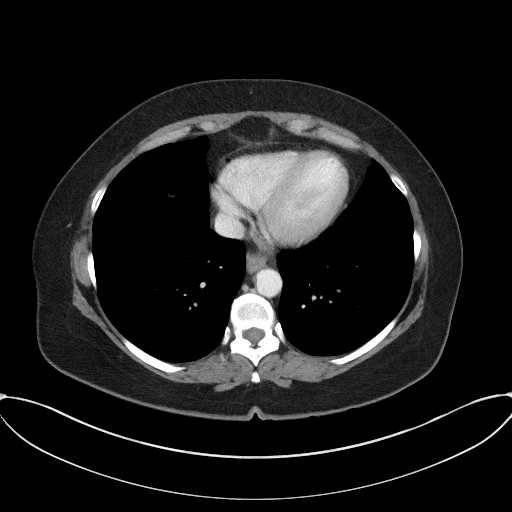

[Series 5: coronal st · coronal · 0.80mm/px · 3 of 96 slices shown]
[im 32/96  soft-tissue]
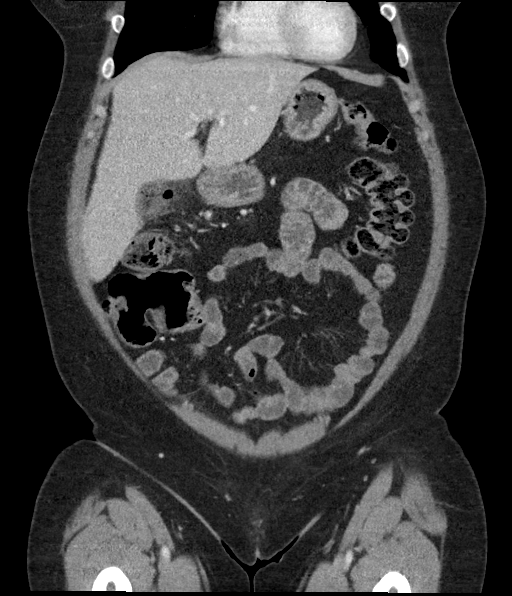
[im 43/96  soft-tissue]
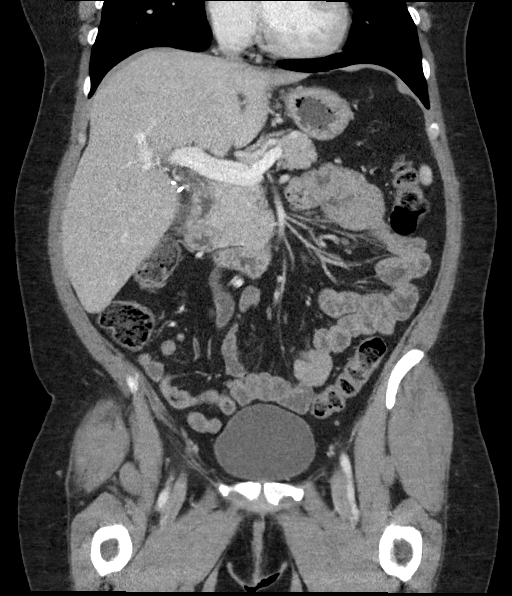
[im 53/96  soft-tissue]
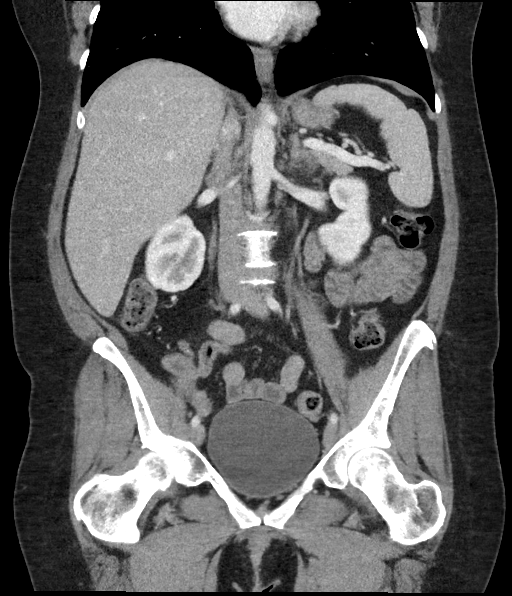

[16 of 46 positions shown; findings below may reference images not displayed]

FINDINGS: Lower chest: Lung bases clear

Hepatobiliary: Liver normal appearance. No biliary dilatation. Small
fluid collection with foci of gas identified at gallbladder fossa
5.4 x 2.7 x 2.7 cm, most likely represents postsurgical changes with
fluid and packing material/Surgicel at the surgical bed post
cholecystectomy though cannot entirely exclude infection by CT. No
significant surrounding inflammatory/infiltrative changes.

Pancreas: Normal appearance

The

Spleen: Normal appearance

Adrenals/Urinary Tract: Adrenal glands normal appearance. Small cyst
upper pole LEFT kidney. Kidneys, ureters, and bladder normal
appearance.

Stomach/Bowel: Normal appendix. Stomach and bowel loops normal
appearance for technique.

Vascular/Lymphatic: Atherosclerotic calcifications aorta without
aneurysm. No adenopathy. Few pelvic phleboliths.

Reproductive: Prior tubal ligation. Uterus and ovaries otherwise
unremarkable.

Other: No free air or free fluid. Tiny foci of gas at the umbilical
trocar track without significant fluid collection to suggest
subcutaneous abscess. No hernia.

Musculoskeletal: No acute osseous findings.
IMPRESSION: Small fluid collection with foci of gas at gallbladder fossa, may
represent postsurgical changes or fluid and packing
material/Surgicel in gallbladder fossa post cholecystectomy though
CT is not able to completely exclude infection; if an infected
collection remains a clinical concern consider either follow-up
imaging or aspiration.

Foci of gas at the umbilical trocar track from laparoscopy without
abdominal wall fluid collection to suggest abscess.

Remainder of exam unremarkable.

## 2019-05-28 ENCOUNTER — Other Ambulatory Visit: Payer: Self-pay

## 2019-05-28 ENCOUNTER — Telehealth: Payer: 59 | Admitting: Emergency Medicine

## 2019-07-24 ENCOUNTER — Other Ambulatory Visit: Payer: Self-pay | Admitting: Emergency Medicine

## 2019-07-24 DIAGNOSIS — I10 Essential (primary) hypertension: Secondary | ICD-10-CM

## 2019-07-24 NOTE — Telephone Encounter (Signed)
Forwarding medication refill request to the clinical pool for review. 

## 2019-10-14 ENCOUNTER — Ambulatory Visit: Payer: 59 | Admitting: Emergency Medicine

## 2019-10-15 ENCOUNTER — Encounter: Payer: Self-pay | Admitting: Emergency Medicine

## 2019-10-15 ENCOUNTER — Other Ambulatory Visit: Payer: Self-pay

## 2019-10-15 ENCOUNTER — Ambulatory Visit (INDEPENDENT_AMBULATORY_CARE_PROVIDER_SITE_OTHER): Payer: 59 | Admitting: Emergency Medicine

## 2019-10-15 VITALS — BP 143/83 | HR 87 | Temp 98.5°F | Resp 16 | Ht 65.0 in | Wt 216.8 lb

## 2019-10-15 DIAGNOSIS — G43719 Chronic migraine without aura, intractable, without status migrainosus: Secondary | ICD-10-CM

## 2019-10-15 DIAGNOSIS — F17200 Nicotine dependence, unspecified, uncomplicated: Secondary | ICD-10-CM

## 2019-10-15 DIAGNOSIS — R350 Frequency of micturition: Secondary | ICD-10-CM

## 2019-10-15 DIAGNOSIS — R300 Dysuria: Secondary | ICD-10-CM

## 2019-10-15 DIAGNOSIS — F418 Other specified anxiety disorders: Secondary | ICD-10-CM

## 2019-10-15 DIAGNOSIS — Z23 Encounter for immunization: Secondary | ICD-10-CM

## 2019-10-15 DIAGNOSIS — Z13220 Encounter for screening for lipoid disorders: Secondary | ICD-10-CM

## 2019-10-15 DIAGNOSIS — Z1231 Encounter for screening mammogram for malignant neoplasm of breast: Secondary | ICD-10-CM

## 2019-10-15 LAB — POCT URINALYSIS DIP (MANUAL ENTRY)
Glucose, UA: NEGATIVE mg/dL
Ketones, POC UA: NEGATIVE mg/dL
Leukocytes, UA: NEGATIVE
Nitrite, UA: NEGATIVE
Protein Ur, POC: NEGATIVE mg/dL
Spec Grav, UA: >=1.03 — AB
Urobilinogen, UA: 1 U/dL
pH, UA: 6

## 2019-10-15 MED ORDER — TRAMADOL HCL 50 MG PO TABS: 50.0000 mg | ORAL_TABLET | Freq: Three times a day (TID) | ORAL | 0 refills | Status: DC | PRN

## 2019-10-15 MED ORDER — ELETRIPTAN HYDROBROMIDE 20 MG PO TABS: 20.0000 mg | ORAL_TABLET | ORAL | 2 refills | Status: DC | PRN

## 2019-10-15 MED ORDER — CHANTIX STARTING MONTH PAK 0.5 MG X 11 & 1 MG X 42 PO TABS: ORAL_TABLET | ORAL | 0 refills | Status: DC

## 2019-10-15 MED ORDER — SULFAMETHOXAZOLE-TRIMETHOPRIM 800-160 MG PO TABS: 1.0000 | ORAL_TABLET | Freq: Two times a day (BID) | ORAL | 0 refills | Status: AC

## 2019-10-15 MED ORDER — ALPRAZOLAM 1 MG PO TABS: 0.5000 mg | ORAL_TABLET | Freq: Every day | ORAL | 1 refills | Status: DC | PRN

## 2019-10-15 NOTE — Progress Notes (Signed)
[name] 48 y.o.   Chief Complaint  Patient presents with  . Migraine    x 1 month ago with nausea and  . Urinary Frequency    low back pain started yesterday   . Panic Attack    x 1 month when migraine started    HISTORY OF PRESENT ILLNESS: This is a 48 y.o. female complaining of migraine headache for 1 month along with nausea and diarrhea. Has a history of generalized anxiety disorder complaining of increased anxiety and panic attacks for the past month.  Also has a history of IBS. Also complaining of back pain, think she may have a kidney infection, has lower urinary tract symptoms with dysuria.  Has a history of frequent recurrent UTIs. Out of some of her medications, requesting refills. Has a history of generalized anxiety disorder and panic attacks, has been taking Lexapro 10 mg daily inquiring about upping the dose. Recently tested negative for COVID-19 on 10/09/2019.  HPI   Prior to Admission medications   Medication Sig Start Date End Date Taking? Authorizing Provider  ALPRAZolam Duanne Moron) 1 MG tablet Take 0.5 tablets (0.5 mg total) by mouth daily as needed for anxiety. 02/25/19  Yes Jasreet Dickie, Ines Bloomer, MD  levocetirizine (XYZAL) 5 MG tablet Take 1 tablet (5 mg total) by mouth every evening. 06/19/18  Yes Jaynee Eagles, PA-C  lisinopril (ZESTRIL) 10 MG tablet TAKE 1 TABLET BY MOUTH EVERY DAY 07/24/19  Yes Trystin Terhune, Ines Bloomer, MD  traMADol (ULTRAM) 50 MG tablet Take 1 tablet (50 mg total) by mouth every 8 (eight) hours as needed for severe pain. 02/25/19  Yes Takyia Sindt, Ines Bloomer, MD  varenicline (CHANTIX STARTING MONTH PAK) 0.5 MG X 11 & 1 MG X 42 tablet Take one 0.5 mg tablet by mouth once daily for 3 days, then increase to one 0.5 mg tablet twice daily for 4 days, then increase to one 1 mg tablet twice daily. 02/25/19  Yes Wallace Gappa, Ines Bloomer, MD  escitalopram (LEXAPRO) 10 MG tablet Take 1 tablet (10 mg total) by mouth daily. 07/18/18 10/16/18  Horald Pollen, MD     Allergies  Allergen Reactions  . Augmentin [Amoxicillin-Pot Clavulanate] Nausea And Vomiting  . Sumatriptan     convulsions  . Tetracyclines & Related     Patient Active Problem List   Diagnosis Date Noted  . Chronic diarrhea 07/18/2018  . Situational anxiety 07/18/2018  . Essential hypertension 05/14/2018  . Generalized anxiety disorder 05/14/2018  . Chronic daily headache 05/18/2012    Past Medical History:  Diagnosis Date  . Allergy   . Anxiety   . Cancer ([place])   . Depression   . Gallstones   . GERD (gastroesophageal reflux disease)   . IBS (irritable bowel syndrome)   . Migraines     Past Surgical History:  Procedure Laterality Date  . BREAST BIOPSY    . CHOLECYSTECTOMY N/A 12/25/2017   Procedure: LAPAROSCOPIC CHOLECYSTECTOMY;  Surgeon: Olean Ree, MD;  Location: [facility] ORS;  Service: General;  Laterality: N/A;  . NOVASURE ABLATION    . TUBAL LIGATION      Social History   Socioeconomic History  . Marital status: Married    Spouse name: Not on file  . Number of children: Not on file  . Years of education: Not on file  . Highest education level: Not on file  Occupational History  . Not on file  Social Needs  . Financial resource strain: Not on file  . Food insecurity  Worry: Not on file    Inability: Not on file  . Transportation needs    Medical: Not on file    Non-medical: Not on file  Tobacco Use  . Smoking status: Current Every Day Smoker    Packs/day: 0.50    Years: 30.00    Pack years: 15.00    Types: Cigarettes  . Smokeless tobacco: Never Used  . Tobacco comment: off/on  Substance and Sexual Activity  . Alcohol use: No  . Drug use: No  . Sexual activity: Not on file  Lifestyle  . Physical activity    Days per week: Not on file    Minutes per session: Not on file  . Stress: Not on file  Relationships  . Social Herbalist on phone: Not on file    Gets together: Not on file    Attends religious service: Not on file     Active member of club or organization: Not on file    Attends meetings of clubs or organizations: Not on file    Relationship status: Not on file  . Intimate partner violence    Fear of current or ex partner: Not on file    Emotionally abused: Not on file    Physically abused: Not on file    Forced sexual activity: Not on file  Other Topics Concern  . Not on file  Social History Narrative  . Not on file    Family History  Problem Relation Age of Onset  . Diabetes Mother   . Heart disease Mother   . Hyperlipidemia Mother   . Hypertension Mother   . Heart disease Father   . Hyperlipidemia Father   . Hypertension Father   . Cancer Father   . Hyperlipidemia Brother   . Hypertension Brother   . Hyperlipidemia Brother   . Hypertension Brother      Review of Systems  Constitutional: Negative.  Negative for chills and fever.  HENT: Negative.  Negative for congestion and sore throat.   Eyes: Negative.   Respiratory: Negative.  Negative for cough, hemoptysis, sputum production, shortness of breath and wheezing.   Cardiovascular: Negative.  Negative for chest pain and palpitations.  Gastrointestinal: Positive for diarrhea and nausea. Negative for abdominal pain, blood in stool, melena and vomiting.  Genitourinary: Positive for dysuria, flank pain and frequency. Negative for hematuria.  Musculoskeletal: Positive for back pain.  Skin: Negative.  Negative for rash.  Neurological: Negative.  Negative for dizziness and headaches.  Endo/Heme/Allergies: Negative.   Psychiatric/Behavioral: The patient is nervous/anxious.   All other systems reviewed and are negative.   Vitals:   10/15/19 1120  BP: (!) 143/83  Pulse: 87  Resp: 16  Temp: 98.5 F (36.9 C)  SpO2: 97%    Physical Exam Vitals signs reviewed.  Constitutional:      Appearance: Normal appearance.  HENT:     Head: Normocephalic.  Eyes:     Extraocular Movements: Extraocular movements intact.      Conjunctiva/sclera: Conjunctivae normal.     Pupils: Pupils are equal, round, and reactive to light.  Neck:     Musculoskeletal: Normal range of motion and neck supple.  Cardiovascular:     Rate and Rhythm: Normal rate and regular rhythm.     Pulses: Normal pulses.     Heart sounds: Normal heart sounds.  Pulmonary:     Effort: Pulmonary effort is normal.     Breath sounds: Normal breath sounds.  Abdominal:  General: There is no distension.     Palpations: Abdomen is soft.     Tenderness: There is no abdominal tenderness.  Musculoskeletal: Normal range of motion.  Skin:    General: Skin is warm and dry.     Capillary Refill: Capillary refill takes less than 2 seconds.  Neurological:     General: No focal deficit present.     Mental Status: She is alert and oriented to person, place, and time.     Sensory: No sensory deficit.     Motor: No weakness.     Gait: Gait normal.  Psychiatric:        Mood and Affect: Mood normal.        Behavior: Behavior normal.    A total of 25 minutes was spent in the room with the patient, greater than 50% of which was in counseling/coordination of care regarding chronic and acute medical problems, treatment and management, medications and side effects, urine results and need for antibiotic, diet and nutrition, prognosis, and need for follow-up if no better or worse in the next several days.   ASSESSMENT & PLAN: Maicee was seen today for migraine, urinary frequency and panic attack.  Diagnoses and all orders for this visit:  Dysuria -     sulfamethoxazole-trimethoprim (BACTRIM DS) 800-160 MG tablet; Take 1 tablet by mouth 2 (two) times daily for 7 days.  Situational anxiety -     ALPRAZolam (XANAX) 1 MG tablet; Take 0.5 tablets (0.5 mg total) by mouth daily as needed for anxiety.  Intractable chronic migraine without aura and without status migrainosus -     traMADol (ULTRAM) 50 MG tablet; Take 1 tablet (50 mg total) by mouth every 8 (eight)  hours as needed for severe pain. -     eletriptan (RELPAX) 20 MG tablet; Take 1 tablet (20 mg total) by mouth as needed for migraine or headache. May repeat in 2 hours if headache persists or recurs.  Urinary frequency -     CBC with Differential -     Comprehensive metabolic panel -     POCT urinalysis dipstick -     Urine culture  Encounter for screening mammogram for malignant neoplasm of breast -     MM Digital Screening; Future  Smoker -     varenicline (CHANTIX STARTING MONTH PAK) 0.5 MG X 11 & 1 MG X 42 tablet; Take one 0.5 mg tablet by mouth once daily for 3 days, then increase to one 0.5 mg tablet twice daily for 4 days, then increase to one 1 mg tablet twice daily.  Lipid screening -     Lipid panel  Other orders -     TDAP VACCINE    Patient Instructions       If you have lab work done today you will be contacted with your lab results within the next 2 weeks.  If you have not heard from [place] then please contact us. The fastest way to get your results is to register for My Chart.   IF you received an x-ray today, you will receive an invoice from [facility]. Please contact [facility] at [phone] with questions or concerns regarding your invoice.   IF you received labwork today, you will receive an invoice from [facility]. Please contact [facility] at [phone] with questions or concerns regarding your invoice.   Our billing staff will not be able to assist you with questions regarding bills from these companies.  You will be contacted with  the lab results as soon as they are available. The fastest way to get your results is to activate your My Chart account. Instructions are located on the last page of this paperwork. If you have not heard from [place] regarding the results in 2 weeks, please contact this office.     Urinary Tract Infection, Adult A urinary tract infection (UTI) is an infection of any part of the urinary tract. The urinary tract  includes:  The kidneys.  The ureters.  The bladder.  The urethra. These organs make, store, and get rid of pee (urine) in the body. What are the causes? This is caused by germs (bacteria) in your genital area. These germs grow and cause swelling (inflammation) of your urinary tract. What increases the risk? You are more likely to develop this condition if:  You have a small, thin tube (catheter) to drain pee.  You cannot control when you pee or poop (incontinence).  You are female, and: ? You use these methods to prevent pregnancy: ? A medicine that kills sperm (spermicide). ? A device that blocks sperm (diaphragm). ? You have low levels of a female hormone (estrogen). ? You are pregnant.  You have genes that add to your risk.  You are sexually active.  You take antibiotic medicines.  You have trouble peeing because of: ? A prostate that is bigger than normal, if you are female. ? A blockage in the part of your body that drains pee from the bladder (urethra). ? A kidney stone. ? A nerve condition that affects your bladder (neurogenic bladder). ? Not getting enough to drink. ? Not peeing often enough.  You have other conditions, such as: ? Diabetes. ? A weak disease-fighting system (immune system). ? Sickle cell disease. ? Gout. ? Injury of the spine. What are the signs or symptoms? Symptoms of this condition include:  Needing to pee right away (urgently).  Peeing often.  Peeing small amounts often.  Pain or burning when peeing.  Blood in the pee.  Pee that smells bad or not like normal.  Trouble peeing.  Pee that is cloudy.  Fluid coming from the vagina, if you are female.  Pain in the belly or lower back. Other symptoms include:  Throwing up (vomiting).  No urge to eat.  Feeling mixed up (confused).  Being tired and grouchy (irritable).  A fever.  Watery poop (diarrhea). How is this treated? This condition may be treated with:   Antibiotic medicine.  Other medicines.  Drinking enough water. Follow these instructions at home:  Medicines  Take over-the-counter and prescription medicines only as told by your doctor.  If you were prescribed an antibiotic medicine, take it as told by your doctor. Do not stop taking it even if you start to feel better. General instructions  Make sure you: ? Pee until your bladder is empty. ? Do not hold pee for a long time. ? Empty your bladder after sex. ? Wipe from front to back after pooping if you are a female. Use each tissue one time when you wipe.  Drink enough fluid to keep your pee pale yellow.  Keep all follow-up visits as told by your doctor. This is important. Contact a doctor if:  You do not get better after 1-2 days.  Your symptoms go away and then come back. Get help right away if:  You have very bad back pain.  You have very bad pain in your lower belly.  You have a fever.  You are sick to your stomach (nauseous).  You are throwing up. Summary  A urinary tract infection (UTI) is an infection of any part of the urinary tract.  This condition is caused by germs in your genital area.  There are many risk factors for a UTI. These include having a small, thin tube to drain pee and not being able to control when you pee or poop.  Treatment includes antibiotic medicines for germs.  Drink enough fluid to keep your pee pale yellow. This information is not intended to replace advice given to you by your health care provider. Make sure you discuss any questions you have with your health care provider. Document Released: 04/30/2008 Document Revised: 10/30/2018 Document Reviewed: 05/22/2018 Elsevier Patient Education  2020 [employer].  Migraine Headache A migraine headache is a very strong throbbing pain on one side or both sides of your head. This type of headache can also cause other symptoms. It can last from 4 hours to 3 days. Talk with your doctor  about what things may bring on (trigger) this condition. What are the causes? The exact cause of this condition is not known. This condition may be triggered or caused by:  Drinking alcohol.  Smoking.  Taking medicines, such as: ? Medicine used to treat chest pain (nitroglycerin). ? Birth control pills. ? Estrogen. ? Some blood pressure medicines.  Eating or drinking certain products.  Doing physical activity. Other things that may trigger a migraine headache include:  Having a menstrual period.  Pregnancy.  Hunger.  Stress.  Not getting enough sleep or getting too much sleep.  Weather changes.  Tiredness (fatigue). What increases the risk?  Being 58-56 years old.  Being female.  Having a family history of migraine headaches.  Being Caucasian.  Having depression or anxiety.  Being very overweight. What are the signs or symptoms?  A throbbing pain. This pain may: ? Happen in any area of the head, such as on one side or both sides. ? Make it hard to do daily activities. ? Get worse with physical activity. ? Get worse around bright lights or loud noises.  Other symptoms may include: ? Feeling sick to your stomach (nauseous). ? Vomiting. ? Dizziness. ? Being sensitive to bright lights, loud noises, or smells.  Before you get a migraine headache, you may get warning signs (an aura). An aura may include: ? Seeing flashing lights or having blind spots. ? Seeing bright spots, halos, or zigzag lines. ? Having tunnel vision or blurred vision. ? Having numbness or a tingling feeling. ? Having trouble talking. ? Having weak muscles.  Some people have symptoms after a migraine headache (postdromal phase), such as: ? Tiredness. ? Trouble thinking (concentrating). How is this treated?  Taking medicines that: ? Relieve pain. ? Relieve the feeling of being sick to your stomach. ? Prevent migraine headaches.  Treatment may also include: ? Having  acupuncture. ? Avoiding foods that bring on migraine headaches. ? Learning ways to control your body functions (biofeedback). ? Therapy to help you know and deal with negative thoughts (cognitive behavioral therapy). Follow these instructions at home: Medicines  Take over-the-counter and prescription medicines only as told by your doctor.  Ask your doctor if the medicine prescribed to you: ? Requires you to avoid driving or using heavy machinery. ? Can cause trouble pooping (constipation). You may need to take these steps to prevent or treat trouble pooping:  Drink enough fluid to keep your pee (urine) pale yellow.  Take over-the-counter or prescription medicines.  Eat foods that are high in fiber. These include beans, whole grains, and fresh fruits and vegetables.  Limit foods that are high in fat and sugar. These include fried or sweet foods. Lifestyle  Do not drink alcohol.  Do not use any products that contain nicotine or tobacco, such as cigarettes, e-cigarettes, and chewing tobacco. If you need help quitting, ask your doctor.  Get at least 8 hours of sleep every night.  Limit and deal with stress. General instructions      Keep a journal to find out what may bring on your migraine headaches. For example, write down: ? What you eat and drink. ? How much sleep you get. ? Any change in what you eat or drink. ? Any change in your medicines.  If you have a migraine headache: ? Avoid things that make your symptoms worse, such as bright lights. ? It may help to lie down in a dark, quiet room. ? Do not drive or use heavy machinery. ? Ask your doctor what activities are safe for you.  Keep all follow-up visits as told by your doctor. This is important. Contact a doctor if:  You get a migraine headache that is different or worse than others you have had.  You have more than 15 headache days in one month. Get help right away if:  Your migraine headache gets very bad.   Your migraine headache lasts longer than 72 hours.  You have a fever.  You have a stiff neck.  You have trouble seeing.  Your muscles feel weak or like you cannot control them.  You start to lose your balance a lot.  You start to have trouble walking.  You pass out (faint).  You have a seizure. Summary  A migraine headache is a very strong throbbing pain on one side or both sides of your head. These headaches can also cause other symptoms.  This condition may be treated with medicines and changes to your lifestyle.  Keep a journal to find out what may bring on your migraine headaches.  Contact a doctor if you get a migraine headache that is different or worse than others you have had.  Contact your doctor if you have more than 15 headache days in a month. This information is not intended to replace advice given to you by your health care provider. Make sure you discuss any questions you have with your health care provider. Document Released: 08/21/2008 Document Revised: 03/06/2019 Document Reviewed: 12/25/2018 Elsevier Patient Education  2020 [employer]      Agustina Caroli, MD [facility]

## 2019-10-15 NOTE — Patient Instructions (Addendum)
If you have lab work done today you will be contacted with your lab results within the next 2 weeks.  If you have not heard from [place] then please contact us. The fastest way to get your results is to register for My Chart.   IF you received an x-ray today, you will receive an invoice from [facility]. Please contact [facility] at [phone] with questions or concerns regarding your invoice.   IF you received labwork today, you will receive an invoice from [facility]. Please contact [facility] at [phone] with questions or concerns regarding your invoice.   Our billing staff will not be able to assist you with questions regarding bills from these companies.  You will be contacted with the lab results as soon as they are available. The fastest way to get your results is to activate your My Chart account. Instructions are located on the last page of this paperwork. If you have not heard from [place] regarding the results in 2 weeks, please contact this office.     Urinary Tract Infection, Adult A urinary tract infection (UTI) is an infection of any part of the urinary tract. The urinary tract includes:  The kidneys.  The ureters.  The bladder.  The urethra. These organs make, store, and get rid of pee (urine) in the body. What are the causes? This is caused by germs (bacteria) in your genital area. These germs grow and cause swelling (inflammation) of your urinary tract. What increases the risk? You are more likely to develop this condition if:  You have a small, thin tube (catheter) to drain pee.  You cannot control when you pee or poop (incontinence).  You are female, and: ? You use these methods to prevent pregnancy: ? A medicine that kills sperm (spermicide). ? A device that blocks sperm (diaphragm). ? You have low levels of a female hormone (estrogen). ? You are pregnant.  You have genes that add to your risk.  You are sexually active.  You take  antibiotic medicines.  You have trouble peeing because of: ? A prostate that is bigger than normal, if you are female. ? A blockage in the part of your body that drains pee from the bladder (urethra). ? A kidney stone. ? A nerve condition that affects your bladder (neurogenic bladder). ? Not getting enough to drink. ? Not peeing often enough.  You have other conditions, such as: ? Diabetes. ? A weak disease-fighting system (immune system). ? Sickle cell disease. ? Gout. ? Injury of the spine. What are the signs or symptoms? Symptoms of this condition include:  Needing to pee right away (urgently).  Peeing often.  Peeing small amounts often.  Pain or burning when peeing.  Blood in the pee.  Pee that smells bad or not like normal.  Trouble peeing.  Pee that is cloudy.  Fluid coming from the vagina, if you are female.  Pain in the belly or lower back. Other symptoms include:  Throwing up (vomiting).  No urge to eat.  Feeling mixed up (confused).  Being tired and grouchy (irritable).  A fever.  Watery poop (diarrhea). How is this treated? This condition may be treated with:  Antibiotic medicine.  Other medicines.  Drinking enough water. Follow these instructions at home:  Medicines  Take over-the-counter and prescription medicines only as told by your doctor.  If you were prescribed an antibiotic medicine, take it as told by your doctor. Do not stop taking it even if  you start to feel better. General instructions  Make sure you: ? Pee until your bladder is empty. ? Do not hold pee for a long time. ? Empty your bladder after sex. ? Wipe from front to back after pooping if you are a female. Use each tissue one time when you wipe.  Drink enough fluid to keep your pee pale yellow.  Keep all follow-up visits as told by your doctor. This is important. Contact a doctor if:  You do not get better after 1-2 days.  Your symptoms go away and then come  back. Get help right away if:  You have very bad back pain.  You have very bad pain in your lower belly.  You have a fever.  You are sick to your stomach (nauseous).  You are throwing up. Summary  A urinary tract infection (UTI) is an infection of any part of the urinary tract.  This condition is caused by germs in your genital area.  There are many risk factors for a UTI. These include having a small, thin tube to drain pee and not being able to control when you pee or poop.  Treatment includes antibiotic medicines for germs.  Drink enough fluid to keep your pee pale yellow. This information is not intended to replace advice given to you by your health care provider. Make sure you discuss any questions you have with your health care provider. Document Released: 04/30/2008 Document Revised: 10/30/2018 Document Reviewed: 05/22/2018 Elsevier Patient Education  2020 [employer].  Migraine Headache A migraine headache is a very strong throbbing pain on one side or both sides of your head. This type of headache can also cause other symptoms. It can last from 4 hours to 3 days. Talk with your doctor about what things may bring on (trigger) this condition. What are the causes? The exact cause of this condition is not known. This condition may be triggered or caused by:  Drinking alcohol.  Smoking.  Taking medicines, such as: ? Medicine used to treat chest pain (nitroglycerin). ? Birth control pills. ? Estrogen. ? Some blood pressure medicines.  Eating or drinking certain products.  Doing physical activity. Other things that may trigger a migraine headache include:  Having a menstrual period.  Pregnancy.  Hunger.  Stress.  Not getting enough sleep or getting too much sleep.  Weather changes.  Tiredness (fatigue). What increases the risk?  Being 2-8 years old.  Being female.  Having a family history of migraine headaches.  Being Caucasian.  Having  depression or anxiety.  Being very overweight. What are the signs or symptoms?  A throbbing pain. This pain may: ? Happen in any area of the head, such as on one side or both sides. ? Make it hard to do daily activities. ? Get worse with physical activity. ? Get worse around bright lights or loud noises.  Other symptoms may include: ? Feeling sick to your stomach (nauseous). ? Vomiting. ? Dizziness. ? Being sensitive to bright lights, loud noises, or smells.  Before you get a migraine headache, you may get warning signs (an aura). An aura may include: ? Seeing flashing lights or having blind spots. ? Seeing bright spots, halos, or zigzag lines. ? Having tunnel vision or blurred vision. ? Having numbness or a tingling feeling. ? Having trouble talking. ? Having weak muscles.  Some people have symptoms after a migraine headache (postdromal phase), such as: ? Tiredness. ? Trouble thinking (concentrating). How is this treated?  Taking  medicines that: ? Relieve pain. ? Relieve the feeling of being sick to your stomach. ? Prevent migraine headaches.  Treatment may also include: ? Having acupuncture. ? Avoiding foods that bring on migraine headaches. ? Learning ways to control your body functions (biofeedback). ? Therapy to help you know and deal with negative thoughts (cognitive behavioral therapy). Follow these instructions at home: Medicines  Take over-the-counter and prescription medicines only as told by your doctor.  Ask your doctor if the medicine prescribed to you: ? Requires you to avoid driving or using heavy machinery. ? Can cause trouble pooping (constipation). You may need to take these steps to prevent or treat trouble pooping:  Drink enough fluid to keep your pee (urine) pale yellow.  Take over-the-counter or prescription medicines.  Eat foods that are high in fiber. These include beans, whole grains, and fresh fruits and vegetables.  Limit foods that  are high in fat and sugar. These include fried or sweet foods. Lifestyle  Do not drink alcohol.  Do not use any products that contain nicotine or tobacco, such as cigarettes, e-cigarettes, and chewing tobacco. If you need help quitting, ask your doctor.  Get at least 8 hours of sleep every night.  Limit and deal with stress. General instructions      Keep a journal to find out what may bring on your migraine headaches. For example, write down: ? What you eat and drink. ? How much sleep you get. ? Any change in what you eat or drink. ? Any change in your medicines.  If you have a migraine headache: ? Avoid things that make your symptoms worse, such as bright lights. ? It may help to lie down in a dark, quiet room. ? Do not drive or use heavy machinery. ? Ask your doctor what activities are safe for you.  Keep all follow-up visits as told by your doctor. This is important. Contact a doctor if:  You get a migraine headache that is different or worse than others you have had.  You have more than 15 headache days in one month. Get help right away if:  Your migraine headache gets very bad.  Your migraine headache lasts longer than 72 hours.  You have a fever.  You have a stiff neck.  You have trouble seeing.  Your muscles feel weak or like you cannot control them.  You start to lose your balance a lot.  You start to have trouble walking.  You pass out (faint).  You have a seizure. Summary  A migraine headache is a very strong throbbing pain on one side or both sides of your head. These headaches can also cause other symptoms.  This condition may be treated with medicines and changes to your lifestyle.  Keep a journal to find out what may bring on your migraine headaches.  Contact a doctor if you get a migraine headache that is different or worse than others you have had.  Contact your doctor if you have more than 15 headache days in a month. This information  is not intended to replace advice given to you by your health care provider. Make sure you discuss any questions you have with your health care provider. Document Released: 08/21/2008 Document Revised: 03/06/2019 Document Reviewed: 12/25/2018 Elsevier Patient Education  2020 [employer].

## 2019-10-16 ENCOUNTER — Encounter: Payer: Self-pay | Admitting: Emergency Medicine

## 2019-10-16 LAB — LIPID PANEL
Chol/HDL Ratio: 6.5 ratio — ABNORMAL HIGH (ref 0.0–4.4)
Cholesterol, Total: 267 mg/dL — ABNORMAL HIGH (ref 100–199)
HDL: 41 mg/dL (ref >39)
LDL Chol Calc (NIH): 198 mg/dL — ABNORMAL HIGH (ref 0–99)
Triglycerides: 149 mg/dL (ref 0–149)
VLDL Cholesterol Cal: 28 mg/dL (ref 5–40)

## 2019-10-16 LAB — CBC WITH DIFFERENTIAL/PLATELET
Basophils Absolute: 0.1 10*3/uL (ref 0.0–0.2)
Basos: 1 %
EOS (ABSOLUTE): 0.3 10*3/uL (ref 0.0–0.4)
Eos: 4 %
Hematocrit: 42.1 % (ref 34.0–46.6)
Hemoglobin: 14.4 g/dL (ref 11.1–15.9)
Immature Grans (Abs): 0.1 10*3/uL (ref 0.0–0.1)
Immature Granulocytes: 1 %
Lymphocytes Absolute: 1.9 10*3/uL (ref 0.7–3.1)
Lymphs: 25 %
MCH: 30.5 pg (ref 26.6–33.0)
MCHC: 34.2 g/dL (ref 31.5–35.7)
MCV: 89 fL (ref 79–97)
Monocytes Absolute: 0.4 10*3/uL (ref 0.1–0.9)
Monocytes: 5 %
Neutrophils Absolute: 4.7 10*3/uL (ref 1.4–7.0)
Neutrophils: 64 %
Platelets: 282 10*3/uL (ref 150–450)
RBC: 4.72 x10E6/uL (ref 3.77–5.28)
RDW: 12.8 % (ref 11.7–15.4)
WBC: 7.4 10*3/uL (ref 3.4–10.8)

## 2019-10-16 LAB — COMPREHENSIVE METABOLIC PANEL
ALT: 38 IU/L — ABNORMAL HIGH (ref 0–32)
AST: 21 IU/L (ref 0–40)
Albumin/Globulin Ratio: 1.8 (ref 1.2–2.2)
Albumin: 4.2 g/dL (ref 3.8–4.8)
Alkaline Phosphatase: 84 IU/L (ref 39–117)
BUN/Creatinine Ratio: 18 (ref 9–23)
BUN: 14 mg/dL (ref 6–24)
Bilirubin Total: <0.2 mg/dL (ref 0.0–1.2)
CO2: 22 mmol/L (ref 20–29)
Calcium: 9.3 mg/dL (ref 8.7–10.2)
Chloride: 107 mmol/L — ABNORMAL HIGH (ref 96–106)
Creatinine, Ser: 0.8 mg/dL (ref 0.57–1.00)
GFR calc Af Amer: 102 mL/min/{1.73_m2} (ref >59)
GFR calc non Af Amer: 88 mL/min/{1.73_m2} (ref >59)
Globulin, Total: 2.4 g/dL (ref 1.5–4.5)
Glucose: 115 mg/dL — ABNORMAL HIGH (ref 65–99)
Potassium: 4 mmol/L (ref 3.5–5.2)
Sodium: 144 mmol/L (ref 134–144)
Total Protein: 6.6 g/dL (ref 6.0–8.5)

## 2019-10-17 LAB — URINE CULTURE: Organism ID, Bacteria: NO GROWTH

## 2019-10-20 ENCOUNTER — Encounter: Payer: Self-pay | Admitting: Emergency Medicine

## 2019-10-20 ENCOUNTER — Other Ambulatory Visit: Payer: Self-pay

## 2019-10-20 ENCOUNTER — Ambulatory Visit (INDEPENDENT_AMBULATORY_CARE_PROVIDER_SITE_OTHER): Payer: 59 | Admitting: Emergency Medicine

## 2019-10-20 VITALS — BP 128/82 | HR 98 | Temp 97.6°F | Resp 16 | Ht 65.0 in | Wt 212.6 lb

## 2019-10-20 DIAGNOSIS — G43719 Chronic migraine without aura, intractable, without status migrainosus: Secondary | ICD-10-CM

## 2019-10-20 NOTE — Progress Notes (Signed)
Amy Charles 48 y.o.   Chief Complaint  Patient presents with  . FLMA    paper work to complete for 10/15/2019 and per patient she has migraines  . Migraine    per patient for 1 month with nausea and vomiting    HISTORY OF PRESENT ILLNESS: This is a 48 y.o. female with history of migraine headaches here to have FMLA papers filled out. Patient gets 1-2 migraine headaches with nausea and vomiting per month. She also has some other chronic medical problems. Previous medical records and personal notes reviewed with her. Form filled out in her presence.  HPI   Prior to Admission medications   Medication Sig Start Date End Date Taking? Authorizing Provider  ALPRAZolam Duanne Moron) 1 MG tablet Take 0.5 tablets (0.5 mg total) by mouth daily as needed for anxiety. 10/15/19  Yes Lorik Guo, Ines Bloomer, MD  eletriptan (RELPAX) 20 MG tablet Take 1 tablet (20 mg total) by mouth as needed for migraine or headache. May repeat in 2 hours if headache persists or recurs. 10/15/19  Yes Corey Laski, Ines Bloomer, MD  levocetirizine (XYZAL) 5 MG tablet Take 1 tablet (5 mg total) by mouth every evening. 06/19/18  Yes Jaynee Eagles, PA-C  lisinopril (ZESTRIL) 10 MG tablet TAKE 1 TABLET BY MOUTH EVERY DAY 07/24/19  Yes Rafael Quesada, Ines Bloomer, MD  sulfamethoxazole-trimethoprim (BACTRIM DS) 800-160 MG tablet Take 1 tablet by mouth 2 (two) times daily for 7 days. 10/15/19 10/22/19 Yes Tanny Harnack, Ines Bloomer, MD  traMADol (ULTRAM) 50 MG tablet Take 1 tablet (50 mg total) by mouth every 8 (eight) hours as needed for severe pain. 10/15/19  Yes Guido Comp, Ines Bloomer, MD  varenicline (CHANTIX STARTING MONTH PAK) 0.5 MG X 11 & 1 MG X 42 tablet Take one 0.5 mg tablet by mouth once daily for 3 days, then increase to one 0.5 mg tablet twice daily for 4 days, then increase to one 1 mg tablet twice daily. 10/15/19  Yes Avital Dancy, Ines Bloomer, MD  escitalopram (LEXAPRO) 10 MG tablet Take 1 tablet (10 mg total) by mouth daily. 07/18/18  10/16/18  Horald Pollen, MD    Allergies  Allergen Reactions  . Augmentin [Amoxicillin-Pot Clavulanate] Nausea And Vomiting  . Sumatriptan     convulsions  . Tetracyclines & Related     Patient Active Problem List   Diagnosis Date Noted  . Chronic diarrhea 07/18/2018  . Situational anxiety 07/18/2018  . Essential hypertension 05/14/2018  . Generalized anxiety disorder 05/14/2018  . Chronic daily headache 05/18/2012    Past Medical History:  Diagnosis Date  . Allergy   . Anxiety   . Cancer ([place])   . Depression   . Gallstones   . GERD (gastroesophageal reflux disease)   . IBS (irritable bowel syndrome)   . Migraines     Past Surgical History:  Procedure Laterality Date  . BREAST BIOPSY    . CHOLECYSTECTOMY N/A 12/25/2017   Procedure: LAPAROSCOPIC CHOLECYSTECTOMY;  Surgeon: Olean Ree, MD;  Location: [facility] ORS;  Service: General;  Laterality: N/A;  . NOVASURE ABLATION    . TUBAL LIGATION      Social History   Socioeconomic History  . Marital status: Married    Spouse name: Not on file  . Number of children: Not on file  . Years of education: Not on file  . Highest education level: Not on file  Occupational History  . Not on file  Social Needs  . Financial resource strain: Not on file  .  Food insecurity    Worry: Not on file    Inability: Not on file  . Transportation needs    Medical: Not on file    Non-medical: Not on file  Tobacco Use  . Smoking status: Current Every Day Smoker    Packs/day: 0.50    Years: 30.00    Pack years: 15.00    Types: Cigarettes  . Smokeless tobacco: Never Used  . Tobacco comment: off/on  Substance and Sexual Activity  . Alcohol use: No  . Drug use: No  . Sexual activity: Not on file  Lifestyle  . Physical activity    Days per week: Not on file    Minutes per session: Not on file  . Stress: Not on file  Relationships  . Social Herbalist on phone: Not on file    Gets together: Not on file     Attends religious service: Not on file    Active member of club or organization: Not on file    Attends meetings of clubs or organizations: Not on file    Relationship status: Not on file  . Intimate partner violence    Fear of current or ex partner: Not on file    Emotionally abused: Not on file    Physically abused: Not on file    Forced sexual activity: Not on file  Other Topics Concern  . Not on file  Social History Narrative  . Not on file    Family History  Problem Relation Age of Onset  . Diabetes Mother   . Heart disease Mother   . Hyperlipidemia Mother   . Hypertension Mother   . Heart disease Father   . Hyperlipidemia Father   . Hypertension Father   . Cancer Father   . Hyperlipidemia Brother   . Hypertension Brother   . Hyperlipidemia Brother   . Hypertension Brother      Review of Systems  Constitutional: Negative.  Negative for chills and fever.  HENT: Negative.  Negative for congestion and sore throat.   Eyes: Negative.   Respiratory: Negative.  Negative for cough and shortness of breath.   Cardiovascular: Negative.  Negative for chest pain and palpitations.  Gastrointestinal: Negative.  Negative for abdominal pain, diarrhea, nausea and vomiting.  Genitourinary: Negative.  Negative for dysuria and hematuria.  Musculoskeletal: Negative.  Negative for back pain, myalgias and neck pain.  Skin: Negative.  Negative for rash.  Neurological: Positive for dizziness and headaches. Negative for sensory change, speech change and focal weakness.  All other systems reviewed and are negative.  Vitals:   10/20/19 1540  BP: 128/82  Pulse: 98  Resp: 16  Temp: 97.6 F (36.4 C)  SpO2: 96%     Physical Exam Vitals signs reviewed.  Constitutional:      Appearance: Normal appearance.  HENT:     Head: Normocephalic.  Eyes:     Extraocular Movements: Extraocular movements intact.     Pupils: Pupils are equal, round, and reactive to light.  Neck:      Musculoskeletal: Normal range of motion and neck supple.  Cardiovascular:     Rate and Rhythm: Normal rate.  Pulmonary:     Effort: Pulmonary effort is normal.  Musculoskeletal: Normal range of motion.  Skin:    General: Skin is warm and dry.  Neurological:     General: No focal deficit present.     Mental Status: She is alert and oriented to person, place, and time.  Psychiatric:        Mood and Affect: Mood normal.        Behavior: Behavior normal.      ASSESSMENT & PLAN: Caithlin was seen today for flma and migraine.  Diagnoses and all orders for this visit:  Intractable chronic migraine without aura and without status migrainosus    Patient Instructions       If you have lab work done today you will be contacted with your lab results within the next 2 weeks.  If you have not heard from [place] then please contact us. The fastest way to get your results is to register for My Chart.   IF you received an x-ray today, you will receive an invoice from [facility]. Please contact [facility] at [phone] with questions or concerns regarding your invoice.   IF you received labwork today, you will receive an invoice from [place]. Please contact [facility] at [phone] with questions or concerns regarding your invoice.   Our billing staff will not be able to assist you with questions regarding bills from these companies.  You will be contacted with the lab results as soon as they are available. The fastest way to get your results is to activate your My Chart account. Instructions are located on the last page of this paperwork. If you have not heard from [place] regarding the results in 2 weeks, please contact this office.     Migraine Headache A migraine headache is a very strong throbbing pain on one side or both sides of your head. This type of headache can also cause other symptoms. It can last from 4 hours to 3 days. Talk with your doctor about what things may  bring on (trigger) this condition. What are the causes? The exact cause of this condition is not known. This condition may be triggered or caused by:  Drinking alcohol.  Smoking.  Taking medicines, such as: ? Medicine used to treat chest pain (nitroglycerin). ? Birth control pills. ? Estrogen. ? Some blood pressure medicines.  Eating or drinking certain products.  Doing physical activity. Other things that may trigger a migraine headache include:  Having a menstrual period.  Pregnancy.  Hunger.  Stress.  Not getting enough sleep or getting too much sleep.  Weather changes.  Tiredness (fatigue). What increases the risk?  Being 52-109 years old.  Being female.  Having a family history of migraine headaches.  Being Caucasian.  Having depression or anxiety.  Being very overweight. What are the signs or symptoms?  A throbbing pain. This pain may: ? Happen in any area of the head, such as on one side or both sides. ? Make it hard to do daily activities. ? Get worse with physical activity. ? Get worse around bright lights or loud noises.  Other symptoms may include: ? Feeling sick to your stomach (nauseous). ? Vomiting. ? Dizziness. ? Being sensitive to bright lights, loud noises, or smells.  Before you get a migraine headache, you may get warning signs (an aura). An aura may include: ? Seeing flashing lights or having blind spots. ? Seeing bright spots, halos, or zigzag lines. ? Having tunnel vision or blurred vision. ? Having numbness or a tingling feeling. ? Having trouble talking. ? Having weak muscles.  Some people have symptoms after a migraine headache (postdromal phase), such as: ? Tiredness. ? Trouble thinking (concentrating). How is this treated?  Taking medicines that: ? Relieve pain. ? Relieve the feeling of being sick to your  stomach. ? Prevent migraine headaches.  Treatment may also include: ? Having acupuncture. ? Avoiding foods  that bring on migraine headaches. ? Learning ways to control your body functions (biofeedback). ? Therapy to help you know and deal with negative thoughts (cognitive behavioral therapy). Follow these instructions at home: Medicines  Take over-the-counter and prescription medicines only as told by your doctor.  Ask your doctor if the medicine prescribed to you: ? Requires you to avoid driving or using heavy machinery. ? Can cause trouble pooping (constipation). You may need to take these steps to prevent or treat trouble pooping:  Drink enough fluid to keep your pee (urine) pale yellow.  Take over-the-counter or prescription medicines.  Eat foods that are high in fiber. These include beans, whole grains, and fresh fruits and vegetables.  Limit foods that are high in fat and sugar. These include fried or sweet foods. Lifestyle  Do not drink alcohol.  Do not use any products that contain nicotine or tobacco, such as cigarettes, e-cigarettes, and chewing tobacco. If you need help quitting, ask your doctor.  Get at least 8 hours of sleep every night.  Limit and deal with stress. General instructions      Keep a journal to find out what may bring on your migraine headaches. For example, write down: ? What you eat and drink. ? How much sleep you get. ? Any change in what you eat or drink. ? Any change in your medicines.  If you have a migraine headache: ? Avoid things that make your symptoms worse, such as bright lights. ? It may help to lie down in a dark, quiet room. ? Do not drive or use heavy machinery. ? Ask your doctor what activities are safe for you.  Keep all follow-up visits as told by your doctor. This is important. Contact a doctor if:  You get a migraine headache that is different or worse than others you have had.  You have more than 15 headache days in one month. Get help right away if:  Your migraine headache gets very bad.  Your migraine headache lasts  longer than 72 hours.  You have a fever.  You have a stiff neck.  You have trouble seeing.  Your muscles feel weak or like you cannot control them.  You start to lose your balance a lot.  You start to have trouble walking.  You pass out (faint).  You have a seizure. Summary  A migraine headache is a very strong throbbing pain on one side or both sides of your head. These headaches can also cause other symptoms.  This condition may be treated with medicines and changes to your lifestyle.  Keep a journal to find out what may bring on your migraine headaches.  Contact a doctor if you get a migraine headache that is different or worse than others you have had.  Contact your doctor if you have more than 15 headache days in a month. This information is not intended to replace advice given to you by your health care provider. Make sure you discuss any questions you have with your health care provider. Document Released: 08/21/2008 Document Revised: 03/06/2019 Document Reviewed: 12/25/2018 Elsevier Patient Education  2020 [employer]       Agustina Caroli, MD [facility]

## 2019-10-20 NOTE — Patient Instructions (Addendum)
If you have lab work done today you will be contacted with your lab results within the next 2 weeks.  If you have not heard from [place] then please contact us. The fastest way to get your results is to register for My Chart.   IF you received an x-ray today, you will receive an invoice from [facility]. Please contact [facility] at [phone] with questions or concerns regarding your invoice.   IF you received labwork today, you will receive an invoice from [facility]. Please contact [facility] at [phone] with questions or concerns regarding your invoice.   Our billing staff will not be able to assist you with questions regarding bills from these companies.  You will be contacted with the lab results as soon as they are available. The fastest way to get your results is to activate your My Chart account. Instructions are located on the last page of this paperwork. If you have not heard from [place] regarding the results in 2 weeks, please contact this office.     Migraine Headache A migraine headache is a very strong throbbing pain on one side or both sides of your head. This type of headache can also cause other symptoms. It can last from 4 hours to 3 days. Talk with your doctor about what things may bring on (trigger) this condition. What are the causes? The exact cause of this condition is not known. This condition may be triggered or caused by:  Drinking alcohol.  Smoking.  Taking medicines, such as: ? Medicine used to treat chest pain (nitroglycerin). ? Birth control pills. ? Estrogen. ? Some blood pressure medicines.  Eating or drinking certain products.  Doing physical activity. Other things that may trigger a migraine headache include:  Having a menstrual period.  Pregnancy.  Hunger.  Stress.  Not getting enough sleep or getting too much sleep.  Weather changes.  Tiredness (fatigue). What increases the risk?  Being 51-78 years old.  Being  female.  Having a family history of migraine headaches.  Being Caucasian.  Having depression or anxiety.  Being very overweight. What are the signs or symptoms?  A throbbing pain. This pain may: ? Happen in any area of the head, such as on one side or both sides. ? Make it hard to do daily activities. ? Get worse with physical activity. ? Get worse around bright lights or loud noises.  Other symptoms may include: ? Feeling sick to your stomach (nauseous). ? Vomiting. ? Dizziness. ? Being sensitive to bright lights, loud noises, or smells.  Before you get a migraine headache, you may get warning signs (an aura). An aura may include: ? Seeing flashing lights or having blind spots. ? Seeing bright spots, halos, or zigzag lines. ? Having tunnel vision or blurred vision. ? Having numbness or a tingling feeling. ? Having trouble talking. ? Having weak muscles.  Some people have symptoms after a migraine headache (postdromal phase), such as: ? Tiredness. ? Trouble thinking (concentrating). How is this treated?  Taking medicines that: ? Relieve pain. ? Relieve the feeling of being sick to your stomach. ? Prevent migraine headaches.  Treatment may also include: ? Having acupuncture. ? Avoiding foods that bring on migraine headaches. ? Learning ways to control your body functions (biofeedback). ? Therapy to help you know and deal with negative thoughts (cognitive behavioral therapy). Follow these instructions at home: Medicines  Take over-the-counter and prescription medicines only as told by your doctor.  Ask your  doctor if the medicine prescribed to you: ? Requires you to avoid driving or using heavy machinery. ? Can cause trouble pooping (constipation). You may need to take these steps to prevent or treat trouble pooping:  Drink enough fluid to keep your pee (urine) pale yellow.  Take over-the-counter or prescription medicines.  Eat foods that are high in fiber.  These include beans, whole grains, and fresh fruits and vegetables.  Limit foods that are high in fat and sugar. These include fried or sweet foods. Lifestyle  Do not drink alcohol.  Do not use any products that contain nicotine or tobacco, such as cigarettes, e-cigarettes, and chewing tobacco. If you need help quitting, ask your doctor.  Get at least 8 hours of sleep every night.  Limit and deal with stress. General instructions      Keep a journal to find out what may bring on your migraine headaches. For example, write down: ? What you eat and drink. ? How much sleep you get. ? Any change in what you eat or drink. ? Any change in your medicines.  If you have a migraine headache: ? Avoid things that make your symptoms worse, such as bright lights. ? It may help to lie down in a dark, quiet room. ? Do not drive or use heavy machinery. ? Ask your doctor what activities are safe for you.  Keep all follow-up visits as told by your doctor. This is important. Contact a doctor if:  You get a migraine headache that is different or worse than others you have had.  You have more than 15 headache days in one month. Get help right away if:  Your migraine headache gets very bad.  Your migraine headache lasts longer than 72 hours.  You have a fever.  You have a stiff neck.  You have trouble seeing.  Your muscles feel weak or like you cannot control them.  You start to lose your balance a lot.  You start to have trouble walking.  You pass out (faint).  You have a seizure. Summary  A migraine headache is a very strong throbbing pain on one side or both sides of your head. These headaches can also cause other symptoms.  This condition may be treated with medicines and changes to your lifestyle.  Keep a journal to find out what may bring on your migraine headaches.  Contact a doctor if you get a migraine headache that is different or worse than others you have  had.  Contact your doctor if you have more than 15 headache days in a month. This information is not intended to replace advice given to you by your health care provider. Make sure you discuss any questions you have with your health care provider. Document Released: 08/21/2008 Document Revised: 03/06/2019 Document Reviewed: 12/25/2018 Elsevier Patient Education  2020 [employer].

## 2019-10-21 ENCOUNTER — Ambulatory Visit: Payer: 59 | Admitting: Emergency Medicine

## 2019-11-03 NOTE — Telephone Encounter (Signed)
Do not remember.  Please look into this.  Thanks.

## 2020-03-24 ENCOUNTER — Encounter: Payer: Self-pay | Admitting: Emergency Medicine

## 2020-03-29 ENCOUNTER — Other Ambulatory Visit: Payer: Self-pay

## 2020-03-29 ENCOUNTER — Encounter: Payer: Self-pay | Admitting: Emergency Medicine

## 2020-03-29 ENCOUNTER — Ambulatory Visit (INDEPENDENT_AMBULATORY_CARE_PROVIDER_SITE_OTHER): Payer: BC Managed Care – PPO | Admitting: Emergency Medicine

## 2020-03-29 VITALS — BP 141/81 | HR 90 | Temp 98.0°F | Resp 16 | Ht 65.0 in | Wt 223.0 lb

## 2020-03-29 DIAGNOSIS — Z1231 Encounter for screening mammogram for malignant neoplasm of breast: Secondary | ICD-10-CM

## 2020-03-29 DIAGNOSIS — N61 Mastitis without abscess: Secondary | ICD-10-CM | POA: Diagnosis not present

## 2020-03-29 MED ORDER — AZITHROMYCIN 250 MG PO TABS: ORAL_TABLET | ORAL | 0 refills | Status: DC

## 2020-03-29 NOTE — Patient Instructions (Addendum)
   If you have lab work done today you will be contacted with your lab results within the next 2 weeks.  If you have not heard from us then please contact us. The fastest way to get your results is to register for My Chart.   IF you received an x-ray today, you will receive an invoice from [facility]. Please contact [facility] at [phone] with questions or concerns regarding your invoice.   IF you received labwork today, you will receive an invoice from [facility]. Please contact [facility] at [phone] with questions or concerns regarding your invoice.   Our billing staff will not be able to assist you with questions regarding bills from these companies.  You will be contacted with the lab results as soon as they are available. The fastest way to get your results is to activate your My Chart account. Instructions are located on the last page of this paperwork. If you have not heard from us regarding the results in 2 weeks, please contact this office.     Cellulitis, Adult  Cellulitis is a skin infection. The infected area is often warm, red, swollen, and sore. It occurs most often in the arms and lower legs. It is very important to get treated for this condition. What are the causes? This condition is caused by bacteria. The bacteria enter through a break in the skin, such as a cut, burn, insect bite, open sore, or crack. What increases the risk? This condition is more likely to occur in people who:  Have a weak body defense system (immune system).  Have open cuts, burns, bites, or scrapes on the skin.  Are older than 48 years of age.  Have a blood sugar problem (diabetes).  Have a long-lasting (chronic) liver disease (cirrhosis) or kidney disease.  Are very overweight (obese).  Have a skin problem, such as: ? Itchy rash (eczema). ? Slow movement of blood in the veins (venous stasis). ? Fluid buildup below the skin (edema).  Have been treated with  high-energy rays (radiation).  Use IV drugs. What are the signs or symptoms? Symptoms of this condition include:  Skin that is: ? Red. ? Streaking. ? Spotting. ? Swollen. ? Sore or painful when you touch it. ? Warm.  A fever.  Chills.  Blisters. How is this diagnosed? This condition is diagnosed based on:  Medical history.  Physical exam.  Blood tests.  Imaging tests. How is this treated? Treatment for this condition may include:  Medicines to treat infections or allergies.  Home care, such as: ? Rest. ? Placing cold or warm cloths (compresses) on the skin.  Hospital care, if the condition is very bad. Follow these instructions at home: Medicines  Take over-the-counter and prescription medicines only as told by your doctor.  If you were prescribed an antibiotic medicine, take it as told by your doctor. Do not stop taking it even if you start to feel better. General instructions   Drink enough fluid to keep your pee (urine) pale yellow.  Do not touch or rub the infected area.  Raise (elevate) the infected area above the level of your heart while you are sitting or lying down.  Place cold or warm cloths on the area as told by your doctor.  Keep all follow-up visits as told by your doctor. This is important. Contact a doctor if:  You have a fever.  You do not start to get better after 1-2 days of treatment.  Your bone   bone or joint under the infected area starts to hurt after the skin has healed.  Your infection comes back. This can happen in the same area or another area.  You have a swollen bump in the area.  You have new symptoms.  You feel ill and have muscle aches and pains. Get help right away if:  Your symptoms get worse.  You feel very sleepy.  You throw up (vomit) or have watery poop (diarrhea) for a long time.  You see red streaks coming from the area.  Your red area gets larger.  Your red area turns dark in color. These symptoms  may represent a serious problem that is an emergency. Do not wait to see if the symptoms will go away. Get medical help right away. Call your local emergency services (911 in the [place] Do not drive yourself to the hospital. Summary  Cellulitis is a skin infection. The area is often warm, red, swollen, and sore.  This condition is treated with medicines, rest, and cold and warm cloths.  Take all medicines only as told by your doctor.  Tell your doctor if symptoms do not start to get better after 1-2 days of treatment. This information is not intended to replace advice given to you by your health care provider. Make sure you discuss any questions you have with your health care provider. Document Revised: 04/03/2018 Document Reviewed: 04/03/2018 Elsevier Patient Education  2020 Elsevier Inc.  

## 2020-03-29 NOTE — Progress Notes (Signed)
Amy Charles 49 y.o.   Chief Complaint  Patient presents with  . Breast Problem    per pt patch (redness) under RIGHT breast with pain    HISTORY OF PRESENT ILLNESS: This is a 49 y.o. female complaining of redness on the right breast with mild pain for several days. No other complaints or medical concerns today.  HPI   Prior to Admission medications   Medication Sig Start Date End Date Taking? Authorizing Provider  ALPRAZolam Duanne Moron) 1 MG tablet Take 0.5 tablets (0.5 mg total) by mouth daily as needed for anxiety. 10/15/19  Yes Sephora Boyar, Ines Bloomer, MD  eletriptan (RELPAX) 20 MG tablet Take 1 tablet (20 mg total) by mouth as needed for migraine or headache. May repeat in 2 hours if headache persists or recurs. 10/15/19  Yes Zyhir Cappella, Ines Bloomer, MD  levocetirizine (XYZAL) 5 MG tablet Take 1 tablet (5 mg total) by mouth every evening. 06/19/18  Yes Jaynee Eagles, PA-C  traMADol (ULTRAM) 50 MG tablet Take 1 tablet (50 mg total) by mouth every 8 (eight) hours as needed for severe pain. 10/15/19  Yes Horald Pollen, MD  azithromycin [facility]) 250 MG tablet Sig as indicated 03/29/20   Horald Pollen, MD  escitalopram (LEXAPRO) 10 MG tablet Take 1 tablet (10 mg total) by mouth daily. 07/18/18 10/16/18  Horald Pollen, MD  lisinopril (ZESTRIL) 10 MG tablet TAKE 1 TABLET BY MOUTH EVERY DAY Patient not taking: Reported on 03/29/2020 07/24/19   Horald Pollen, MD  varenicline (CHANTIX STARTING MONTH PAK) 0.5 MG X 11 & 1 MG X 42 tablet Take one 0.5 mg tablet by mouth once daily for 3 days, then increase to one 0.5 mg tablet twice daily for 4 days, then increase to one 1 mg tablet twice daily. Patient not taking: Reported on 03/29/2020 10/15/19   Horald Pollen, MD    Allergies  Allergen Reactions  . Augmentin [Amoxicillin-Pot Clavulanate] Nausea And Vomiting  . Sumatriptan     convulsions  . Tetracyclines & Related     Patient Active Problem List   Diagnosis  Date Noted  . Chronic diarrhea 07/18/2018  . Situational anxiety 07/18/2018  . Essential hypertension 05/14/2018  . Generalized anxiety disorder 05/14/2018  . Chronic daily headache 05/18/2012    Past Medical History:  Diagnosis Date  . Allergy   . Anxiety   . Cancer ([place])   . Depression   . Gallstones   . GERD (gastroesophageal reflux disease)   . IBS (irritable bowel syndrome)   . Migraines     Past Surgical History:  Procedure Laterality Date  . BREAST BIOPSY    . CHOLECYSTECTOMY N/A 12/25/2017   Procedure: LAPAROSCOPIC CHOLECYSTECTOMY;  Surgeon: Olean Ree, MD;  Location: [facility] ORS;  Service: General;  Laterality: N/A;  . NOVASURE ABLATION    . TUBAL LIGATION      Social History   Socioeconomic History  . Marital status: Married    Spouse name: Not on file  . Number of children: Not on file  . Years of education: Not on file  . Highest education level: Not on file  Occupational History  . Not on file  Tobacco Use  . Smoking status: Current Every Day Smoker    Packs/day: 0.50    Years: 30.00    Pack years: 15.00    Types: Cigarettes  . Smokeless tobacco: Never Used  . Tobacco comment: off/on  Substance and Sexual Activity  . Alcohol use: No  .  Drug use: No  . Sexual activity: Not on file  Other Topics Concern  . Not on file  Social History Narrative  . Not on file   Social Determinants of Health   Financial Resource Strain:   . Difficulty of Paying Living Expenses:   Food Insecurity:   . Worried About Charity fundraiser in the Last Year:   . Arboriculturist in the Last Year:   Transportation Needs:   . Film/video editor (Medical):   Marland Kitchen Lack of Transportation (Non-Medical):   Physical Activity:   . Days of Exercise per Week:   . Minutes of Exercise per Session:   Stress:   . Feeling of Stress :   Social Connections:   . Frequency of Communication with Friends and Family:   . Frequency of Social Gatherings with Friends and Family:   .  Attends Religious Services:   . Active Member of Clubs or Organizations:   . Attends Archivist Meetings:   Marland Kitchen Marital Status:   Intimate Partner Violence:   . Fear of Current or Ex-Partner:   . Emotionally Abused:   Marland Kitchen Physically Abused:   . Sexually Abused:     Family History  Problem Relation Age of Onset  . Diabetes Mother   . Heart disease Mother   . Hyperlipidemia Mother   . Hypertension Mother   . Heart disease Father   . Hyperlipidemia Father   . Hypertension Father   . Cancer Father   . Hyperlipidemia Brother   . Hypertension Brother   . Hyperlipidemia Brother   . Hypertension Brother      Review of Systems  Constitutional: Negative.  Negative for chills and fever.  HENT: Negative.  Negative for congestion and sore throat.   Respiratory: Negative.  Negative for cough and shortness of breath.   Cardiovascular: Negative.  Negative for chest pain and palpitations.  Gastrointestinal: Negative.  Negative for abdominal pain, nausea and vomiting.  Genitourinary: Negative.  Negative for dysuria and hematuria.  Musculoskeletal: Negative.   Skin: Positive for rash.  Neurological: Negative.  Negative for dizziness and headaches.  All other systems reviewed and are negative.  Today's Vitals   03/29/20 1639  BP: (!) 141/81  Pulse: 90  Resp: 16  Temp: 98 F (36.7 C)  TempSrc: Temporal  SpO2: 95%  Weight: 223 lb (101.2 kg)  Height: 5\' 5"  (1.651 m)   Body mass index is 37.11 kg/m.   Physical Exam Vitals reviewed.  Constitutional:      Appearance: Normal appearance.  HENT:     Head: Normocephalic.  Eyes:     Extraocular Movements: Extraocular movements intact.     Pupils: Pupils are equal, round, and reactive to light.  Cardiovascular:     Rate and Rhythm: Normal rate.  Pulmonary:     Effort: Pulmonary effort is normal.  Musculoskeletal:        General: Normal range of motion.     Cervical back: Normal range of motion.  Skin:    General: Skin  is warm and dry.     Capillary Refill: Capillary refill takes less than 2 seconds.     Comments: Right breast: Small area of cellulitis to area under the nipple.  No fluctuation.  No masses felt.  Neurological:     General: No focal deficit present.     Mental Status: She is alert and oriented to person, place, and time.  Psychiatric:  Mood and Affect: Mood normal.        Behavior: Behavior normal.      ASSESSMENT & PLAN: Yamini was seen today for breast problem.  Diagnoses and all orders for this visit:  Cellulitis of right breast -     azithromycin (ZITHROMAX) 250 MG tablet; Sig as indicated  Encounter for screening mammogram for malignant neoplasm of breast -     MM Digital Screening; Future    Patient Instructions       If you have lab work done today you will be contacted with your lab results within the next 2 weeks.  If you have not heard from [place] then please contact us. The fastest way to get your results is to register for My Chart.   IF you received an x-ray today, you will receive an invoice from [facility]. Please contact [facility] at [phone] with questions or concerns regarding your invoice.   IF you received labwork today, you will receive an invoice from [facility]. Please contact [facility] at [phone] with questions or concerns regarding your invoice.   Our billing staff will not be able to assist you with questions regarding bills from these companies.  You will be contacted with the lab results as soon as they are available. The fastest way to get your results is to activate your My Chart account. Instructions are located on the last page of this paperwork. If you have not heard from [place] regarding the results in 2 weeks, please contact this office.     Cellulitis, Adult  Cellulitis is a skin infection. The infected area is often warm, red, swollen, and sore. It occurs most often in the arms and lower legs. It is very  important to get treated for this condition. What are the causes? This condition is caused by bacteria. The bacteria enter through a break in the skin, such as a cut, burn, insect bite, open sore, or crack. What increases the risk? This condition is more likely to occur in people who:  Have a weak body defense system (immune system).  Have open cuts, burns, bites, or scrapes on the skin.  Are older than 48 years of age.  Have a blood sugar problem (diabetes).  Have a long-lasting (chronic) liver disease (cirrhosis) or kidney disease.  Are very overweight (obese).  Have a skin problem, such as: ? Itchy rash (eczema). ? Slow movement of blood in the veins (venous stasis). ? Fluid buildup below the skin (edema).  Have been treated with high-energy rays (radiation).  Use IV drugs. What are the signs or symptoms? Symptoms of this condition include:  Skin that is: ? Red. ? Streaking. ? Spotting. ? Swollen. ? Sore or painful when you touch it. ? Warm.  A fever.  Chills.  Blisters. How is this diagnosed? This condition is diagnosed based on:  Medical history.  Physical exam.  Blood tests.  Imaging tests. How is this treated? Treatment for this condition may include:  Medicines to treat infections or allergies.  Home care, such as: ? Rest. ? Placing cold or warm cloths (compresses) on the skin.  Hospital care, if the condition is very bad. Follow these instructions at home: Medicines  Take over-the-counter and prescription medicines only as told by your doctor.  If you were prescribed an antibiotic medicine, take it as told by your doctor. Do not stop taking it even if you start to feel better. General instructions   Drink enough fluid to keep your pee (  urine) pale yellow.  Do not touch or rub the infected area.  Raise (elevate) the infected area above the level of your heart while you are sitting or lying down.  Place cold or warm cloths on the  area as told by your doctor.  Keep all follow-up visits as told by your doctor. This is important. Contact a doctor if:  You have a fever.  You do not start to get better after 1-2 days of treatment.  Your bone or joint under the infected area starts to hurt after the skin has healed.  Your infection comes back. This can happen in the same area or another area.  You have a swollen bump in the area.  You have new symptoms.  You feel ill and have muscle aches and pains. Get help right away if:  Your symptoms get worse.  You feel very sleepy.  You throw up (vomit) or have watery poop (diarrhea) for a long time.  You see red streaks coming from the area.  Your red area gets larger.  Your red area turns dark in color. These symptoms may represent a serious problem that is an emergency. Do not wait to see if the symptoms will go away. Get medical help right away. Call your local emergency services (911 in the [place] Do not drive yourself to the hospital. Summary  Cellulitis is a skin infection. The area is often warm, red, swollen, and sore.  This condition is treated with medicines, rest, and cold and warm cloths.  Take all medicines only as told by your doctor.  Tell your doctor if symptoms do not start to get better after 1-2 days of treatment. This information is not intended to replace advice given to you by your health care provider. Make sure you discuss any questions you have with your health care provider. Document Revised: 04/03/2018 Document Reviewed: 04/03/2018 Elsevier Patient Education  2020 Elsevier Inc.      Agustina Caroli, MD [facility]

## 2020-04-13 ENCOUNTER — Ambulatory Visit: Payer: 59

## 2020-04-14 ENCOUNTER — Ambulatory Visit: Payer: 59

## 2020-04-20 ENCOUNTER — Ambulatory Visit: Payer: 59

## 2020-05-11 ENCOUNTER — Ambulatory Visit: Payer: 59

## 2020-05-18 ENCOUNTER — Other Ambulatory Visit: Payer: Self-pay

## 2020-05-18 ENCOUNTER — Ambulatory Visit
Admission: RE | Admit: 2020-05-18 | Discharge: 2020-05-18 | Disposition: A | Discharge status: Home / Self Care | Payer: BC Managed Care – PPO | Source: Ambulatory Visit | Attending: Emergency Medicine | Admitting: Emergency Medicine

## 2020-05-18 DIAGNOSIS — Z1231 Encounter for screening mammogram for malignant neoplasm of breast: Secondary | ICD-10-CM

## 2020-06-02 ENCOUNTER — Emergency Department: Payer: BC Managed Care – PPO

## 2020-06-02 ENCOUNTER — Other Ambulatory Visit: Payer: Self-pay

## 2020-06-02 ENCOUNTER — Encounter: Payer: Self-pay | Admitting: Emergency Medicine

## 2020-06-02 ENCOUNTER — Emergency Department
Admission: EM | Admit: 2020-06-02 | Discharge: 2020-06-02 | Disposition: A | Discharge status: Home / Self Care | Payer: BC Managed Care – PPO | Attending: Emergency Medicine | Admitting: Emergency Medicine

## 2020-06-02 DIAGNOSIS — Y929 Unspecified place or not applicable: Secondary | ICD-10-CM | POA: Diagnosis not present

## 2020-06-02 DIAGNOSIS — Y939 Activity, unspecified: Secondary | ICD-10-CM | POA: Diagnosis not present

## 2020-06-02 DIAGNOSIS — M25462 Effusion, left knee: Secondary | ICD-10-CM | POA: Diagnosis not present

## 2020-06-02 DIAGNOSIS — I10 Essential (primary) hypertension: Secondary | ICD-10-CM | POA: Diagnosis not present

## 2020-06-02 DIAGNOSIS — F17210 Nicotine dependence, cigarettes, uncomplicated: Secondary | ICD-10-CM | POA: Insufficient documentation

## 2020-06-02 DIAGNOSIS — Z79899 Other long term (current) drug therapy: Secondary | ICD-10-CM | POA: Insufficient documentation

## 2020-06-02 DIAGNOSIS — X509XXA Other and unspecified overexertion or strenuous movements or postures, initial encounter: Secondary | ICD-10-CM | POA: Insufficient documentation

## 2020-06-02 DIAGNOSIS — Y999 Unspecified external cause status: Secondary | ICD-10-CM | POA: Insufficient documentation

## 2020-06-02 DIAGNOSIS — S8992XA Unspecified injury of left lower leg, initial encounter: Secondary | ICD-10-CM

## 2020-06-02 MED ORDER — KETOROLAC TROMETHAMINE 30 MG/ML IJ SOLN
30.0000 mg | Freq: Once | INTRAMUSCULAR | Status: AC
  Administered 2020-06-02: 30 mg via INTRAMUSCULAR
  Filled 2020-06-02: qty 1

## 2020-06-02 MED ORDER — TRAMADOL HCL 50 MG PO TABS: 50.0000 mg | ORAL_TABLET | Freq: Four times a day (QID) | ORAL | 0 refills | Status: DC | PRN

## 2020-06-02 NOTE — ED Notes (Signed)
Patient does not want Vitals completed before discharge.

## 2020-06-02 NOTE — ED Provider Notes (Signed)
[facility] Emergency Department Provider Note  ____________________________________________  Time seen: Approximately 5:38 PM  I have reviewed the triage vital signs and the nursing notes.   HISTORY  Chief Complaint Knee Injury    HPI Amy Charles is a 50 y.o. female that presents to the emergency department for evaluation of left knee pain after injury yesterday.  Patient was mowing when the mower kicked and reversed and her left foot stayed planted.  Her leg stretched out from under her.  She began to have left knee pain right after.  She hoped that it would improve overnight but pain worsened.  She is having pain over top the front of her knee, the side of her knee and to the back of her knee.  She has some swelling all around her knee.  She has been walking.  No additional injuries.  Past Medical History:  Diagnosis Date  . Allergy   . Anxiety   . Cancer ([place])   . Depression   . Gallstones   . GERD (gastroesophageal reflux disease)   . IBS (irritable bowel syndrome)   . Migraines     Patient Active Problem List   Diagnosis Date Noted  . Chronic diarrhea 07/18/2018  . Situational anxiety 07/18/2018  . Essential hypertension 05/14/2018  . Generalized anxiety disorder 05/14/2018  . Chronic daily headache 05/18/2012    Past Surgical History:  Procedure Laterality Date  . BREAST BIOPSY    . CHOLECYSTECTOMY N/A 12/25/2017   Procedure: LAPAROSCOPIC CHOLECYSTECTOMY;  Surgeon: Olean Ree, MD;  Location: [facility] ORS;  Service: General;  Laterality: N/A;  . NOVASURE ABLATION    . TUBAL LIGATION      Prior to Admission medications   Medication Sig Start Date End Date Taking? Authorizing Provider  ALPRAZolam Duanne Moron) 1 MG tablet Take 0.5 tablets (0.5 mg total) by mouth daily as needed for anxiety. 10/15/19   Horald Pollen, MD  azithromycin [facility]) 250 MG tablet Sig as indicated 03/29/20   Horald Pollen, MD  eletriptan (RELPAX) 20 MG  tablet Take 1 tablet (20 mg total) by mouth as needed for migraine or headache. May repeat in 2 hours if headache persists or recurs. 10/15/19   Horald Pollen, MD  escitalopram (LEXAPRO) 10 MG tablet Take 1 tablet (10 mg total) by mouth daily. 07/18/18 10/16/18  Horald Pollen, MD  levocetirizine (XYZAL) 5 MG tablet Take 1 tablet (5 mg total) by mouth every evening. 06/19/18   Jaynee Eagles, PA-C  lisinopril (ZESTRIL) 10 MG tablet TAKE 1 TABLET BY MOUTH EVERY DAY Patient not taking: Reported on 03/29/2020 07/24/19   Horald Pollen, MD  traMADol (ULTRAM) 50 MG tablet Take 1 tablet (50 mg total) by mouth every 6 (six) hours as needed. 06/02/20 06/02/21  Laban Emperor, PA-C  varenicline (CHANTIX STARTING MONTH PAK) 0.5 MG X 11 & 1 MG X 42 tablet Take one 0.5 mg tablet by mouth once daily for 3 days, then increase to one 0.5 mg tablet twice daily for 4 days, then increase to one 1 mg tablet twice daily. Patient not taking: Reported on 03/29/2020 10/15/19   Horald Pollen, MD    Allergies Augmentin [amoxicillin-pot clavulanate], Sumatriptan, and Tetracyclines & related  Family History  Problem Relation Age of Onset  . Diabetes Mother   . Heart disease Mother   . Hyperlipidemia Mother   . Hypertension Mother   . Heart disease Father   . Hyperlipidemia Father   . Hypertension  Father   . Cancer Father   . Hyperlipidemia Brother   . Hypertension Brother   . Hyperlipidemia Brother   . Hypertension Brother     Social History Social History   Tobacco Use  . Smoking status: Current Every Day Smoker    Packs/day: 0.50    Years: 30.00    Pack years: 15.00    Types: Cigarettes  . Smokeless tobacco: Never Used  . Tobacco comment: off/on  Substance Use Topics  . Alcohol use: No  . Drug use: No     Review of Systems  Cardiovascular: No chest pain. Respiratory: No cough. No SOB. Gastrointestinal: No abdominal pain.  No nausea, no vomiting.  Musculoskeletal: Positive for  knee pain. Skin: Negative for rash, abrasions, lacerations, ecchymosis. Neurological: Negative for headaches, numbness or tingling   ____________________________________________   PHYSICAL EXAM:  VITAL SIGNS: ED Triage Vitals  Enc Vitals Group     BP 06/02/20 1501 (!) 149/83     Pulse Rate 06/02/20 1501 (!) 55     Resp 06/02/20 1501 16     Temp 06/02/20 1501 97.8 F (36.6 C)     Temp Source 06/02/20 1501 Oral     SpO2 06/02/20 1501 96 %     Weight 06/02/20 1459 223 lb 1.7 oz (101.2 kg)     Height 06/02/20 1459 5\' 5"  (1.651 m)     Head Circumference --      Peak Flow --      Pain Score 06/02/20 1458 7     Pain Loc --      Pain Edu? --      Excl. in [place]? --      Constitutional: Alert and oriented. Well appearing and in no acute distress. Eyes: Conjunctivae are normal. PERRL. EOMI. Head: Atraumatic. ENT:      Ears:      Nose: No congestion/rhinnorhea.      Mouth/Throat: Mucous membranes are moist.  Neck: No stridor. Cardiovascular: Normal rate, regular rhythm.  Good peripheral circulation. Respiratory: Normal respiratory effort without tachypnea or retractions. Lungs CTAB. Good air entry to the bases with no decreased or absent breath sounds. Musculoskeletal: Full range of motion to all extremities. No gross deformities appreciated.  Able to flex and extend actively at the right knee.  Tenderness to palpation to left popliteal fossa.  Tenderness to palpation just above left patella.  No erythema. Neurologic:  Normal speech and language. No gross focal neurologic deficits are appreciated.  Skin:  Skin is warm, dry and intact. No rash noted. Psychiatric: Mood and affect are normal. Speech and behavior are normal. Patient exhibits appropriate insight and judgement.   ____________________________________________   LABS (all labs ordered are listed, but only abnormal results are displayed)  Labs Reviewed - No data to  display ____________________________________________  EKG   ____________________________________________  RADIOLOGY Robinette Haines, personally viewed and evaluated these images (plain radiographs) as part of my medical decision making, as well as reviewing the written report by the radiologist.  DG Knee Complete 4 Views Left  Result Date: 06/02/2020 CLINICAL DATA:  Pain after twisting injury EXAM: LEFT KNEE - COMPLETE 4+ VIEW COMPARISON:  None. FINDINGS: Standing frontal, standing tunnel, standing lateral, and sunrise patellar images were obtained. There is no fracture or dislocation. There is a small joint effusion. There is minimal narrowing medially. Other joint spaces appear normal. No erosive change. IMPRESSION: Small joint effusion. No fracture or dislocation. Minimal narrowing medially. Other joint spaces appear unremarkable. Electronically Signed  By: Lowella Grip III M.D.   On: 06/02/2020 15:41    ____________________________________________    PROCEDURES  Procedure(s) performed:    Procedures    Medications  ketorolac (TORADOL) 30 MG/ML injection 30 mg (30 mg Intramuscular Given 06/02/20 1810)     ____________________________________________   INITIAL IMPRESSION / ASSESSMENT AND PLAN / ED COURSE  Pertinent labs & imaging results that were available during my care of the patient were reviewed by me and considered in my medical decision making (see chart for details).  Review of the [place] CSRS was performed in accordance of the [place] prior to dispensing any controlled drugs.   Patient presented to the emergency department for evaluation of knee injury yesterday.  Vital signs and exam are reassuring.  X-ray negative for acute bony abnormalities and shows a small joint effusion.  Knee immobilizer was placed.  Crutches were given.  Patient will be discharged home with prescriptions for tramadol. Patient is to follow up with orthopedics as directed.  Referral was  given to Dr. Posey Pronto.  Patient is given ED precautions to return to the ED for any worsening or new symptoms.  Amy Charles was evaluated in Emergency Department on 06/02/2020 for the symptoms described in the history of present illness. She was evaluated in the context of the global COVID-19 pandemic, which necessitated consideration that the patient might be at risk for infection with the SARS-CoV-2 virus that causes COVID-19. Institutional protocols and algorithms that pertain to the evaluation of patients at risk for COVID-19 are in a state of rapid change based on information released by regulatory bodies including the CDC and federal and state organizations. These policies and algorithms were followed during the patient's care in the ED.   ____________________________________________  FINAL CLINICAL IMPRESSION(S) / ED DIAGNOSES  Final diagnoses:  Injury of left knee, initial encounter      NEW MEDICATIONS STARTED DURING THIS VISIT:  ED Discharge Orders         Ordered    traMADol (ULTRAM) 50 MG tablet  Every 6 hours PRN     Discontinue  Reprint     06/02/20 1805              This chart was dictated using voice recognition software/Dragon. Despite best efforts to proofread, errors can occur which can change the meaning. Any change was purely unintentional.    Laban Emperor, PA-C 06/02/20 2258    Vanessa Roberts, MD 06/03/20 [phone]

## 2020-06-02 NOTE — Discharge Instructions (Addendum)
There is an effusion on your knee x-ray but no signs of a fracture.  I suspect that you have injured the tendons and ligaments around your knee.  Please wear knee immobilizer.  Ice and elevate knee tonight.  Take tramadol for extreme pain.  Please call orthopedics tomorrow for a follow-up appointment next week for recheck.

## 2020-06-02 NOTE — ED Triage Notes (Signed)
C/O left knee pain.  States injured knee while mowing the lawn yesterday.  States pain radiates up toward left groin.  AAOx3.  Skin warm and dry. NAD

## 2020-07-07 ENCOUNTER — Telehealth (INDEPENDENT_AMBULATORY_CARE_PROVIDER_SITE_OTHER): Payer: BC Managed Care – PPO | Admitting: Emergency Medicine

## 2020-07-07 ENCOUNTER — Encounter: Payer: Self-pay | Admitting: Emergency Medicine

## 2020-07-07 ENCOUNTER — Other Ambulatory Visit: Payer: Self-pay

## 2020-07-07 VITALS — BP 186/109 | Ht 64.0 in | Wt 185.0 lb

## 2020-07-07 DIAGNOSIS — F411 Generalized anxiety disorder: Secondary | ICD-10-CM

## 2020-07-07 DIAGNOSIS — F418 Other specified anxiety disorders: Secondary | ICD-10-CM | POA: Diagnosis not present

## 2020-07-07 DIAGNOSIS — G43719 Chronic migraine without aura, intractable, without status migrainosus: Secondary | ICD-10-CM

## 2020-07-07 DIAGNOSIS — I10 Essential (primary) hypertension: Secondary | ICD-10-CM

## 2020-07-07 MED ORDER — ELETRIPTAN HYDROBROMIDE 20 MG PO TABS: 20.0000 mg | ORAL_TABLET | ORAL | 2 refills | Status: DC | PRN

## 2020-07-07 MED ORDER — LISINOPRIL 10 MG PO TABS: 10.0000 mg | ORAL_TABLET | Freq: Every day | ORAL | 3 refills | Status: DC

## 2020-07-07 MED ORDER — ESCITALOPRAM OXALATE 10 MG PO TABS: 10.0000 mg | ORAL_TABLET | Freq: Every day | ORAL | 1 refills | Status: DC

## 2020-07-07 MED ORDER — ALPRAZOLAM 1 MG PO TABS: 0.5000 mg | ORAL_TABLET | Freq: Every day | ORAL | 1 refills | Status: DC | PRN

## 2020-07-07 NOTE — Progress Notes (Addendum)
Telemedicine Encounter- SOAP NOTE Established Patient MyChart video conference This video telephone encounter was conducted with the patient's (or proxy's) verbal consent via video audio telecommunications: yes/no: Yes Patient was instructed to have this encounter in a suitably private space; and to only have persons present to whom they give permission to participate. In addition, patient identity was confirmed by use of name plus two identifiers (DOB and address).  I discussed the limitations, risks, security and privacy concerns of performing an evaluation and management service by telephone and the availability of in person appointments. I also discussed with the patient that there may be a patient responsible charge related to this service. The patient expressed understanding and agreed to proceed.  I spent a total of TIME; 0 MIN TO 60 MIN: 20 minutes talking with the patient or their proxy.  Chief Complaint  Patient presents with  . Migraine    for 2 weeks with diarrhea, nausea and vomiting, per patient  . Hypertension    per patient the doctor told her to stop blood pressure medication if it cause low readings she is out of medication for 2 months - blood pressure recently was 186/109  . Medication Refill    Lisinopril, Lexapro and Xanax    Subjective   Amy Charles is a 49 y.o. female established patient. Telephone visit today complaining of typical migraine headache for the past 2 weeks waxing and waning with occasional nausea and vomiting.  Denies any other associated symptoms. Denies flulike symptoms, fever chills, any rashes, chest pain, difficulty breathing. Out of her migraine medication, Relpax. Blood pressure has been high.  Out of her lisinopril medication. Needs refill on Lexapro and alprazolam. No other complaints or medical concerns today.  HPI   Patient Active Problem List   Diagnosis Date Noted  . Chronic diarrhea 07/18/2018  . Situational anxiety  07/18/2018  . Essential hypertension 05/14/2018  . Generalized anxiety disorder 05/14/2018  . Chronic daily headache 05/18/2012    Past Medical History:  Diagnosis Date  . Allergy   . Anxiety   . Cancer ([place])   . Depression   . Gallstones   . GERD (gastroesophageal reflux disease)   . IBS (irritable bowel syndrome)   . Migraines     Current Outpatient Medications  Medication Sig Dispense Refill  . ALPRAZolam (XANAX) 1 MG tablet Take 0.5 tablets (0.5 mg total) by mouth daily as needed for anxiety. 30 tablet 1  . azithromycin (ZITHROMAX) 250 MG tablet Sig as indicated 6 tablet 0  . eletriptan (RELPAX) 20 MG tablet Take 1 tablet (20 mg total) by mouth as needed for migraine or headache. May repeat in 2 hours if headache persists or recurs. 10 tablet 2  . levocetirizine (XYZAL) 5 MG tablet Take 1 tablet (5 mg total) by mouth every evening. 90 tablet 3  . lisinopril (ZESTRIL) 10 MG tablet Take 1 tablet (10 mg total) by mouth daily. 90 tablet 3  . traMADol (ULTRAM) 50 MG tablet Take 1 tablet (50 mg total) by mouth every 6 (six) hours as needed. 8 tablet 0  . varenicline (CHANTIX STARTING MONTH PAK) 0.5 MG X 11 & 1 MG X 42 tablet Take one 0.5 mg tablet by mouth once daily for 3 days, then increase to one 0.5 mg tablet twice daily for 4 days, then increase to one 1 mg tablet twice daily. 53 tablet 0  . escitalopram (LEXAPRO) 10 MG tablet Take 1 tablet (10 mg total) by mouth daily.  90 tablet 1   No current facility-administered medications for this visit.    Allergies  Allergen Reactions  . Augmentin [Amoxicillin-Pot Clavulanate] Nausea And Vomiting  . Sumatriptan     convulsions  . Tetracyclines & Related     Social History   Socioeconomic History  . Marital status: Married    Spouse name: Not on file  . Number of children: Not on file  . Years of education: Not on file  . Highest education level: Not on file  Occupational History  . Not on file  Tobacco Use  . Smoking  status: Current Every Day Smoker    Packs/day: 0.50    Years: 30.00    Pack years: 15.00    Types: Cigarettes  . Smokeless tobacco: Never Used  . Tobacco comment: off/on  Substance and Sexual Activity  . Alcohol use: No  . Drug use: No  . Sexual activity: Not on file  Other Topics Concern  . Not on file  Social History Narrative  . Not on file   Social Determinants of Health   Financial Resource Strain:   . Difficulty of Paying Living Expenses:   Food Insecurity:   . Worried About Charity fundraiser in the Last Year:   . Arboriculturist in the Last Year:   Transportation Needs:   . Film/video editor (Medical):   Marland Kitchen Lack of Transportation (Non-Medical):   Physical Activity:   . Days of Exercise per Week:   . Minutes of Exercise per Session:   Stress:   . Feeling of Stress :   Social Connections:   . Frequency of Communication with Friends and Family:   . Frequency of Social Gatherings with Friends and Family:   . Attends Religious Services:   . Active Member of Clubs or Organizations:   . Attends Archivist Meetings:   Marland Kitchen Marital Status:   Intimate Partner Violence:   . Fear of Current or Ex-Partner:   . Emotionally Abused:   Marland Kitchen Physically Abused:   . Sexually Abused:     Review of Systems  Constitutional: Negative.  Negative for chills and fever.  HENT: Negative.  Negative for congestion and sore throat.   Respiratory: Negative.  Negative for cough and shortness of breath.   Cardiovascular: Negative.  Negative for chest pain and palpitations.  Gastrointestinal: Positive for nausea. Negative for abdominal pain, blood in stool, diarrhea, melena and vomiting.  Genitourinary: Negative.  Negative for dysuria and hematuria.  Musculoskeletal: Negative.  Negative for back pain, myalgias and neck pain.  Skin: Negative.  Negative for rash.  Neurological: Positive for headaches.  All other systems reviewed and are negative.   Objective  Alert and oriented  x3 in no apparent respiratory distress Vitals as reported by the patient: Today's Vitals   07/07/20 1211  BP: (!) 186/109  Weight: 185 lb (83.9 kg)  Height: 5\' 4"  (1.626 m)    Amy Charles was seen today for migraine, hypertension and medication refill.  Diagnoses and all orders for this visit:  Intractable chronic migraine without aura and without status migrainosus -     eletriptan (RELPAX) 20 MG tablet; Take 1 tablet (20 mg total) by mouth as needed for migraine or headache. May repeat in 2 hours if headache persists or recurs.  Situational anxiety -     ALPRAZolam (XANAX) 1 MG tablet; Take 0.5 tablets (0.5 mg total) by mouth daily as needed for anxiety.  Essential hypertension -  lisinopril (ZESTRIL) 10 MG tablet; Take 1 tablet (10 mg total) by mouth daily.  Generalized anxiety disorder -     escitalopram (LEXAPRO) 10 MG tablet; Take 1 tablet (10 mg total) by mouth daily.   Patient: Home  Provider: Office      I discussed the assessment and treatment plan with the patient. The patient was provided an opportunity to ask questions and all were answered. The patient agreed with the plan and demonstrated an understanding of the instructions.   The patient was advised to call back or seek an in-person evaluation if the symptoms worsen or if the condition fails to improve as anticipated.  I provided 20 minutes of non-face-to-face time during this encounter.  Horald Pollen, MD  Primary [facility] at [facility]

## 2020-07-07 NOTE — Patient Instructions (Signed)
° ° ° °  If you have lab work done today you will be contacted with your lab results within the next 2 weeks.  If you have not heard from us then please contact us. The fastest way to get your results is to register for My Chart. ° ° °IF you received an x-ray today, you will receive an invoice from [facility]. Please contact [facility] at [phone] with questions or concerns regarding your invoice.  ° °IF you received labwork today, you will receive an invoice from [facility]. Please contact [facility] at [phone] with questions or concerns regarding your invoice.  ° °Our billing staff will not be able to assist you with questions regarding bills from these companies. ° °You will be contacted with the lab results as soon as they are available. The fastest way to get your results is to activate your My Chart account. Instructions are located on the last page of this paperwork. If you have not heard from us regarding the results in 2 weeks, please contact this office. °  ° ° ° °

## 2020-07-20 ENCOUNTER — Telehealth: Payer: Self-pay | Admitting: *Deleted

## 2020-07-20 ENCOUNTER — Ambulatory Visit (INDEPENDENT_AMBULATORY_CARE_PROVIDER_SITE_OTHER): Payer: BC Managed Care – PPO | Admitting: Emergency Medicine

## 2020-07-20 ENCOUNTER — Other Ambulatory Visit: Payer: Self-pay

## 2020-07-20 ENCOUNTER — Encounter: Payer: Self-pay | Admitting: Emergency Medicine

## 2020-07-20 VITALS — BP 146/72 | HR 100 | Temp 97.8°F | Ht 64.0 in | Wt 209.0 lb

## 2020-07-20 DIAGNOSIS — I10 Essential (primary) hypertension: Secondary | ICD-10-CM | POA: Diagnosis not present

## 2020-07-20 DIAGNOSIS — G43719 Chronic migraine without aura, intractable, without status migrainosus: Secondary | ICD-10-CM | POA: Insufficient documentation

## 2020-07-20 MED ORDER — BUTALBITAL-APAP-CAFFEINE 50-325-40 MG PO TABS: 1.0000 | ORAL_TABLET | Freq: Four times a day (QID) | ORAL | 0 refills | Status: DC | PRN

## 2020-07-20 MED ORDER — LISINOPRIL 40 MG PO TABS: 40.0000 mg | ORAL_TABLET | Freq: Every day | ORAL | 3 refills | Status: DC

## 2020-07-20 NOTE — Assessment & Plan Note (Signed)
Not well controlled at present time.  Continue Relpax and start Fioricet as needed. FMLA paperwork completed with patient's aid.

## 2020-07-20 NOTE — Patient Instructions (Addendum)
If you have lab work done today you will be contacted with your lab results within the next 2 weeks.  If you have not heard from [place] then please contact us. The fastest way to get your results is to register for My Chart.   IF you received an x-ray today, you will receive an invoice from [facility]. Please contact [facility] at [phone] with questions or concerns regarding your invoice.   IF you received labwork today, you will receive an invoice from [facility]. Please contact [facility] at [phone] with questions or concerns regarding your invoice.   Our billing staff will not be able to assist you with questions regarding bills from these companies.  You will be contacted with the lab results as soon as they are available. The fastest way to get your results is to activate your My Chart account. Instructions are located on the last page of this paperwork. If you have not heard from [place] regarding the results in 2 weeks, please contact this office.      Migraine Headache A migraine headache is a very strong throbbing pain on one side or both sides of your head. This type of headache can also cause other symptoms. It can last from 4 hours to 3 days. Talk with your doctor about what things may bring on (trigger) this condition. What are the causes? The exact cause of this condition is not known. This condition may be triggered or caused by:  Drinking alcohol.  Smoking.  Taking medicines, such as: ? Medicine used to treat chest pain (nitroglycerin). ? Birth control pills. ? Estrogen. ? Some blood pressure medicines.  Eating or drinking certain products.  Doing physical activity. Other things that may trigger a migraine headache include:  Having a menstrual period.  Pregnancy.  Hunger.  Stress.  Not getting enough sleep or getting too much sleep.  Weather changes.  Tiredness (fatigue). What increases the risk?  Being 48-55 years  old.  Being female.  Having a family history of migraine headaches.  Being Caucasian.  Having depression or anxiety.  Being very overweight. What are the signs or symptoms?  A throbbing pain. This pain may: ? Happen in any area of the head, such as on one side or both sides. ? Make it hard to do daily activities. ? Get worse with physical activity. ? Get worse around bright lights or loud noises.  Other symptoms may include: ? Feeling sick to your stomach (nauseous). ? Vomiting. ? Dizziness. ? Being sensitive to bright lights, loud noises, or smells.  Before you get a migraine headache, you may get warning signs (an aura). An aura may include: ? Seeing flashing lights or having blind spots. ? Seeing bright spots, halos, or zigzag lines. ? Having tunnel vision or blurred vision. ? Having numbness or a tingling feeling. ? Having trouble talking. ? Having weak muscles.  Some people have symptoms after a migraine headache (postdromal phase), such as: ? Tiredness. ? Trouble thinking (concentrating). How is this treated?  Taking medicines that: ? Relieve pain. ? Relieve the feeling of being sick to your stomach. ? Prevent migraine headaches.  Treatment may also include: ? Having acupuncture. ? Avoiding foods that bring on migraine headaches. ? Learning ways to control your body functions (biofeedback). ? Therapy to help you know and deal with negative thoughts (cognitive behavioral therapy). Follow these instructions at home: Medicines  Take over-the-counter and prescription medicines only as told by your doctor.  Ask  your doctor if the medicine prescribed to you: ? Requires you to avoid driving or using heavy machinery. ? Can cause trouble pooping (constipation). You may need to take these steps to prevent or treat trouble pooping:  Drink enough fluid to keep your pee (urine) pale yellow.  Take over-the-counter or prescription medicines.  Eat foods that are  high in fiber. These include beans, whole grains, and fresh fruits and vegetables.  Limit foods that are high in fat and sugar. These include fried or sweet foods. Lifestyle  Do not drink alcohol.  Do not use any products that contain nicotine or tobacco, such as cigarettes, e-cigarettes, and chewing tobacco. If you need help quitting, ask your doctor.  Get at least 8 hours of sleep every night.  Limit and deal with stress. General instructions      Keep a journal to find out what may bring on your migraine headaches. For example, write down: ? What you eat and drink. ? How much sleep you get. ? Any change in what you eat or drink. ? Any change in your medicines.  If you have a migraine headache: ? Avoid things that make your symptoms worse, such as bright lights. ? It may help to lie down in a dark, quiet room. ? Do not drive or use heavy machinery. ? Ask your doctor what activities are safe for you.  Keep all follow-up visits as told by your doctor. This is important. Contact a doctor if:  You get a migraine headache that is different or worse than others you have had.  You have more than 15 headache days in one month. Get help right away if:  Your migraine headache gets very bad.  Your migraine headache lasts longer than 72 hours.  You have a fever.  You have a stiff neck.  You have trouble seeing.  Your muscles feel weak or like you cannot control them.  You start to lose your balance a lot.  You start to have trouble walking.  You pass out (faint).  You have a seizure. Summary  A migraine headache is a very strong throbbing pain on one side or both sides of your head. These headaches can also cause other symptoms.  This condition may be treated with medicines and changes to your lifestyle.  Keep a journal to find out what may bring on your migraine headaches.  Contact a doctor if you get a migraine headache that is different or worse than others  you have had.  Contact your doctor if you have more than 15 headache days in a month. This information is not intended to replace advice given to you by your health care provider. Make sure you discuss any questions you have with your health care provider. Document Revised: 03/06/2019 Document Reviewed: 12/25/2018 Elsevier Patient Education  [place].  Hypertension, Adult High blood pressure (hypertension) is when the force of blood pumping through the arteries is too strong. The arteries are the blood vessels that carry blood from the heart throughout the body. Hypertension forces the heart to work harder to pump blood and may cause arteries to become narrow or stiff. Untreated or uncontrolled hypertension can cause a heart attack, heart failure, a stroke, kidney disease, and other problems. A blood pressure reading consists of a higher number over a lower number. Ideally, your blood pressure should be below 120/80. The first ("top") number is called the systolic pressure. It is a measure of the pressure in your arteries as  your heart beats. The second ("bottom") number is called the diastolic pressure. It is a measure of the pressure in your arteries as the heart relaxes. What are the causes? The exact cause of this condition is not known. There are some conditions that result in or are related to high blood pressure. What increases the risk? Some risk factors for high blood pressure are under your control. The following factors may make you more likely to develop this condition:  Smoking.  Having type 2 diabetes mellitus, high cholesterol, or both.  Not getting enough exercise or physical activity.  Being overweight.  Having too much fat, sugar, calories, or salt (sodium) in your diet.  Drinking too much alcohol. Some risk factors for high blood pressure may be difficult or impossible to change. Some of these factors include:  Having chronic kidney disease.  Having a family  history of high blood pressure.  Age. Risk increases with age.  Race. You may be at higher risk if you are African American.  Gender. Men are at higher risk than women before age 40. After age 38, women are at higher risk than men.  Having obstructive sleep apnea.  Stress. What are the signs or symptoms? High blood pressure may not cause symptoms. Very high blood pressure (hypertensive crisis) may cause:  Headache.  Anxiety.  Shortness of breath.  Nosebleed.  Nausea and vomiting.  Vision changes.  Severe chest pain.  Seizures. How is this diagnosed? This condition is diagnosed by measuring your blood pressure while you are seated, with your arm resting on a flat surface, your legs uncrossed, and your feet flat on the floor. The cuff of the blood pressure monitor will be placed directly against the skin of your upper arm at the level of your heart. It should be measured at least twice using the same arm. Certain conditions can cause a difference in blood pressure between your right and left arms. Certain factors can cause blood pressure readings to be lower or higher than normal for a short period of time:  When your blood pressure is higher when you are in a health care provider's office than when you are at home, this is called white coat hypertension. Most people with this condition do not need medicines.  When your blood pressure is higher at home than when you are in a health care provider's office, this is called masked hypertension. Most people with this condition may need medicines to control blood pressure. If you have a high blood pressure reading during one visit or you have normal blood pressure with other risk factors, you may be asked to:  Return on a different day to have your blood pressure checked again.  Monitor your blood pressure at home for 1 week or longer. If you are diagnosed with hypertension, you may have other blood or imaging tests to help your  health care provider understand your overall risk for other conditions. How is this treated? This condition is treated by making healthy lifestyle changes, such as eating healthy foods, exercising more, and reducing your alcohol intake. Your health care provider may prescribe medicine if lifestyle changes are not enough to get your blood pressure under control, and if:  Your systolic blood pressure is above 130.  Your diastolic blood pressure is above 80. Your personal target blood pressure may vary depending on your medical conditions, your age, and other factors. Follow these instructions at home: Eating and drinking   Eat a diet that is high  in fiber and potassium, and low in sodium, added sugar, and fat. An example eating plan is called the DASH (Dietary Approaches to Stop Hypertension) diet. To eat this way: ? Eat plenty of fresh fruits and vegetables. Try to fill one half of your plate at each meal with fruits and vegetables. ? Eat whole grains, such as whole-wheat pasta, brown rice, or whole-grain bread. Fill about one fourth of your plate with whole grains. ? Eat or drink low-fat dairy products, such as skim milk or low-fat yogurt. ? Avoid fatty cuts of meat, processed or cured meats, and poultry with skin. Fill about one fourth of your plate with lean proteins, such as fish, chicken without skin, beans, eggs, or tofu. ? Avoid pre-made and processed foods. These tend to be higher in sodium, added sugar, and fat.  Reduce your daily sodium intake. Most people with hypertension should eat less than 1,500 mg of sodium a day.  Do not drink alcohol if: ? Your health care provider tells you not to drink. ? You are pregnant, may be pregnant, or are planning to become pregnant.  If you drink alcohol: ? Limit how much you use to:  0-1 drink a day for women.  0-2 drinks a day for men. ? Be aware of how much alcohol is in your drink. In the [place] one drink equals one 12 oz bottle of beer  (355 mL), one 5 oz glass of wine (148 mL), or one 1 oz glass of hard liquor (44 mL). Lifestyle   Work with your health care provider to maintain a healthy body weight or to lose weight. Ask what an ideal weight is for you.  Get at least 30 minutes of exercise most days of the week. Activities may include walking, swimming, or biking.  Include exercise to strengthen your muscles (resistance exercise), such as Pilates or lifting weights, as part of your weekly exercise routine. Try to do these types of exercises for 30 minutes at least 3 days a week.  Do not use any products that contain nicotine or tobacco, such as cigarettes, e-cigarettes, and chewing tobacco. If you need help quitting, ask your health care provider.  Monitor your blood pressure at home as told by your health care provider.  Keep all follow-up visits as told by your health care provider. This is important. Medicines  Take over-the-counter and prescription medicines only as told by your health care provider. Follow directions carefully. Blood pressure medicines must be taken as prescribed.  Do not skip doses of blood pressure medicine. Doing this puts you at risk for problems and can make the medicine less effective.  Ask your health care provider about side effects or reactions to medicines that you should watch for. Contact a health care provider if you:  Think you are having a reaction to a medicine you are taking.  Have headaches that keep coming back (recurring).  Feel dizzy.  Have swelling in your ankles.  Have trouble with your vision. Get help right away if you:  Develop a severe headache or confusion.  Have unusual weakness or numbness.  Feel faint.  Have severe pain in your chest or abdomen.  Vomit repeatedly.  Have trouble breathing. Summary  Hypertension is when the force of blood pumping through your arteries is too strong. If this condition is not controlled, it may put you at risk for  serious complications.  Your personal target blood pressure may vary depending on your medical conditions, your age, and other  factors. For most people, a normal blood pressure is less than 120/80.  Hypertension is treated with lifestyle changes, medicines, or a combination of both. Lifestyle changes include losing weight, eating a healthy, low-sodium diet, exercising more, and limiting alcohol. This information is not intended to replace advice given to you by your health care provider. Make sure you discuss any questions you have with your health care provider. Document Revised: 07/23/2018 Document Reviewed: 07/23/2018 Elsevier Patient Education  2020 [employer].

## 2020-07-20 NOTE — Assessment & Plan Note (Signed)
Uncontrolled hypertension.  Will increase lisinopril to 40 mg daily. Follow-up in 3 months.

## 2020-07-20 NOTE — Telephone Encounter (Signed)
Faxed completed FMLA/Disability forms to [place] [employer] to [phone]. Case # [id number] IFN. Confirmation page 3:34 pm and patient received copies.

## 2020-07-20 NOTE — Progress Notes (Signed)
Amy Charles 48 y.o.   Chief Complaint  Patient presents with   Migraine    fmla form to be filled out    HISTORY OF PRESENT ILLNESS: This is a 49 y.o. female with history of recurrent migraine headaches and hypertension.  Still having atypical migraine headache. Took Relpax but not helping this time around. Blood pressure readings at home average 170/90. No other complaints or medical concerns. Also requesting to have FMLA paperwork filled out.   BP Readings from Last 3 Encounters:  07/20/20 (!) 146/72  07/07/20 (!) 186/109  06/02/20 (!) 149/83    HPI   Prior to Admission medications   Medication Sig Start Date End Date Taking? Authorizing Provider  ALPRAZolam Duanne Moron) 1 MG tablet Take 0.5 tablets (0.5 mg total) by mouth daily as needed for anxiety. 07/07/20  Yes Jehan Bonano, Ines Bloomer, MD  eletriptan (RELPAX) 20 MG tablet Take 1 tablet (20 mg total) by mouth as needed for migraine or headache. May repeat in 2 hours if headache persists or recurs. 07/07/20  Yes Jyaire Koudelka, Ines Bloomer, MD  escitalopram (LEXAPRO) 10 MG tablet Take 1 tablet (10 mg total) by mouth daily. 07/07/20 10/05/20 Yes Leyland Kenna, Ines Bloomer, MD  levocetirizine (XYZAL) 5 MG tablet Take 1 tablet (5 mg total) by mouth every evening. 06/19/18  Yes Jaynee Eagles, PA-C  lisinopril (ZESTRIL) 10 MG tablet Take 1 tablet (10 mg total) by mouth daily. 07/07/20  Yes Abbigaile Rockman, Ines Bloomer, MD  traMADol (ULTRAM) 50 MG tablet Take 1 tablet (50 mg total) by mouth every 6 (six) hours as needed. 06/02/20 06/02/21 Yes Laban Emperor, PA-C  varenicline (CHANTIX STARTING MONTH PAK) 0.5 MG X 11 & 1 MG X 42 tablet Take one 0.5 mg tablet by mouth once daily for 3 days, then increase to one 0.5 mg tablet twice daily for 4 days, then increase to one 1 mg tablet twice daily. 10/15/19  Yes Horald Pollen, MD    Allergies  Allergen Reactions   Augmentin [Amoxicillin-Pot Clavulanate] Nausea And Vomiting   Sumatriptan     convulsions    Tetracyclines & Related     Patient Active Problem List   Diagnosis Date Noted   Chronic diarrhea 07/18/2018   Situational anxiety 07/18/2018   Essential hypertension 05/14/2018   Generalized anxiety disorder 05/14/2018   Chronic daily headache 05/18/2012    Past Medical History:  Diagnosis Date   Allergy    Anxiety    Cancer ([place])    Depression    Gallstones    GERD (gastroesophageal reflux disease)    IBS (irritable bowel syndrome)    Migraines     Past Surgical History:  Procedure Laterality Date   BREAST BIOPSY     CHOLECYSTECTOMY N/A 12/25/2017   Procedure: LAPAROSCOPIC CHOLECYSTECTOMY;  Surgeon: Olean Ree, MD;  Location: [facility] ORS;  Service: General;  Laterality: N/A;   NOVASURE ABLATION     TUBAL LIGATION      Social History   Socioeconomic History   Marital status: Married    Spouse name: Not on file   Number of children: Not on file   Years of education: Not on file   Highest education level: Not on file  Occupational History   Not on file  Tobacco Use   Smoking status: Current Every Day Smoker    Packs/day: 0.50    Years: 30.00    Pack years: 15.00    Types: Cigarettes   Smokeless tobacco: Never Used   Tobacco comment: off/on  Substance and Sexual Activity   Alcohol use: No   Drug use: No   Sexual activity: Not on file  Other Topics Concern   Not on file  Social History Narrative   Not on file   Social Determinants of Health   Financial Resource Strain:    Difficulty of Paying Living Expenses: Not on file  Food Insecurity:    Worried About [place] in the Last Year: Not on file   Ran Out of Food in the Last Year: Not on file  Transportation Needs:    Lack of Transportation (Medical): Not on file   Lack of Transportation (Non-Medical): Not on file  Physical Activity:    Days of Exercise per Week: Not on file   Minutes of Exercise per Session: Not on file  Stress:    Feeling of Stress  : Not on file  Social Connections:    Frequency of Communication with Friends and Family: Not on file   Frequency of Social Gatherings with Friends and Family: Not on file   Attends Religious Services: Not on file   Active Member of Clubs or Organizations: Not on file   Attends Archivist Meetings: Not on file   Marital Status: Not on file  Intimate Partner Violence:    Fear of Current or Ex-Partner: Not on file   Emotionally Abused: Not on file   Physically Abused: Not on file   Sexually Abused: Not on file    Family History  Problem Relation Age of Onset   Diabetes Mother    Heart disease Mother    Hyperlipidemia Mother    Hypertension Mother    Heart disease Father    Hyperlipidemia Father    Hypertension Father    Cancer Father    Hyperlipidemia Brother    Hypertension Brother    Hyperlipidemia Brother    Hypertension Brother      Review of Systems  Constitutional: Negative.  Negative for chills and fever.  HENT: Negative.  Negative for congestion and sore throat.   Respiratory: Negative.  Negative for cough and shortness of breath.   Cardiovascular: Negative.  Negative for chest pain and palpitations.  Gastrointestinal: Negative for abdominal pain, diarrhea, nausea and vomiting.  Skin: Negative.  Negative for rash.  Neurological: Positive for headaches.  All other systems reviewed and are negative.    Today's Vitals   07/20/20 1458  BP: (!) 146/72  Pulse: 100  Temp: 97.8 F (36.6 C)  TempSrc: Temporal  SpO2: 95%  Weight: 209 lb (94.8 kg)  Height: 5\' 4"  (1.626 m)   Body mass index is 35.87 kg/m.   Physical Exam Vitals reviewed.  Constitutional:      Appearance: Normal appearance.  HENT:     Head: Normocephalic.  Eyes:     Extraocular Movements: Extraocular movements intact.     Conjunctiva/sclera: Conjunctivae normal.     Pupils: Pupils are equal, round, and reactive to light.  Cardiovascular:     Rate and  Rhythm: Normal rate and regular rhythm.     Pulses: Normal pulses.     Heart sounds: Normal heart sounds.  Pulmonary:     Effort: Pulmonary effort is normal.     Breath sounds: Normal breath sounds.  Musculoskeletal:        General: Normal range of motion.     Cervical back: Normal range of motion and neck supple.  Skin:    General: Skin is warm and dry.  Capillary Refill: Capillary refill takes less than 2 seconds.  Neurological:     General: No focal deficit present.     Mental Status: She is alert and oriented to person, place, and time.  Psychiatric:        Mood and Affect: Mood normal.        Behavior: Behavior normal.    A total of 30 minutes was spent with the patient, greater than 50% of which was in counseling/coordination of care regarding hypertension and adjustment of medication doses, cardiovascular risk associated with this condition, diet and nutrition, migraine and treatment including medications Relpax and Fioricet, FMLA paperwork, prognosis and need for follow-up.   ASSESSMENT & PLAN:  Essential hypertension Uncontrolled hypertension.  Will increase lisinopril to 40 mg daily. Follow-up in 3 months.  Intractable chronic migraine without aura and without status migrainosus Not well controlled at present time.  Continue Relpax and start Fioricet as needed. FMLA paperwork completed with patient's aid.  Penina was seen today for migraine and migraine.  Diagnoses and all orders for this visit:  Essential hypertension -     lisinopril (ZESTRIL) 40 MG tablet; Take 1 tablet (40 mg total) by mouth daily.  Intractable chronic migraine without aura and without status migrainosus -     butalbital-acetaminophen-caffeine (FIORICET) 50-325-40 MG tablet; Take 1-2 tablets by mouth every 6 (six) hours as needed for headache.  Uncontrolled hypertension    Patient Instructions       If you have lab work done today you will be contacted with your lab results within  the next 2 weeks.  If you have not heard from [place] then please contact us. The fastest way to get your results is to register for My Chart.   IF you received an x-ray today, you will receive an invoice from [facility]. Please contact [facility] at [phone] with questions or concerns regarding your invoice.   IF you received labwork today, you will receive an invoice from [facility]. Please contact [facility] at [phone] with questions or concerns regarding your invoice.   Our billing staff will not be able to assist you with questions regarding bills from these companies.  You will be contacted with the lab results as soon as they are available. The fastest way to get your results is to activate your My Chart account. Instructions are located on the last page of this paperwork. If you have not heard from [place] regarding the results in 2 weeks, please contact this office.      Migraine Headache A migraine headache is a very strong throbbing pain on one side or both sides of your head. This type of headache can also cause other symptoms. It can last from 4 hours to 3 days. Talk with your doctor about what things may bring on (trigger) this condition. What are the causes? The exact cause of this condition is not known. This condition may be triggered or caused by:  Drinking alcohol.  Smoking.  Taking medicines, such as: ? Medicine used to treat chest pain (nitroglycerin). ? Birth control pills. ? Estrogen. ? Some blood pressure medicines.  Eating or drinking certain products.  Doing physical activity. Other things that may trigger a migraine headache include:  Having a menstrual period.  Pregnancy.  Hunger.  Stress.  Not getting enough sleep or getting too much sleep.  Weather changes.  Tiredness (fatigue). What increases the risk?  Being 68-83 years old.  Being female.  Having a family history of migraine headaches.  Being Caucasian.  Having  depression or anxiety.  Being very overweight. What are the signs or symptoms?  A throbbing pain. This pain may: ? Happen in any area of the head, such as on one side or both sides. ? Make it hard to do daily activities. ? Get worse with physical activity. ? Get worse around bright lights or loud noises.  Other symptoms may include: ? Feeling sick to your stomach (nauseous). ? Vomiting. ? Dizziness. ? Being sensitive to bright lights, loud noises, or smells.  Before you get a migraine headache, you may get warning signs (an aura). An aura may include: ? Seeing flashing lights or having blind spots. ? Seeing bright spots, halos, or zigzag lines. ? Having tunnel vision or blurred vision. ? Having numbness or a tingling feeling. ? Having trouble talking. ? Having weak muscles.  Some people have symptoms after a migraine headache (postdromal phase), such as: ? Tiredness. ? Trouble thinking (concentrating). How is this treated?  Taking medicines that: ? Relieve pain. ? Relieve the feeling of being sick to your stomach. ? Prevent migraine headaches.  Treatment may also include: ? Having acupuncture. ? Avoiding foods that bring on migraine headaches. ? Learning ways to control your body functions (biofeedback). ? Therapy to help you know and deal with negative thoughts (cognitive behavioral therapy). Follow these instructions at home: Medicines  Take over-the-counter and prescription medicines only as told by your doctor.  Ask your doctor if the medicine prescribed to you: ? Requires you to avoid driving or using heavy machinery. ? Can cause trouble pooping (constipation). You may need to take these steps to prevent or treat trouble pooping:  Drink enough fluid to keep your pee (urine) pale yellow.  Take over-the-counter or prescription medicines.  Eat foods that are high in fiber. These include beans, whole grains, and fresh fruits and vegetables.  Limit foods that  are high in fat and sugar. These include fried or sweet foods. Lifestyle  Do not drink alcohol.  Do not use any products that contain nicotine or tobacco, such as cigarettes, e-cigarettes, and chewing tobacco. If you need help quitting, ask your doctor.  Get at least 8 hours of sleep every night.  Limit and deal with stress. General instructions      Keep a journal to find out what may bring on your migraine headaches. For example, write down: ? What you eat and drink. ? How much sleep you get. ? Any change in what you eat or drink. ? Any change in your medicines.  If you have a migraine headache: ? Avoid things that make your symptoms worse, such as bright lights. ? It may help to lie down in a dark, quiet room. ? Do not drive or use heavy machinery. ? Ask your doctor what activities are safe for you.  Keep all follow-up visits as told by your doctor. This is important. Contact a doctor if:  You get a migraine headache that is different or worse than others you have had.  You have more than 15 headache days in one month. Get help right away if:  Your migraine headache gets very bad.  Your migraine headache lasts longer than 72 hours.  You have a fever.  You have a stiff neck.  You have trouble seeing.  Your muscles feel weak or like you cannot control them.  You start to lose your balance a lot.  You start to have trouble walking.  You pass out (faint).  You have a seizure. Summary  A migraine headache is a very strong throbbing pain on one side or both sides of your head. These headaches can also cause other symptoms.  This condition may be treated with medicines and changes to your lifestyle.  Keep a journal to find out what may bring on your migraine headaches.  Contact a doctor if you get a migraine headache that is different or worse than others you have had.  Contact your doctor if you have more than 15 headache days in a month. This information  is not intended to replace advice given to you by your health care provider. Make sure you discuss any questions you have with your health care provider. Document Revised: 03/06/2019 Document Reviewed: 12/25/2018 Elsevier Patient Education  [place].  Hypertension, Adult High blood pressure (hypertension) is when the force of blood pumping through the arteries is too strong. The arteries are the blood vessels that carry blood from the heart throughout the body. Hypertension forces the heart to work harder to pump blood and may cause arteries to become narrow or stiff. Untreated or uncontrolled hypertension can cause a heart attack, heart failure, a stroke, kidney disease, and other problems. A blood pressure reading consists of a higher number over a lower number. Ideally, your blood pressure should be below 120/80. The first ("top") number is called the systolic pressure. It is a measure of the pressure in your arteries as your heart beats. The second ("bottom") number is called the diastolic pressure. It is a measure of the pressure in your arteries as the heart relaxes. What are the causes? The exact cause of this condition is not known. There are some conditions that result in or are related to high blood pressure. What increases the risk? Some risk factors for high blood pressure are under your control. The following factors may make you more likely to develop this condition:  Smoking.  Having type 2 diabetes mellitus, high cholesterol, or both.  Not getting enough exercise or physical activity.  Being overweight.  Having too much fat, sugar, calories, or salt (sodium) in your diet.  Drinking too much alcohol. Some risk factors for high blood pressure may be difficult or impossible to change. Some of these factors include:  Having chronic kidney disease.  Having a family history of high blood pressure.  Age. Risk increases with age.  Race. You may be at higher risk if  you are African American.  Gender. Men are at higher risk than women before age 56. After age 41, women are at higher risk than men.  Having obstructive sleep apnea.  Stress. What are the signs or symptoms? High blood pressure may not cause symptoms. Very high blood pressure (hypertensive crisis) may cause:  Headache.  Anxiety.  Shortness of breath.  Nosebleed.  Nausea and vomiting.  Vision changes.  Severe chest pain.  Seizures. How is this diagnosed? This condition is diagnosed by measuring your blood pressure while you are seated, with your arm resting on a flat surface, your legs uncrossed, and your feet flat on the floor. The cuff of the blood pressure monitor will be placed directly against the skin of your upper arm at the level of your heart. It should be measured at least twice using the same arm. Certain conditions can cause a difference in blood pressure between your right and left arms. Certain factors can cause blood pressure readings to be lower or higher than normal for a short period of  time:  When your blood pressure is higher when you are in a health care provider's office than when you are at home, this is called white coat hypertension. Most people with this condition do not need medicines.  When your blood pressure is higher at home than when you are in a health care provider's office, this is called masked hypertension. Most people with this condition may need medicines to control blood pressure. If you have a high blood pressure reading during one visit or you have normal blood pressure with other risk factors, you may be asked to:  Return on a different day to have your blood pressure checked again.  Monitor your blood pressure at home for 1 week or longer. If you are diagnosed with hypertension, you may have other blood or imaging tests to help your health care provider understand your overall risk for other conditions. How is this treated? This  condition is treated by making healthy lifestyle changes, such as eating healthy foods, exercising more, and reducing your alcohol intake. Your health care provider may prescribe medicine if lifestyle changes are not enough to get your blood pressure under control, and if:  Your systolic blood pressure is above 130.  Your diastolic blood pressure is above 80. Your personal target blood pressure may vary depending on your medical conditions, your age, and other factors. Follow these instructions at home: Eating and drinking   Eat a diet that is high in fiber and potassium, and low in sodium, added sugar, and fat. An example eating plan is called the DASH (Dietary Approaches to Stop Hypertension) diet. To eat this way: ? Eat plenty of fresh fruits and vegetables. Try to fill one half of your plate at each meal with fruits and vegetables. ? Eat whole grains, such as whole-wheat pasta, brown rice, or whole-grain bread. Fill about one fourth of your plate with whole grains. ? Eat or drink low-fat dairy products, such as skim milk or low-fat yogurt. ? Avoid fatty cuts of meat, processed or cured meats, and poultry with skin. Fill about one fourth of your plate with lean proteins, such as fish, chicken without skin, beans, eggs, or tofu. ? Avoid pre-made and processed foods. These tend to be higher in sodium, added sugar, and fat.  Reduce your daily sodium intake. Most people with hypertension should eat less than 1,500 mg of sodium a day.  Do not drink alcohol if: ? Your health care provider tells you not to drink. ? You are pregnant, may be pregnant, or are planning to become pregnant.  If you drink alcohol: ? Limit how much you use to:  0-1 drink a day for women.  0-2 drinks a day for men. ? Be aware of how much alcohol is in your drink. In the [place] one drink equals one 12 oz bottle of beer (355 mL), one 5 oz glass of wine (148 mL), or one 1 oz glass of hard liquor (44  mL). Lifestyle   Work with your health care provider to maintain a healthy body weight or to lose weight. Ask what an ideal weight is for you.  Get at least 30 minutes of exercise most days of the week. Activities may include walking, swimming, or biking.  Include exercise to strengthen your muscles (resistance exercise), such as Pilates or lifting weights, as part of your weekly exercise routine. Try to do these types of exercises for 30 minutes at least 3 days a week.  Do not use any products  that contain nicotine or tobacco, such as cigarettes, e-cigarettes, and chewing tobacco. If you need help quitting, ask your health care provider.  Monitor your blood pressure at home as told by your health care provider.  Keep all follow-up visits as told by your health care provider. This is important. Medicines  Take over-the-counter and prescription medicines only as told by your health care provider. Follow directions carefully. Blood pressure medicines must be taken as prescribed.  Do not skip doses of blood pressure medicine. Doing this puts you at risk for problems and can make the medicine less effective.  Ask your health care provider about side effects or reactions to medicines that you should watch for. Contact a health care provider if you:  Think you are having a reaction to a medicine you are taking.  Have headaches that keep coming back (recurring).  Feel dizzy.  Have swelling in your ankles.  Have trouble with your vision. Get help right away if you:  Develop a severe headache or confusion.  Have unusual weakness or numbness.  Feel faint.  Have severe pain in your chest or abdomen.  Vomit repeatedly.  Have trouble breathing. Summary  Hypertension is when the force of blood pumping through your arteries is too strong. If this condition is not controlled, it may put you at risk for serious complications.  Your personal target blood pressure may vary depending on  your medical conditions, your age, and other factors. For most people, a normal blood pressure is less than 120/80.  Hypertension is treated with lifestyle changes, medicines, or a combination of both. Lifestyle changes include losing weight, eating a healthy, low-sodium diet, exercising more, and limiting alcohol. This information is not intended to replace advice given to you by your health care provider. Make sure you discuss any questions you have with your health care provider. Document Revised: 07/23/2018 Document Reviewed: 07/23/2018 Elsevier Patient Education  2020 Elsevier Inc.      Agustina Caroli, MD [facility]

## 2020-08-18 ENCOUNTER — Encounter: Payer: Self-pay | Admitting: Emergency Medicine

## 2020-08-23 ENCOUNTER — Telehealth: Payer: Self-pay | Admitting: *Deleted

## 2020-08-23 NOTE — Telephone Encounter (Signed)
Left message in voice mail to call back with date(s) to be added to FMLA already submitted 07/20/2020.

## 2020-08-24 ENCOUNTER — Telehealth (INDEPENDENT_AMBULATORY_CARE_PROVIDER_SITE_OTHER): Payer: BC Managed Care – PPO | Admitting: Emergency Medicine

## 2020-08-24 ENCOUNTER — Encounter: Payer: Self-pay | Admitting: Emergency Medicine

## 2020-08-24 ENCOUNTER — Telehealth: Payer: Self-pay | Admitting: *Deleted

## 2020-08-24 ENCOUNTER — Other Ambulatory Visit: Payer: Self-pay

## 2020-08-24 DIAGNOSIS — G43111 Migraine with aura, intractable, with status migrainosus: Secondary | ICD-10-CM

## 2020-08-24 MED ORDER — BUTALBITAL-APAP-CAFFEINE 50-325-40 MG PO TABS: 1.0000 | ORAL_TABLET | Freq: Four times a day (QID) | ORAL | 1 refills | Status: DC | PRN

## 2020-08-24 NOTE — Telephone Encounter (Signed)
Spoke to patient concerning FMLA ppw that was faxed to [facility] on 07/20/2020. The patient states the employer needs the doctor to write on the form or send a letter about multiple times and days of absences about the headaches . The patient states since the last office visit 07/20/2020 the migraines has not stopped. She has missed some days from work and other times half, also has been written up. Patient has a virtual appointment this morning and will discuss it with the doctor.

## 2020-08-24 NOTE — Patient Instructions (Signed)
° ° ° °  If you have lab work done today you will be contacted with your lab results within the next 2 weeks.  If you have not heard from us then please contact us. The fastest way to get your results is to register for My Chart. ° ° °IF you received an x-ray today, you will receive an invoice from [facility]. Please contact [facility] at [phone] with questions or concerns regarding your invoice.  ° °IF you received labwork today, you will receive an invoice from [facility]. Please contact [facility] at [phone] with questions or concerns regarding your invoice.  ° °Our billing staff will not be able to assist you with questions regarding bills from these companies. ° °You will be contacted with the lab results as soon as they are available. The fastest way to get your results is to activate your My Chart account. Instructions are located on the last page of this paperwork. If you have not heard from us regarding the results in 2 weeks, please contact this office. °  ° ° ° °

## 2020-08-24 NOTE — Telephone Encounter (Signed)
Spoke to patient this morning concerning FMLA ppw (migraines) faxed to [facility] on 07/20/2020. Also, patient had a virtual appt with Dr Mitchel Honour this morning about her migraines and needing addendum to missing add'l days not on FMLA. Addendum re faxed to [facility].

## 2020-08-24 NOTE — Progress Notes (Addendum)
Telemedicine Encounter- SOAP NOTE Established Patient MyChart video conference Patient: Home  Provider: Office     This video telephone encounter was conducted with the patient's (or proxy's) verbal consent via video audio telecommunications: yes/no: Yes Patient was instructed to have this encounter in a suitably private space; and to only have persons present to whom they give permission to participate. In addition, patient identity was confirmed by use of name plus two identifiers (DOB and address).  I discussed the limitations, risks, security and privacy concerns of performing an evaluation and management service by telephone and the availability of in person appointments. I also discussed with the patient that there may be a patient responsible charge related to this service. The patient expressed understanding and agreed to proceed.  I spent a total of TIME; 0 MIN TO 60 MIN: 20 minutes talking with the patient or their proxy.  Chief Complaint  Patient presents with  . Migraine    currently having conisitant HA since August.  . Nausea    from migraine    Subjective   Amy Charles is a 49 y.o. female established patient. Telephone visit today complaining of persistent typical migraine headache since last office visit 07/20/2020.  Present medication not helping.  No new symptoms.  HPI   Patient Active Problem List   Diagnosis Date Noted  . 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 Chronic diarrhea 07/18/2018  . Situational anxiety 07/18/2018  . Essential hypertension 05/14/2018  . Generalized anxiety disorder 05/14/2018  . Chronic daily headache 05/18/2012    Past Medical History:  Diagnosis Date  . Allergy   . Anxiety   . Cancer ([place])   . Depression   . Gallstones   . GERD (gastroesophageal reflux disease)   . IBS (irritable bowel syndrome)   . Migraines     Current Outpatient Medications  Medication Sig Dispense  Refill  . ALPRAZolam (XANAX) 1 MG tablet Take 0.5 tablets (0.5 mg total) by mouth daily as needed for anxiety. 30 tablet 1  . butalbital-acetaminophen-caffeine (FIORICET) 50-325-40 MG tablet Take 1-2 tablets by mouth every 6 (six) hours as needed for headache. 20 tablet 0  . eletriptan (RELPAX) 20 MG tablet Take 1 tablet (20 mg total) by mouth as needed for migraine or headache. May repeat in 2 hours if headache persists or recurs. 10 tablet 2  . escitalopram (LEXAPRO) 10 MG tablet Take 1 tablet (10 mg total) by mouth daily. 90 tablet 1  . levocetirizine (XYZAL) 5 MG tablet Take 1 tablet (5 mg total) by mouth every evening. 90 tablet 3  . lisinopril (ZESTRIL) 40 MG tablet Take 1 tablet (40 mg total) by mouth daily. 90 tablet 3  . varenicline (CHANTIX STARTING MONTH PAK) 0.5 MG X 11 & 1 MG X 42 tablet Take one 0.5 mg tablet by mouth once daily for 3 days, then increase to one 0.5 mg tablet twice daily for 4 days, then increase to one 1 mg tablet twice daily. (Patient not taking: Reported on 08/24/2020) 53 tablet 0   No current facility-administered medications for this visit.    Allergies  Allergen Reactions  . Augmentin [Amoxicillin-Pot Clavulanate] Nausea And Vomiting  . Sumatriptan     convulsions  . Tetracyclines & Related     Social History   Socioeconomic History  . Marital status: Married    Spouse name: Not on file  . Number of children: Not on file  . Years of  education: Not on file  . Highest education level: Not on file  Occupational History  . Not on file  Tobacco Use  . Smoking status: Current Every Day Smoker    Packs/day: 0.50    Years: 30.00    Pack years: 15.00    Types: Cigarettes  . Smokeless tobacco: Never Used  . Tobacco comment: off/on  Substance and Sexual Activity  . Alcohol use: No  . Drug use: No  . Sexual activity: Not on file  Other Topics Concern  . Not on file  Social History Narrative  . Not on file   Social Determinants of Health    Financial Resource Strain:   . Difficulty of Paying Living Expenses: Not on file  Food Insecurity:   . Worried About Charity fundraiser in the Last Year: Not on file  . Ran Out of Food in the Last Year: Not on file  Transportation Needs:   . Lack of Transportation (Medical): Not on file  . Lack of Transportation (Non-Medical): Not on file  Physical Activity:   . Days of Exercise per Week: Not on file  . Minutes of Exercise per Session: Not on file  Stress:   . Feeling of Stress : Not on file  Social Connections:   . Frequency of Communication with Friends and Family: Not on file  . Frequency of Social Gatherings with Friends and Family: Not on file  . Attends Religious Services: Not on file  . Active Member of Clubs or Organizations: Not on file  . Attends Archivist Meetings: Not on file  . Marital Status: Not on file  Intimate Partner Violence:   . Fear of Current or Ex-Partner: Not on file  . Emotionally Abused: Not on file  . Physically Abused: Not on file  . Sexually Abused: Not on file    Review of Systems  Constitutional: Negative.  Negative for chills and fever.  HENT: Negative.  Negative for congestion and sore throat.   Eyes: Negative for blurred vision and double vision.  Respiratory: Negative.  Negative for cough and shortness of breath.   Cardiovascular: Negative.  Negative for chest pain and palpitations.  Gastrointestinal: Positive for diarrhea, nausea and vomiting.  Genitourinary: Negative.  Negative for dysuria and hematuria.  Musculoskeletal: Negative.  Negative for back pain, myalgias and neck pain.  Skin: Negative.  Negative for rash.  Neurological: Positive for headaches. Negative for dizziness, sensory change and focal weakness.  All other systems reviewed and are negative.   Objective  Alert and oriented x3 in no apparent respiratory distress. Vitals as reported by the patient: There were no vitals filed for this visit.  There are no  diagnoses linked to this encounter. Amy Charles was seen today for migraine and nausea.  Diagnoses and all orders for this visit:  Intractable migraine with aura with status migrainosus -     butalbital-acetaminophen-caffeine (FIORICET) 50-325-40 MG tablet; Take 1-2 tablets by mouth every 6 (six) hours as needed for headache. -     Ambulatory referral to Neurology     I discussed the assessment and treatment plan with the patient. The patient was provided an opportunity to ask questions and all were answered. The patient agreed with the plan and demonstrated an understanding of the instructions.   The patient was advised to call back or seek an in-person evaluation if the symptoms worsen or if the condition fails to improve as anticipated.  I provided 20 minutes of non-face-to-face time during  this encounter.  Horald Pollen, MD  Primary Care at [facility]

## 2020-10-02 ENCOUNTER — Ambulatory Visit (HOSPITAL_COMMUNITY)
Admission: AD | Admit: 2020-10-02 | Discharge: 2020-10-02 | Disposition: A | Discharge status: Home / Self Care | Payer: BC Managed Care – PPO | Source: Other Acute Inpatient Hospital | Attending: Emergency Medicine | Admitting: Emergency Medicine

## 2020-10-02 ENCOUNTER — Emergency Department: Payer: BC Managed Care – PPO

## 2020-10-02 ENCOUNTER — Emergency Department
Admission: EM | Admit: 2020-10-02 | Discharge: 2020-10-02 | Disposition: A | Discharge status: Home / Self Care | Payer: BC Managed Care – PPO | Attending: Emergency Medicine | Admitting: Emergency Medicine

## 2020-10-02 DIAGNOSIS — I10 Essential (primary) hypertension: Secondary | ICD-10-CM | POA: Diagnosis not present

## 2020-10-02 DIAGNOSIS — I720 Aneurysm of carotid artery: Secondary | ICD-10-CM | POA: Insufficient documentation

## 2020-10-02 DIAGNOSIS — Z85828 Personal history of other malignant neoplasm of skin: Secondary | ICD-10-CM | POA: Diagnosis not present

## 2020-10-02 DIAGNOSIS — F17210 Nicotine dependence, cigarettes, uncomplicated: Secondary | ICD-10-CM | POA: Diagnosis not present

## 2020-10-02 DIAGNOSIS — Z20822 Contact with and (suspected) exposure to covid-19: Secondary | ICD-10-CM | POA: Insufficient documentation

## 2020-10-02 DIAGNOSIS — I671 Cerebral aneurysm, nonruptured: Secondary | ICD-10-CM | POA: Diagnosis not present

## 2020-10-02 DIAGNOSIS — Z79899 Other long term (current) drug therapy: Secondary | ICD-10-CM | POA: Diagnosis not present

## 2020-10-02 DIAGNOSIS — R42 Dizziness and giddiness: Secondary | ICD-10-CM | POA: Diagnosis not present

## 2020-10-02 DIAGNOSIS — R519 Headache, unspecified: Secondary | ICD-10-CM | POA: Insufficient documentation

## 2020-10-02 DIAGNOSIS — I7771 Dissection of carotid artery: Secondary | ICD-10-CM | POA: Diagnosis not present

## 2020-10-02 DIAGNOSIS — M542 Cervicalgia: Secondary | ICD-10-CM | POA: Diagnosis not present

## 2020-10-02 DIAGNOSIS — F419 Anxiety disorder, unspecified: Secondary | ICD-10-CM | POA: Diagnosis not present

## 2020-10-02 DIAGNOSIS — G43909 Migraine, unspecified, not intractable, without status migrainosus: Secondary | ICD-10-CM | POA: Diagnosis not present

## 2020-10-02 LAB — CBC WITH DIFFERENTIAL/PLATELET
Abs Immature Granulocytes: 0.03 10*3/uL (ref 0.00–0.07)
Basophils Absolute: 0.1 10*3/uL (ref 0.0–0.1)
Basophils Relative: 1 %
Eosinophils Absolute: 0.4 10*3/uL (ref 0.0–0.5)
Eosinophils Relative: 5 %
HCT: 41.9 % (ref 36.0–46.0)
Hemoglobin: 14.1 g/dL (ref 12.0–15.0)
Immature Granulocytes: 0 %
Lymphocytes Relative: 40 %
Lymphs Abs: 3.2 10*3/uL (ref 0.7–4.0)
MCH: 30.3 pg (ref 26.0–34.0)
MCHC: 33.7 g/dL (ref 30.0–36.0)
MCV: 89.9 fL (ref 80.0–100.0)
Monocytes Absolute: 0.5 10*3/uL (ref 0.1–1.0)
Monocytes Relative: 6 %
Neutro Abs: 3.8 10*3/uL (ref 1.7–7.7)
Neutrophils Relative %: 48 %
Platelets: 251 10*3/uL (ref 150–400)
RBC: 4.66 MIL/uL (ref 3.87–5.11)
RDW: 13.8 % (ref 11.5–15.5)
WBC: 8 10*3/uL (ref 4.0–10.5)
nRBC: 0 % (ref 0.0–0.2)

## 2020-10-02 LAB — COMPREHENSIVE METABOLIC PANEL
ALT: 35 U/L (ref 0–44)
AST: 20 U/L (ref 15–41)
Albumin: 4.3 g/dL (ref 3.5–5.0)
Alkaline Phosphatase: 75 U/L (ref 38–126)
Anion gap: 9 (ref 5–15)
BUN: 17 mg/dL (ref 6–20)
CO2: 26 mmol/L (ref 22–32)
Calcium: 9.2 mg/dL (ref 8.9–10.3)
Chloride: 105 mmol/L (ref 98–111)
Creatinine, Ser: 0.73 mg/dL (ref 0.44–1.00)
GFR, Estimated: >60 mL/min (ref >60)
Glucose, Bld: 111 mg/dL — ABNORMAL HIGH (ref 70–99)
Potassium: 4 mmol/L (ref 3.5–5.1)
Sodium: 140 mmol/L (ref 135–145)
Total Bilirubin: 0.6 mg/dL (ref 0.3–1.2)
Total Protein: 7 g/dL (ref 6.5–8.1)

## 2020-10-02 LAB — RESPIRATORY PANEL BY RT PCR (FLU A&B, COVID)
Influenza A by PCR: NEGATIVE
Influenza B by PCR: NEGATIVE
SARS Coronavirus 2 by RT PCR: NEGATIVE

## 2020-10-02 MED ORDER — SODIUM CHLORIDE 0.9 % IV BOLUS
1000.0000 mL | Freq: Once | INTRAVENOUS | Status: AC
  Administered 2020-10-02: 1000 mL via INTRAVENOUS

## 2020-10-02 MED ORDER — ONDANSETRON HCL 4 MG/2ML IJ SOLN
4.0000 mg | Freq: Once | INTRAMUSCULAR | Status: AC
  Administered 2020-10-02: 4 mg via INTRAVENOUS
  Filled 2020-10-02: qty 2

## 2020-10-02 MED ORDER — MORPHINE SULFATE (PF) 4 MG/ML IV SOLN
4.0000 mg | Freq: Once | INTRAVENOUS | Status: AC
  Administered 2020-10-02: 4 mg via INTRAVENOUS
  Filled 2020-10-02: qty 1

## 2020-10-02 MED ORDER — IOHEXOL 350 MG/ML SOLN
75.0000 mL | Freq: Once | INTRAVENOUS | Status: AC | PRN
  Administered 2020-10-02: 75 mL via INTRAVENOUS
  Filled 2020-10-02: qty 75

## 2020-10-02 MED ORDER — NICOTINE 7 MG/24HR TD PT24
7.0000 mg | MEDICATED_PATCH | Freq: Once | TRANSDERMAL | Status: DC
  Filled 2020-10-02: qty 1

## 2020-10-02 NOTE — ED Notes (Signed)
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

## 2020-10-02 NOTE — ED Notes (Signed)
Accepted by [facility] neurosurgery  ED to ED [phone]

## 2020-10-02 NOTE — ED Provider Notes (Signed)
[facility] Emergency Department Provider Note  ____________________________________________  Time seen: Approximately 12:35 PM  I have reviewed the triage vital signs and the nursing notes.   HISTORY  Chief Complaint Migraine    HPI Amy Charles is a 49 y.o. female who presented to the emergency department complaining of worsening headache.  Patient states that she started with a new daily persistent headache 3 months ago.  She has a history of migraines, complex headaches and was evaluated by her primary care provider.  They attempted abortive medications which seemed to improve symptoms somewhat but she continued with a persistent headache.  She states that the medications would alleviate some of the symptoms but not fully eliminate headache.  Patient has seen neurology in the past for these complex migraines, has had MRI and CT scans without acute findings.  The last imaging was reportedly in 2019 which was an MRI that I was able to visualize in epic.  At that time there was nonspecific white matter changes but no acute findings.  Patient states that for the majority of the time she has been able to keep symptoms manageable with the medications prescribed by her primary care provider.  She states that over the last 2 weeks however the headache has worsened and worsened to the point that she is unable to tolerate it and receive any relief with medications.  Currently she is taking Fioricet for headache without any relief.  She states that the pain does radiate into the posterior neck.  She denies any loss of consciousness.  She states that it is the worst headache she has ever had.  At this point she states that she cannot get relief from any medication or any other alternative treatment.  Patient reports that she has significant photophobia, tunnellike vision, ringing in her ears constantly, nausea and vomiting.  She describes this as the worst headache she has ever had.   This was not a thunderclap start, it has been progressively worsening over the 3 months.  There is no weakness reported on either side of the body.  No difficulty formulating thoughts or words.  Patient has never had a CVA or TIA.  History of anxiety, GERD, irritable bowel syndrome, complex headaches.         Past Medical History:  Diagnosis Date  . Allergy   . Anxiety   . Cancer ([place])   . Depression   . Gallstones   . GERD (gastroesophageal reflux disease)   . IBS (irritable bowel syndrome)   . Migraines     Patient Active Problem List   Diagnosis Date Noted  . 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 Chronic diarrhea 07/18/2018  . Situational anxiety 07/18/2018  . Essential hypertension 05/14/2018  . Generalized anxiety disorder 05/14/2018  . Chronic daily headache 05/18/2012    Past Surgical History:  Procedure Laterality Date  . BREAST BIOPSY    . CHOLECYSTECTOMY N/A 12/25/2017   Procedure: LAPAROSCOPIC CHOLECYSTECTOMY;  Surgeon: Olean Ree, MD;  Location: [facility] ORS;  Service: General;  Laterality: N/A;  . NOVASURE ABLATION    . TUBAL LIGATION      Prior to Admission medications   Medication Sig Start Date End Date Taking? Authorizing Provider  ALPRAZolam Duanne Moron) 1 MG tablet Take 0.5 tablets (0.5 mg total) by mouth daily as needed for anxiety. 07/07/20   Horald Pollen, MD  butalbital-acetaminophen-caffeine (FIORICET) [phone] MG tablet Take 1-2 tablets by mouth every 6 (six)  hours as needed for headache. 08/24/20 08/24/21  Horald Pollen, MD  eletriptan (RELPAX) 20 MG tablet Take 1 tablet (20 mg total) by mouth as needed for migraine or headache. May repeat in 2 hours if headache persists or recurs. 07/07/20   Horald Pollen, MD  escitalopram (LEXAPRO) 10 MG tablet Take 1 tablet (10 mg total) by mouth daily. 07/07/20 10/05/20  Horald Pollen, MD  levocetirizine (XYZAL) 5 MG tablet Take 1 tablet (5  mg total) by mouth every evening. 06/19/18   Jaynee Eagles, PA-C  lisinopril (ZESTRIL) 40 MG tablet Take 1 tablet (40 mg total) by mouth daily. 07/20/20   Horald Pollen, MD  varenicline (CHANTIX STARTING MONTH PAK) 0.5 MG X 11 & 1 MG X 42 tablet Take one 0.5 mg tablet by mouth once daily for 3 days, then increase to one 0.5 mg tablet twice daily for 4 days, then increase to one 1 mg tablet twice daily. Patient not taking: Reported on 08/24/2020 10/15/19   Horald Pollen, MD    Allergies Augmentin [amoxicillin-pot clavulanate], Sumatriptan, and Tetracyclines & related  Family History  Problem Relation Age of Onset  . Diabetes Mother   . Heart disease Mother   . Hyperlipidemia Mother   . Hypertension Mother   . Heart disease Father   . Hyperlipidemia Father   . Hypertension Father   . Cancer Father   . Hyperlipidemia Brother   . Hypertension Brother   . Hyperlipidemia Brother   . Hypertension Brother     Social History Social History   Tobacco Use  . Smoking status: Current Every Day Smoker    Packs/day: 0.50    Years: 30.00    Pack years: 15.00    Types: Cigarettes  . Smokeless tobacco: Never Used  . Tobacco comment: off/on  Substance Use Topics  . Alcohol use: No  . Drug use: No     Review of Systems  Constitutional: No fever/chills Eyes: No visual changes. No discharge ENT: No upper respiratory complaints. Cardiovascular: no chest pain. Respiratory: no cough. No SOB. Gastrointestinal: No abdominal pain.  No nausea, no vomiting.  No diarrhea.  No constipation. Genitourinary: Negative for dysuria. No hematuria Musculoskeletal: Negative for musculoskeletal pain. Skin: Negative for rash, abrasions, lacerations, ecchymosis. Neurological: New daily persistent headache.  Tunnel vision, generalized weakness, tinnitus.  Denies focal weakness or numbness.  10 System ROS otherwise negative.  ____________________________________________   PHYSICAL  EXAM:  VITAL SIGNS: ED Triage Vitals  Enc Vitals Group     BP 10/02/20 1207 (!) 121/56     Pulse Rate 10/02/20 1207 (!) 48     Resp 10/02/20 1207 18     Temp 10/02/20 1207 97.7 F (36.5 C)     Temp Source 10/02/20 1207 Oral     SpO2 10/02/20 1207 100 %     Weight 10/02/20 1204 210 lb (95.3 kg)     Height 10/02/20 1204 5\' 5"  (1.651 m)     Head Circumference --      Peak Flow --      Pain Score 10/02/20 1204 10     Pain Loc --      Pain Edu? --      Excl. in [place]? --      Constitutional: Alert and oriented. Well appearing and in no acute distress. Eyes: Conjunctivae are normal. PERRL. EOMI. Head: Atraumatic. ENT:      Ears:       Nose: No congestion/rhinnorhea.  Mouth/Throat: Mucous membranes are moist.  Neck: No stridor.  Patient has mild diffuse tenderness throughout the posterior cervical spine including bilateral paraspinal muscle groups.  There is no tenderness extending into the lateral or anterior aspect of the neck.  No edema or erythema.  Radial pulse and sensation intact and equal bilateral upper extremities.  Cardiovascular: Normal rate, regular rhythm. Normal S1 and S2.  Good peripheral circulation. Respiratory: Normal respiratory effort without tachypnea or retractions. Lungs CTAB. Good air entry to the bases with no decreased or absent breath sounds. Gastrointestinal: Bowel sounds 4 quadrants. Soft and nontender to palpation. No guarding or rigidity. No palpable masses. No distention. No CVA tenderness. Musculoskeletal: Full range of motion to all extremities. No gross deformities appreciated. Neurologic:  Normal speech and language. No gross focal neurologic deficits are appreciated.  Cranial nerve testing reveals cranial nerves II through XII grossly intact.  Negative Romberg's and pronator drift.  Equal grip strength bilateral upper extremities.  Patient is able to approximate all digits appropriately.  Patient is observed walking with no foot drop.  No drift  while walking. Skin:  Skin is warm, dry and intact. No rash noted. Psychiatric: Mood and affect are normal. Speech and behavior are normal. Patient exhibits appropriate insight and judgement.   ____________________________________________   LABS (all labs ordered are listed, but only abnormal results are displayed)  Labs Reviewed  COMPREHENSIVE METABOLIC PANEL - Abnormal; Notable for the following components:      Result Value   Glucose, Bld 111 (*)    All other components within normal limits  RESPIRATORY PANEL BY RT PCR (FLU A&B, COVID)  CBC WITH DIFFERENTIAL/PLATELET   ____________________________________________  EKG   ____________________________________________  RADIOLOGY I personally viewed and evaluated these images as part of my medical decision making, as well as reviewing the written report by the radiologist.  ED Provider Interpretation: I discussed imaging results with radiologist.  There is a 3 mm aneurysm in the left distal ICA transitioning into dissection.  Inferior to this there is a 7 mm dilation also in the distal left cervical ICA.  No evidence of intracranial hemorrhage.  CT Angio Head W or Wo Contrast  Result Date: 10/02/2020 CLINICAL DATA:  New daily persistent headache for 3 months, history of migraines, different pattern accompanied by neck pain, headache no longer responsive to medications. EXAM: CT ANGIOGRAPHY HEAD AND NECK TECHNIQUE: Multidetector CT imaging of the head and neck was performed using the standard protocol during bolus administration of intravenous contrast. Multiplanar CT image reconstructions and MIPs were obtained to evaluate the vascular anatomy. Carotid stenosis measurements (when applicable) are obtained utilizing NASCET criteria, using the distal internal carotid diameter as the denominator. CONTRAST:  40mL OMNIPAQUE IOHEXOL 350 MG/ML SOLN COMPARISON:  Brain MRI 05/17/2018. FINDINGS: CT HEAD FINDINGS Brain: Cerebral volume is normal  for age. There is no acute intracranial hemorrhage. No demarcated cortical infarct. No extra-axial fluid collection. No evidence of intracranial mass. No midline shift. Vascular: Reported below. Skull: Normal. Negative for fracture or focal lesion. Sinuses: No significant paranasal sinus disease or mastoid effusion. Orbits: No mass or acute finding. Review of the MIP images confirms the above findings CTA NECK FINDINGS Aortic arch: Common origin of the innominate and left common carotid arteries. The visualized aortic arch is otherwise unremarkable. No hemodynamically significant innominate or proximal subclavian artery stenosis. Right carotid system: CCA and ICA patent within the neck without stenosis. No significant atherosclerotic disease. Left carotid system: CCA and ICA patent within the neck  without stenosis. There is fusiform dilation of the distal cervical left ICA measuring up to 7 mm in diameter. Also at this site, there is a small intimal flap and 3 mm inferomedially projecting vascular protrusion (for instance as seen on series 510, image 108) (series 509, image 208). Vertebral arteries: Codominant and patent within the neck without stenosis. Skeleton: Cervical spondylosis. No acute bony abnormality or aggressive osseous lesion. Other neck: No neck mass or cervical lymphadenopathy. Upper chest: No consolidation within the imaged lung apices. Paraseptal emphysema. Review of the MIP images confirms the above findings CTA HEAD FINDINGS Anterior circulation: The intracranial internal carotid arteries are patent. Minimal calcified plaque on the left without stenosis. The M1 middle cerebral arteries are patent without significant stenosis. No M2 proximal branch occlusion or high-grade proximal stenosis is identified. The anterior cerebral arteries are patent. No intracranial aneurysm is identified. Posterior circulation: The intracranial vertebral arteries are patent. The basilar artery is patent. Fetal origin  left posterior cerebral artery. The posterior cerebral arteries are patent. A right posterior communicating artery is present. Venous sinuses: Within limitations of contrast timing, no convincing thrombus. Anatomic variants: As described Review of the MIP images confirms the above findings These results were called by telephone at the time of interpretation on 10/02/2020 at 3:14 pm to provider [facility] , who verbally acknowledged these results. IMPRESSION: CT head: No evidence of acute intracranial abnormality. CTA head: 1. Findings consistent with age-indeterminate dissection of the distal cervical left ICA. Associated aneurysm at this site which is predominantly fusiform, but also with a 3 mm saccular component. No significant vessel stenosis. 2. The right common and internal carotid arteries, and bilateral vertebral arteries, are patent within the neck without significant stenosis. CTA neck: 1. No intracranial large vessel occlusion or proximal high-grade arterial stenosis. 2. Minimal non-stenotic calcified plaque within the intracranial left ICA. Electronically Signed   By: Kellie Simmering DO   On: 10/02/2020 15:18   CT Angio Neck W and/or Wo Contrast  Result Date: 10/02/2020 CLINICAL DATA:  New daily persistent headache for 3 months, history of migraines, different pattern accompanied by neck pain, headache no longer responsive to medications. EXAM: CT ANGIOGRAPHY HEAD AND NECK TECHNIQUE: Multidetector CT imaging of the head and neck was performed using the standard protocol during bolus administration of intravenous contrast. Multiplanar CT image reconstructions and MIPs were obtained to evaluate the vascular anatomy. Carotid stenosis measurements (when applicable) are obtained utilizing NASCET criteria, using the distal internal carotid diameter as the denominator. CONTRAST:  74mL OMNIPAQUE IOHEXOL 350 MG/ML SOLN COMPARISON:  Brain MRI 05/17/2018. FINDINGS: CT HEAD FINDINGS Brain: Cerebral volume is  normal for age. There is no acute intracranial hemorrhage. No demarcated cortical infarct. No extra-axial fluid collection. No evidence of intracranial mass. No midline shift. Vascular: Reported below. Skull: Normal. Negative for fracture or focal lesion. Sinuses: No significant paranasal sinus disease or mastoid effusion. Orbits: No mass or acute finding. Review of the MIP images confirms the above findings CTA NECK FINDINGS Aortic arch: Common origin of the innominate and left common carotid arteries. The visualized aortic arch is otherwise unremarkable. No hemodynamically significant innominate or proximal subclavian artery stenosis. Right carotid system: CCA and ICA patent within the neck without stenosis. No significant atherosclerotic disease. Left carotid system: CCA and ICA patent within the neck without stenosis. There is fusiform dilation of the distal cervical left ICA measuring up to 7 mm in diameter. Also at this site, there is a small intimal flap and 3  mm inferomedially projecting vascular protrusion (for instance as seen on series 510, image 108) (series 509, image 208). Vertebral arteries: Codominant and patent within the neck without stenosis. Skeleton: Cervical spondylosis. No acute bony abnormality or aggressive osseous lesion. Other neck: No neck mass or cervical lymphadenopathy. Upper chest: No consolidation within the imaged lung apices. Paraseptal emphysema. Review of the MIP images confirms the above findings CTA HEAD FINDINGS Anterior circulation: The intracranial internal carotid arteries are patent. Minimal calcified plaque on the left without stenosis. The M1 middle cerebral arteries are patent without significant stenosis. No M2 proximal branch occlusion or high-grade proximal stenosis is identified. The anterior cerebral arteries are patent. No intracranial aneurysm is identified. Posterior circulation: The intracranial vertebral arteries are patent. The basilar artery is patent. Fetal  origin left posterior cerebral artery. The posterior cerebral arteries are patent. A right posterior communicating artery is present. Venous sinuses: Within limitations of contrast timing, no convincing thrombus. Anatomic variants: As described Review of the MIP images confirms the above findings These results were called by telephone at the time of interpretation on 10/02/2020 at 3:14 pm to provider [facility] , who verbally acknowledged these results. IMPRESSION: CT head: No evidence of acute intracranial abnormality. CTA head: 1. Findings consistent with age-indeterminate dissection of the distal cervical left ICA. Associated aneurysm at this site which is predominantly fusiform, but also with a 3 mm saccular component. No significant vessel stenosis. 2. The right common and internal carotid arteries, and bilateral vertebral arteries, are patent within the neck without significant stenosis. CTA neck: 1. No intracranial large vessel occlusion or proximal high-grade arterial stenosis. 2. Minimal non-stenotic calcified plaque within the intracranial left ICA. Electronically Signed   By: Kellie Simmering DO   On: 10/02/2020 15:18    ____________________________________________    PROCEDURES  Procedure(s) performed:    .Critical Care Performed by: Darletta Moll, PA-C Authorized by: Darletta Moll, PA-C   Critical care provider statement:    Critical care time (minutes):  40   Critical care time was exclusive of:  Separately billable procedures and treating other patients and teaching time   Critical care was necessary to treat or prevent imminent or life-threatening deterioration of the following conditions:  CNS failure or compromise   Critical care was time spent personally by me on the following activities:  Obtaining history from patient or surrogate, examination of patient, development of treatment plan with patient or surrogate, discussions with consultants, ordering and  performing treatments and interventions, ordering and review of laboratory studies, ordering and review of radiographic studies, re-evaluation of patient's condition and review of old charts Comments:     Patient presented to the emergency department with acutely worsening new daily persistent headache.  Patient has had 3 months of symptoms but states that the last 2 weeks and specifically the last week of symptoms has been unbearable.  She tried multiple over-the-counter as well as prescription therapies for her headache.  She does have a history of complex migraines and complex headache syndrome.  Given the description of the worst headache of her life, tunnel vision, tinnitus, nausea vomiting, generalized weakness even with a reassuring neuro exam, I felt that imaging was warranted.  Imaging reveals findings in the left internal carotid artery.  There is a 7 mm aneurysm, superior to this there is a 3 mm aneurysm leading into a dissection.  There is no surrounding hemorrhage identified on imaging.  I discussed the case with radiology regarding results of imaging.  Then discussed the case with neurosurgery who advises that this presentation warrants emergent transfer for neurovascular surgery.  Our neurosurgery team advised transfer to [facility] system.  I have reached out to [facility] for transfer.      Medications  nicotine (NICODERM CQ - dosed in mg/24 hr) patch 7 mg (7 mg Transdermal Refused 10/02/20 1602)  sodium chloride 0.9 % bolus 1,000 mL (0 mLs Intravenous Stopped 10/02/20 1638)  iohexol (OMNIPAQUE) 350 MG/ML injection 75 mL (75 mLs Intravenous Contrast Given 10/02/20 1424)  morphine 4 MG/ML injection 4 mg (4 mg Intravenous Given 10/02/20 1746)  ondansetron (ZOFRAN) injection 4 mg (4 mg Intravenous Given 10/02/20 1746)     ____________________________________________   INITIAL IMPRESSION / ASSESSMENT AND PLAN / ED COURSE  Pertinent labs & imaging results that were available  during my care of the patient were reviewed by me and considered in my medical decision making (see chart for details).  Review of the [place] CSRS was performed in accordance of the [place] prior to dispensing any controlled drugs.           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  Patient presented to the emergency department with 17-month history of a new, daily persistent headache.  Patient states that this is the worst headache she has ever had in her life but it was not a thunderclap start.  Patient states that the symptoms have drastically worsened over the last 2 weeks with the last week of symptoms being unbearable.  Patient states that she has a long history of migraines and complex headache syndrome.  She has been seen by neurology as well as primary care in the past for these complaints.  Patient had CT scans and MRIs of her head in the past for these complex migraines without any acute findings.  I was able to visualize the MRI from 2019 which revealed subtle white matter changes without other significant abnormality.  Given the progressive nature, description of the worst headache she has ever had even though patient had a reassuring neuro exam in the emergency department, I felt that imaging was warranted.  CT angio head and neck was performed with findings consistent of 2 aneurysmal components with dissection of the left ICA.  Superior aneurysm is 3 mm leading into dissection component.  Inferior to this there is a 7 mm aneurysmal area.  I discussed the case with our on-call neurosurgeon, Dr Aris Lot.  Dr. Aris Lot advises that given the 2 aneurysmal components with 1 just superior to the dissection, this needed emergent transfer to [facility] for neurovascular surgery.  Patient is still complaining of a significant headache at this time but is still neurologically intact with exam..  There has been no medications administered  for headache pending imaging results.  At this time patient will be moved to our major side of the emergency department for closer monitoring.  I have reached out to [facility] neurovascular surgery for transfer at this time.  ----------------------------------------- 4:30 PM on 10/02/2020 -----------------------------------------  Discussed the case with neurosurgeon, Dr. Francesca Oman with [facility].  [facility] will accept the patient in ED to ED transfer.  Advised that they would perform further diagnostics including lumbar puncture at [facility] to figure out best surgical approach.     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 Despite best efforts to proofread, errors can occur which can change the meaning. Any change was purely unintentional.    Darletta Moll, PA-C 10/02/20 1814    Lavonia Drafts, MD 10/02/20 1932

## 2020-10-02 NOTE — ED Triage Notes (Signed)
Patient to ED for migraine. Sent from [place]. States she has medications for migraine but they really don't work. States the last 8-10 days she hasn't been able to work due to pain. Photo and phonosensitivity.

## 2020-10-02 NOTE — ED Notes (Signed)
EMTALA reviewed by this RN.  

## 2020-10-02 NOTE — ED Notes (Signed)
Pt states that the nicotine patch does not work for her and she declined

## 2020-10-02 NOTE — ED Notes (Signed)
Called Waunita Schooner from [employer] for [employer]

## 2020-10-02 NOTE — ED Notes (Signed)
Care transferred to [facility]

## 2020-10-03 DIAGNOSIS — I7771 Dissection of carotid artery: Secondary | ICD-10-CM | POA: Diagnosis not present

## 2020-10-03 DIAGNOSIS — G43011 Migraine without aura, intractable, with status migrainosus: Secondary | ICD-10-CM | POA: Diagnosis not present

## 2020-10-04 ENCOUNTER — Other Ambulatory Visit: Payer: Self-pay | Admitting: Emergency Medicine

## 2020-10-04 DIAGNOSIS — I10 Essential (primary) hypertension: Secondary | ICD-10-CM

## 2020-10-10 ENCOUNTER — Ambulatory Visit: Payer: BC Managed Care – PPO | Admitting: Emergency Medicine

## 2020-10-11 ENCOUNTER — Ambulatory Visit: Payer: BC Managed Care – PPO | Admitting: Emergency Medicine

## 2020-10-12 ENCOUNTER — Ambulatory Visit (INDEPENDENT_AMBULATORY_CARE_PROVIDER_SITE_OTHER): Payer: BC Managed Care – PPO | Admitting: Emergency Medicine

## 2020-10-12 ENCOUNTER — Telehealth: Payer: Self-pay | Admitting: *Deleted

## 2020-10-12 ENCOUNTER — Encounter: Payer: Self-pay | Admitting: Emergency Medicine

## 2020-10-12 ENCOUNTER — Other Ambulatory Visit: Payer: Self-pay

## 2020-10-12 VITALS — BP 126/71 | HR 60 | Temp 98.2°F | Resp 16 | Ht 65.0 in | Wt 204.0 lb

## 2020-10-12 DIAGNOSIS — Z09 Encounter for follow-up examination after completed treatment for conditions other than malignant neoplasm: Secondary | ICD-10-CM

## 2020-10-12 DIAGNOSIS — I10 Essential (primary) hypertension: Secondary | ICD-10-CM | POA: Diagnosis not present

## 2020-10-12 DIAGNOSIS — G43111 Migraine with aura, intractable, with status migrainosus: Secondary | ICD-10-CM | POA: Diagnosis not present

## 2020-10-12 DIAGNOSIS — F418 Other specified anxiety disorders: Secondary | ICD-10-CM | POA: Diagnosis not present

## 2020-10-12 DIAGNOSIS — Z8679 Personal history of other diseases of the circulatory system: Secondary | ICD-10-CM

## 2020-10-12 DIAGNOSIS — Z6833 Body mass index (BMI) 33.0-33.9, adult: Secondary | ICD-10-CM

## 2020-10-12 DIAGNOSIS — G43719 Chronic migraine without aura, intractable, without status migrainosus: Secondary | ICD-10-CM | POA: Diagnosis not present

## 2020-10-12 MED ORDER — BUTALBITAL-APAP-CAFFEINE 50-325-40 MG PO TABS: 1.0000 | ORAL_TABLET | Freq: Four times a day (QID) | ORAL | 1 refills | Status: DC | PRN

## 2020-10-12 MED ORDER — LISINOPRIL 40 MG PO TABS: 40.0000 mg | ORAL_TABLET | Freq: Every day | ORAL | 3 refills | Status: DC

## 2020-10-12 MED ORDER — ALPRAZOLAM 1 MG PO TABS: 0.5000 mg | ORAL_TABLET | Freq: Every day | ORAL | 1 refills | Status: DC | PRN

## 2020-10-12 MED ORDER — ELETRIPTAN HYDROBROMIDE 20 MG PO TABS: 20.0000 mg | ORAL_TABLET | ORAL | 2 refills | Status: DC | PRN

## 2020-10-12 NOTE — Patient Instructions (Addendum)
   If you have lab work done today you will be contacted with your lab results within the next 2 weeks.  If you have not heard from us then please contact us. The fastest way to get your results is to register for My Chart.   IF you received an x-ray today, you will receive an invoice from [facility]. Please contact [facility] at [phone] with questions or concerns regarding your invoice.   IF you received labwork today, you will receive an invoice from [facility]. Please contact [facility] at [phone] with questions or concerns regarding your invoice.   Our billing staff will not be able to assist you with questions regarding bills from these companies.  You will be contacted with the lab results as soon as they are available. The fastest way to get your results is to activate your My Chart account. Instructions are located on the last page of this paperwork. If you have not heard from us regarding the results in 2 weeks, please contact this office.       Health Maintenance, Female Adopting a healthy lifestyle and getting preventive care are important in promoting health and wellness. Ask your health care provider about:  The right schedule for you to have regular tests and exams.  Things you can do on your own to prevent diseases and keep yourself healthy. What should I know about diet, weight, and exercise? Eat a healthy diet   Eat a diet that includes plenty of vegetables, fruits, low-fat dairy products, and lean protein.  Do not eat a lot of foods that are high in solid fats, added sugars, or sodium. Maintain a healthy weight Body mass index (BMI) is used to identify weight problems. It estimates body fat based on height and weight. Your health care provider can help determine your BMI and help you achieve or maintain a healthy weight. Get regular exercise Get regular exercise. This is one of the most important things you can do for your health. Most  adults should:  Exercise for at least 150 minutes each week. The exercise should increase your heart rate and make you sweat (moderate-intensity exercise).  Do strengthening exercises at least twice a week. This is in addition to the moderate-intensity exercise.  Spend less time sitting. Even light physical activity can be beneficial. Watch cholesterol and blood lipids Have your blood tested for lipids and cholesterol at 48 years of age, then have this test every 5 years. Have your cholesterol levels checked more often if:  Your lipid or cholesterol levels are high.  You are older than 48 years of age.  You are at high risk for heart disease. What should I know about cancer screening? Depending on your health history and family history, you may need to have cancer screening at various ages. This may include screening for:  Breast cancer.  Cervical cancer.  Colorectal cancer.  Skin cancer.  Lung cancer. What should I know about heart disease, diabetes, and high blood pressure? Blood pressure and heart disease  High blood pressure causes heart disease and increases the risk of stroke. This is more likely to develop in people who have high blood pressure readings, are of African descent, or are overweight.  Have your blood pressure checked: ? Every 3-5 years if you are 18-39 years of age. ? Every year if you are 40 years old or older. Diabetes Have regular diabetes screenings. This checks your fasting blood sugar level. Have the screening done:  Once   every three years after age 40 if you are at a normal weight and have a low risk for diabetes.  More often and at a younger age if you are overweight or have a high risk for diabetes. What should I know about preventing infection? Hepatitis B If you have a higher risk for hepatitis B, you should be screened for this virus. Talk with your health care provider to find out if you are at risk for hepatitis B infection. Hepatitis  C Testing is recommended for:  Everyone born from 1945 through 1965.  Anyone with known risk factors for hepatitis C. Sexually transmitted infections (STIs)  Get screened for STIs, including gonorrhea and chlamydia, if: ? You are sexually active and are younger than 48 years of age. ? You are older than 48 years of age and your health care provider tells you that you are at risk for this type of infection. ? Your sexual activity has changed since you were last screened, and you are at increased risk for chlamydia or gonorrhea. Ask your health care provider if you are at risk.  Ask your health care provider about whether you are at high risk for HIV. Your health care provider may recommend a prescription medicine to help prevent HIV infection. If you choose to take medicine to prevent HIV, you should first get tested for HIV. You should then be tested every 3 months for as long as you are taking the medicine. Pregnancy  If you are about to stop having your period (premenopausal) and you may become pregnant, seek counseling before you get pregnant.  Take 400 to 800 micrograms (mcg) of folic acid every day if you become pregnant.  Ask for birth control (contraception) if you want to prevent pregnancy. Osteoporosis and menopause Osteoporosis is a disease in which the bones lose minerals and strength with aging. This can result in bone fractures. If you are 65 years old or older, or if you are at risk for osteoporosis and fractures, ask your health care provider if you should:  Be screened for bone loss.  Take a calcium or vitamin D supplement to lower your risk of fractures.  Be given hormone replacement therapy (HRT) to treat symptoms of menopause. Follow these instructions at home: Lifestyle  Do not use any products that contain nicotine or tobacco, such as cigarettes, e-cigarettes, and chewing tobacco. If you need help quitting, ask your health care provider.  Do not use street  drugs.  Do not share needles.  Ask your health care provider for help if you need support or information about quitting drugs. Alcohol use  Do not drink alcohol if: ? Your health care provider tells you not to drink. ? You are pregnant, may be pregnant, or are planning to become pregnant.  If you drink alcohol: ? Limit how much you use to 0-1 drink a day. ? Limit intake if you are breastfeeding.  Be aware of how much alcohol is in your drink. In the [place] one drink equals one 12 oz bottle of beer (355 mL), one 5 oz glass of wine (148 mL), or one 1 oz glass of hard liquor (44 mL). General instructions  Schedule regular health, dental, and eye exams.  Stay current with your vaccines.  Tell your health care provider if: ? You often feel depressed. ? You have ever been abused or do not feel safe at home. Summary  Adopting a healthy lifestyle and getting preventive care are important in promoting health and   wellness.  Follow your health care provider's instructions about healthy diet, exercising, and getting tested or screened for diseases.  Follow your health care provider's instructions on monitoring your cholesterol and blood pressure. This information is not intended to replace advice given to you by your health care provider. Make sure you discuss any questions you have with your health care provider. Document Revised: 11/05/2018 Document Reviewed: 11/05/2018 Elsevier Patient Education  2020 Elsevier Inc.  

## 2020-10-12 NOTE — Progress Notes (Signed)
Amy Charles 49 y.o.   Chief Complaint  Patient presents with  . carotid artery dissection    follow up ED at [facility] and IP at [facility]  . Migraine  . Medication Refill    Pend    HISTORY OF PRESENT ILLNESS: This is a 49 y.o. female here for follow-up of hospital admission and FMLA paperwork. Patient has history of chronic migraine headaches. 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 Seen and evaluated by neurologist and neurosurgery.  No surgical procedure done at the time. Started on Plavix and aspirin. Hospital medical record reviewed.  HPI   Prior to Admission medications   Medication Sig Start Date End Date Taking? Authorizing Provider  ALPRAZolam Duanne Moron) 1 MG tablet Take 0.5 tablets (0.5 mg total) by mouth daily as needed for anxiety. 07/07/20  Yes Ulrick Methot, Ines Bloomer, MD  butalbital-acetaminophen-caffeine [facility]) [phone] MG tablet Take 1-2 tablets by mouth every 6 (six) hours as needed for headache. 08/24/20 08/24/21 Yes Itsel Opfer, Ines Bloomer, MD  eletriptan (RELPAX) 20 MG tablet Take 1 tablet (20 mg total) by mouth as needed for migraine or headache. May repeat in 2 hours if headache persists or recurs. 07/07/20  Yes Ollivander See, Ines Bloomer, MD  levocetirizine (XYZAL) 5 MG tablet Take 1 tablet (5 mg total) by mouth every evening. 06/19/18  Yes Jaynee Eagles, PA-C  lisinopril (ZESTRIL) 40 MG tablet Take 1 tablet (40 mg total) by mouth daily. 07/20/20  Yes Inocencia Murtaugh, Ines Bloomer, MD  escitalopram (LEXAPRO) 10 MG tablet Take 1 tablet (10 mg total) by mouth daily. 07/07/20 10/05/20  Horald Pollen, MD  varenicline (CHANTIX STARTING MONTH PAK) 0.5 MG X 11 & 1 MG X 42 tablet Take one 0.5 mg tablet by mouth once daily for 3 days, then increase to one 0.5 mg tablet twice daily for 4 days, then increase to one 1 mg tablet twice daily. Patient not taking: Reported on 10/12/2020 10/15/19   Horald Pollen, MD    Allergies  Allergen Reactions  . Augmentin [Amoxicillin-Pot Clavulanate] Nausea And Vomiting  . Sumatriptan     convulsions  . Tetracyclines & Related     Patient Active Problem List   Diagnosis Date Noted  . 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 Chronic diarrhea 07/18/2018  . Situational anxiety 07/18/2018  . Essential hypertension 05/14/2018  . Generalized anxiety disorder 05/14/2018  . Chronic daily headache 05/18/2012    Past Medical History:  Diagnosis Date  . Allergy   . Anxiety   . Cancer ([place])   . Depression   . Gallstones   . GERD (gastroesophageal reflux disease)   . IBS (irritable bowel syndrome)   . Migraines     Past Surgical History:  Procedure Laterality Date  . BREAST BIOPSY    . CHOLECYSTECTOMY N/A 12/25/2017   Procedure: LAPAROSCOPIC CHOLECYSTECTOMY;  Surgeon: Olean Ree, MD;  Location: [facility] ORS;  Service: General;  Laterality: N/A;  . NOVASURE ABLATION    . TUBAL LIGATION      Social History   Socioeconomic History  . Marital status: Married    Spouse name: Not on file  . Number of children: Not on file  . Years of education: Not on file  . Highest education level: Not on file  Occupational History  . Not on file  Tobacco Use  . Smoking status: Current Every Day Smoker  Packs/day: 0.50    Years: 30.00    Pack years: 15.00    Types: Cigarettes  . Smokeless tobacco: Never Used  . Tobacco comment: off/on  Substance and Sexual Activity  . Alcohol use: No  . Drug use: No  . Sexual activity: Not on file  Other Topics Concern  . Not on file  Social History Narrative  . Not on file   Social Determinants of Health   Financial Resource Strain:   . Difficulty of Paying Living Expenses: Not on file  Food Insecurity:   . Worried About Charity fundraiser in the Last Year: Not on file  . Ran Out of Food in the Last Year: Not on file  Transportation Needs:   . Lack of  Transportation (Medical): Not on file  . Lack of Transportation (Non-Medical): Not on file  Physical Activity:   . Days of Exercise per Week: Not on file  . Minutes of Exercise per Session: Not on file  Stress:   . Feeling of Stress : Not on file  Social Connections:   . Frequency of Communication with Friends and Family: Not on file  . Frequency of Social Gatherings with Friends and Family: Not on file  . Attends Religious Services: Not on file  . Active Member of Clubs or Organizations: Not on file  . Attends Archivist Meetings: Not on file  . Marital Status: Not on file  Intimate Partner Violence:   . Fear of Current or Ex-Partner: Not on file  . Emotionally Abused: Not on file  . Physically Abused: Not on file  . Sexually Abused: Not on file    Family History  Problem Relation Age of Onset  . Diabetes Mother   . Heart disease Mother   . Hyperlipidemia Mother   . Hypertension Mother   . Heart disease Father   . Hyperlipidemia Father   . Hypertension Father   . Cancer Father   . Hyperlipidemia Brother   . Hypertension Brother   . Hyperlipidemia Brother   . Hypertension Brother      Review of Systems  Constitutional: Negative.  Negative for chills and fever.  HENT: Negative.  Negative for congestion and sore throat.   Respiratory: Negative.  Negative for cough and shortness of breath.   Cardiovascular: Negative.  Negative for chest pain and palpitations.  Gastrointestinal: Negative.  Negative for abdominal pain, diarrhea, nausea and vomiting.  Genitourinary: Negative.  Negative for dysuria.  Musculoskeletal: Negative.  Negative for back pain, myalgias and neck pain.  Skin: Negative.  Negative for rash.  Neurological: Positive for headaches (Chronic migraine headaches). Negative for speech change, focal weakness, seizures and loss of consciousness.  All other systems reviewed and are negative.   Today's Vitals   10/12/20 1109  BP: 126/71  Pulse: 60   Resp: 16  Temp: 98.2 F (36.8 C)  TempSrc: Temporal  SpO2: 96%  Weight: 204 lb (92.5 kg)  Height: 5\' 5"  (1.651 m)   Body mass index is 33.95 kg/m.  Physical Exam Vitals reviewed.  Constitutional:      Appearance: Normal appearance.  HENT:     Head: Normocephalic.  Eyes:     Extraocular Movements: Extraocular movements intact.     Pupils: Pupils are equal, round, and reactive to light.  Cardiovascular:     Rate and Rhythm: Normal rate and regular rhythm.     Pulses: Normal pulses.     Heart sounds: Normal heart sounds.  Pulmonary:  Effort: Pulmonary effort is normal.     Breath sounds: Normal breath sounds.  Musculoskeletal:        General: Normal range of motion.     Cervical back: Normal range of motion. No tenderness.  Skin:    General: Skin is warm and dry.     Capillary Refill: Capillary refill takes less than 2 seconds.  Neurological:     General: No focal deficit present.     Mental Status: She is alert and oriented to person, place, and time.  Psychiatric:        Mood and Affect: Mood normal.        Behavior: Behavior normal.    A total of 30 minutes was spent with the patient, greater than 50% of which was in counseling/coordination of care regarding chronic migraines and recent carotid artery dissection, review of hospital records from recent admission, review of all medications including Plavix and aspirin, fall precautions, FMLA paperwork, prognosis, documentation, and need for follow-up.   ASSESSMENT & PLAN: Clinically stable.  No medical concerns identified during this visit. No side effects from Plavix and aspirin.  Continues to have intermittent migraine headaches. Scheduled to follow-up with neurologist soon. Amy Charles was seen today for carotid artery dissection, migraine and medication refill.  Diagnoses and all orders for this visit:  Essential hypertension -     lisinopril (ZESTRIL) 40 MG tablet; Take 1 tablet (40 mg total) by mouth  daily.  Situational anxiety -     ALPRAZolam (XANAX) 1 MG tablet; Take 0.5 tablets (0.5 mg total) by mouth daily as needed for anxiety.  Intractable migraine with aura with status migrainosus -     butalbital-acetaminophen-caffeine (FIORICET) 50-325-40 MG tablet; Take 1-2 tablets by mouth every 6 (six) hours as needed for headache.  Intractable chronic migraine without aura and without status migrainosus -     eletriptan (RELPAX) 20 MG tablet; Take 1 tablet (20 mg total) by mouth as needed for migraine or headache. May repeat in 2 hours if headache persists or recurs.  History of dissection of internal carotid artery  Hospital discharge follow-up    Patient Instructions       If you have lab work done today you will be contacted with your lab results within the next 2 weeks.  If you have not heard from [place] then please contact us. The fastest way to get your results is to register for My Chart.   IF you received an x-ray today, you will receive an invoice from [facility]. Please contact [facility] at [phone] with questions or concerns regarding your invoice.   IF you received labwork today, you will receive an invoice from [facility]. Please contact [facility] at [phone] with questions or concerns regarding your invoice.   Our billing staff will not be able to assist you with questions regarding bills from these companies.  You will be contacted with the lab results as soon as they are available. The fastest way to get your results is to activate your My Chart account. Instructions are located on the last page of this paperwork. If you have not heard from [place] regarding the results in 2 weeks, please contact this office.     Health Maintenance, Female Adopting a healthy lifestyle and getting preventive care are important in promoting health and wellness. Ask your health care provider about:  The right schedule for you to have regular tests and  exams.  Things you can do on your own to prevent diseases  and keep yourself healthy. What should I know about diet, weight, and exercise? Eat a healthy diet   Eat a diet that includes plenty of vegetables, fruits, low-fat dairy products, and lean protein.  Do not eat a lot of foods that are high in solid fats, added sugars, or sodium. Maintain a healthy weight Body mass index (BMI) is used to identify weight problems. It estimates body fat based on height and weight. Your health care provider can help determine your BMI and help you achieve or maintain a healthy weight. Get regular exercise Get regular exercise. This is one of the most important things you can do for your health. Most adults should:  Exercise for at least 150 minutes each week. The exercise should increase your heart rate and make you sweat (moderate-intensity exercise).  Do strengthening exercises at least twice a week. This is in addition to the moderate-intensity exercise.  Spend less time sitting. Even light physical activity can be beneficial. Watch cholesterol and blood lipids Have your blood tested for lipids and cholesterol at 48 years of age, then have this test every 5 years. Have your cholesterol levels checked more often if:  Your lipid or cholesterol levels are high.  You are older than 48 years of age.  You are at high risk for heart disease. What should I know about cancer screening? Depending on your health history and family history, you may need to have cancer screening at various ages. This may include screening for:  Breast cancer.  Cervical cancer.  Colorectal cancer.  Skin cancer.  Lung cancer. What should I know about heart disease, diabetes, and high blood pressure? Blood pressure and heart disease  High blood pressure causes heart disease and increases the risk of stroke. This is more likely to develop in people who have high blood pressure readings, are of African descent, or are  overweight.  Have your blood pressure checked: ? Every 3-5 years if you are 21-39 years of age. ? Every year if you are 6 years old or older. Diabetes Have regular diabetes screenings. This checks your fasting blood sugar level. Have the screening done:  Once every three years after age 5 if you are at a normal weight and have a low risk for diabetes.  More often and at a younger age if you are overweight or have a high risk for diabetes. What should I know about preventing infection? Hepatitis B If you have a higher risk for hepatitis B, you should be screened for this virus. Talk with your health care provider to find out if you are at risk for hepatitis B infection. Hepatitis C Testing is recommended for:  Everyone born from [birth year] through 1965.  Anyone with known risk factors for hepatitis C. Sexually transmitted infections (STIs)  Get screened for STIs, including gonorrhea and chlamydia, if: ? You are sexually active and are younger than 48 years of age. ? You are older than 48 years of age and your health care provider tells you that you are at risk for this type of infection. ? Your sexual activity has changed since you were last screened, and you are at increased risk for chlamydia or gonorrhea. Ask your health care provider if you are at risk.  Ask your health care provider about whether you are at high risk for HIV. Your health care provider may recommend a prescription medicine to help prevent HIV infection. If you choose to take medicine to prevent HIV, you should first get tested  for HIV. You should then be tested every 3 months for as long as you are taking the medicine. Pregnancy  If you are about to stop having your period (premenopausal) and you may become pregnant, seek counseling before you get pregnant.  Take 400 to 800 micrograms (mcg) of folic acid every day if you become pregnant.  Ask for birth control (contraception) if you want to prevent  pregnancy. Osteoporosis and menopause Osteoporosis is a disease in which the bones lose minerals and strength with aging. This can result in bone fractures. If you are 31 years old or older, or if you are at risk for osteoporosis and fractures, ask your health care provider if you should:  Be screened for bone loss.  Take a calcium or vitamin D supplement to lower your risk of fractures.  Be given hormone replacement therapy (HRT) to treat symptoms of menopause. Follow these instructions at home: Lifestyle  Do not use any products that contain nicotine or tobacco, such as cigarettes, e-cigarettes, and chewing tobacco. If you need help quitting, ask your health care provider.  Do not use street drugs.  Do not share needles.  Ask your health care provider for help if you need support or information about quitting drugs. Alcohol use  Do not drink alcohol if: ? Your health care provider tells you not to drink. ? You are pregnant, may be pregnant, or are planning to become pregnant.  If you drink alcohol: ? Limit how much you use to 0-1 drink a day. ? Limit intake if you are breastfeeding.  Be aware of how much alcohol is in your drink. In the [place] one drink equals one 12 oz bottle of beer (355 mL), one 5 oz glass of wine (148 mL), or one 1 oz glass of hard liquor (44 mL). General instructions  Schedule regular health, dental, and eye exams.  Stay current with your vaccines.  Tell your health care provider if: ? You often feel depressed. ? You have ever been abused or do not feel safe at home. Summary  Adopting a healthy lifestyle and getting preventive care are important in promoting health and wellness.  Follow your health care provider's instructions about healthy diet, exercising, and getting tested or screened for diseases.  Follow your health care provider's instructions on monitoring your cholesterol and blood pressure. This information is not intended to replace  advice given to you by your health care provider. Make sure you discuss any questions you have with your health care provider. Document Revised: 11/05/2018 Document Reviewed: 11/05/2018 Elsevier Patient Education  2020 Elsevier Inc.    Agustina Caroli, MD [facility]

## 2020-10-12 NOTE — Telephone Encounter (Signed)
Faxed FMLA completed paper work to Attn: [employer]. Confirmation page 11:58 am.

## 2020-10-18 ENCOUNTER — Encounter: Payer: Self-pay | Admitting: Emergency Medicine

## 2020-10-19 NOTE — Telephone Encounter (Signed)
Patient states she has been written up at work due to not having the proper paperwork back in time. Do you have this paperwork?

## 2020-10-24 ENCOUNTER — Telehealth: Payer: Self-pay | Admitting: *Deleted

## 2020-10-24 NOTE — Telephone Encounter (Signed)
Spoke to [place] and showed her the paperwork (Disability form) faxed on 10/12/2020 with confirmation page attached. Also, the patient had an appointment the same day. Per Jerene Pitch she will call the patient to let her and confirm the fax.

## 2020-10-24 NOTE — Telephone Encounter (Signed)
Spoke to patient at 10:23 am about the message concerning dates missed work for migraines. I advised patient after I spoke to her on 08/24/2020, I told Dr Mitchel Honour and the addendum for days missed (2) for migraine headache since last office visit 07/20/2020 was re faxed. Per patient she would contact the company and let them know.

## 2020-11-08 ENCOUNTER — Encounter: Payer: Self-pay | Admitting: Emergency Medicine

## 2020-11-08 DIAGNOSIS — G459 Transient cerebral ischemic attack, unspecified: Secondary | ICD-10-CM | POA: Diagnosis not present

## 2020-11-08 DIAGNOSIS — I7771 Dissection of carotid artery: Secondary | ICD-10-CM | POA: Diagnosis not present

## 2020-11-08 DIAGNOSIS — R4789 Other speech disturbances: Secondary | ICD-10-CM | POA: Diagnosis not present

## 2020-11-08 DIAGNOSIS — R519 Headache, unspecified: Secondary | ICD-10-CM | POA: Diagnosis not present

## 2020-11-10 ENCOUNTER — Encounter: Payer: Self-pay | Admitting: Emergency Medicine

## 2020-11-10 ENCOUNTER — Ambulatory Visit (INDEPENDENT_AMBULATORY_CARE_PROVIDER_SITE_OTHER): Payer: BC Managed Care – PPO | Admitting: Emergency Medicine

## 2020-11-10 ENCOUNTER — Other Ambulatory Visit: Payer: Self-pay

## 2020-11-10 ENCOUNTER — Telehealth: Payer: Self-pay | Admitting: *Deleted

## 2020-11-10 VITALS — BP 137/77 | HR 59 | Temp 97.6°F | Resp 16 | Ht 65.0 in | Wt 209.0 lb

## 2020-11-10 DIAGNOSIS — Z8679 Personal history of other diseases of the circulatory system: Secondary | ICD-10-CM | POA: Diagnosis not present

## 2020-11-10 DIAGNOSIS — G4700 Insomnia, unspecified: Secondary | ICD-10-CM | POA: Diagnosis not present

## 2020-11-10 DIAGNOSIS — G43719 Chronic migraine without aura, intractable, without status migrainosus: Secondary | ICD-10-CM

## 2020-11-10 DIAGNOSIS — F17200 Nicotine dependence, unspecified, uncomplicated: Secondary | ICD-10-CM

## 2020-11-10 DIAGNOSIS — I10 Essential (primary) hypertension: Secondary | ICD-10-CM | POA: Diagnosis not present

## 2020-11-10 DIAGNOSIS — F411 Generalized anxiety disorder: Secondary | ICD-10-CM

## 2020-11-10 MED ORDER — ZOLPIDEM TARTRATE 10 MG PO TABS: 10.0000 mg | ORAL_TABLET | Freq: Every evening | ORAL | 1 refills | Status: DC | PRN

## 2020-11-10 NOTE — Progress Notes (Signed)
Amy Charles 49 y.o.   Chief Complaint  Patient presents with   Migraine    Per patient since August 2021  ASSESSMENT/PLAN: Amy Charles is a/an 49 y.o. 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 Constant headache is ongoing and still has episodes of intermittent slurred speech. On DAPT.   We discussed the nature of dissections and low risk of stroke. Advised smoking increases risk of stroke, progressive artery narrowing and hinders healing of dissection.  MRI brain r/o evidence of ischemia.  Refer headache specialist. We discussed conservative measures.   Reviewed FAST/ER.  Counseled on smoking cessation.  Continue good blood pressure and cholesterol control.   I spent a total of 73minutes in both face-to-face and non-face-to-face activities for this visit on the date of this encounter.   I personally performed the service, non-incident to. (WP)   Amy PATRICIA PROVENZANO, PA    HISTORY OF PRESENT ILLNESS: This is a 49 y.o. female here for follow-up on her FMLA papers. Recent office visit with neurologist reviewed. Recent CT of brain and neck reviewed. Still having persistent intermittent chronic headaches. Unable to sleep.  Requesting sleep medication. Smoking less. No other complaints or medical concerns today.  HPI   Prior to Admission medications   Medication Sig Start Date End Date Taking? Authorizing Provider  ALPRAZolam Duanne Moron) 1 MG tablet Take 0.5 tablets (0.5 mg total) by mouth daily as needed for anxiety. 10/12/20  Yes Tyjay Galindo, Ines Bloomer, MD  butalbital-acetaminophen-caffeine (FIORICET) [phone] MG tablet Take 1-2 tablets by mouth every 6 (six) hours as needed for headache. 10/12/20 10/12/21 Yes Kashina Mecum, Ines Bloomer, MD  eletriptan (RELPAX) 20 MG tablet Take 1 tablet (20 mg total) by mouth as needed for migraine or headache. May repeat in 2 hours if headache  persists or recurs. 10/12/20  Yes Lafreda Casebeer, Ines Bloomer, MD  levocetirizine (XYZAL) 5 MG tablet Take 1 tablet (5 mg total) by mouth every evening. 06/19/18  Yes Jaynee Eagles, PA-C  lisinopril (ZESTRIL) 40 MG tablet Take 1 tablet (40 mg total) by mouth daily. 10/12/20  Yes Carnell Beavers, Ines Bloomer, MD  escitalopram (LEXAPRO) 10 MG tablet Take 1 tablet (10 mg total) by mouth daily. 07/07/20 10/05/20  Horald Pollen, MD  varenicline (CHANTIX STARTING MONTH PAK) 0.5 MG X 11 & 1 MG X 42 tablet Take one 0.5 mg tablet by mouth once daily for 3 days, then increase to one 0.5 mg tablet twice daily for 4 days, then increase to one 1 mg tablet twice daily. 10/15/19   Horald Pollen, MD    Allergies  Allergen Reactions   Augmentin [Amoxicillin-Pot Clavulanate] Nausea And Vomiting   Sumatriptan     convulsions   Tetracyclines & Related     Patient Active Problem List   Diagnosis Date Noted   Intractable chronic migraine without aura and without status migrainosus 07/20/2020   Chronic diarrhea 07/18/2018   Situational anxiety 07/18/2018   Essential hypertension 05/14/2018   Generalized anxiety disorder 05/14/2018   Chronic daily headache 05/18/2012    Past Medical History:  Diagnosis Date   Allergy    Anxiety    Cancer ([place])    Depression    Gallstones    GERD (gastroesophageal reflux disease)    IBS (irritable bowel syndrome)    Migraines     Past Surgical History:  Procedure Laterality Date   BREAST BIOPSY     CHOLECYSTECTOMY  N/A 12/25/2017   Procedure: LAPAROSCOPIC CHOLECYSTECTOMY;  Surgeon: Olean Ree, MD;  Location: [facility] ORS;  Service: General;  Laterality: N/A;   NOVASURE ABLATION     TUBAL LIGATION      Social History   Socioeconomic History   Marital status: Married    Spouse name: Not on file   Number of children: Not on file   Years of education: Not on file   Highest education level: Not on file  Occupational History   Not on  file  Tobacco Use   Smoking status: Current Every Day Smoker    Packs/day: 0.50    Years: 30.00    Pack years: 15.00    Types: Cigarettes   Smokeless tobacco: Never Used   Tobacco comment: off/on  Substance and Sexual Activity   Alcohol use: No   Drug use: No   Sexual activity: Not on file  Other Topics Concern   Not on file  Social History Narrative   Not on file   Social Determinants of Health   Financial Resource Strain: Not on file  Food Insecurity: Not on file  Transportation Needs: Not on file  Physical Activity: Not on file  Stress: Not on file  Social Connections: Not on file  Intimate Partner Violence: Not on file    Family History  Problem Relation Age of Onset   Diabetes Mother    Heart disease Mother    Hyperlipidemia Mother    Hypertension Mother    Heart disease Father    Hyperlipidemia Father    Hypertension Father    Cancer Father    Hyperlipidemia Brother    Hypertension Brother    Hyperlipidemia Brother    Hypertension Brother      Review of Systems  Constitutional: Negative.  Negative for chills and fever.  HENT: Negative.  Negative for congestion and sore throat.   Respiratory: Negative.  Negative for cough and shortness of breath.   Cardiovascular: Negative.  Negative for chest pain and palpitations.  Gastrointestinal: Negative.  Negative for abdominal pain, diarrhea, nausea and vomiting.  Genitourinary: Negative.  Negative for dysuria and hematuria.  Musculoskeletal: Negative.  Negative for back pain, myalgias and neck pain.  Skin: Negative.  Negative for rash.  Neurological: Positive for headaches. Negative for dizziness.  Psychiatric/Behavioral: The patient has insomnia.   All other systems reviewed and are negative.   Today's Vitals   11/10/20 0932  BP: 137/77  Pulse: (!) 59  Resp: 16  Temp: 97.6 F (36.4 C)  TempSrc: Temporal  SpO2: 95%  Weight: 209 lb (94.8 kg)  Height: 5\' 5"  (1.651 m)   Body mass  index is 34.78 kg/m. Wt Readings from Last 3 Encounters:  11/10/20 209 lb (94.8 kg)  10/12/20 204 lb (92.5 kg)  10/02/20 210 lb (95.3 kg)   BP Readings from Last 3 Encounters:  11/10/20 137/77  10/12/20 126/71  10/02/20 127/64    Physical Exam Vitals reviewed.  Constitutional:      Appearance: Normal appearance.  HENT:     Head: Normocephalic.  Eyes:     Extraocular Movements: Extraocular movements intact.     Conjunctiva/sclera: Conjunctivae normal.     Pupils: Pupils are equal, round, and reactive to light.  Cardiovascular:     Rate and Rhythm: Normal rate and regular rhythm.     Pulses: Normal pulses.     Heart sounds: Normal heart sounds.  Musculoskeletal:        General: Normal range of motion.  Cervical back: Normal range of motion and neck supple.  Skin:    General: Skin is warm and dry.     Capillary Refill: Capillary refill takes less than 2 seconds.  Neurological:     General: No focal deficit present.     Mental Status: She is alert and oriented to person, place, and time.  Psychiatric:        Mood and Affect: Mood normal.        Behavior: Behavior normal.    A total of 30 minutes was spent with the patient, greater than 50% of which was in counseling/coordination of care regarding chronic medical conditions, review of most recent office visit notes, review of most recent specialists office visit notes, review of most recent imaging results, review of most recent blood work results, review of all medications, insomnia and how to treat including new medication Ambien 10 mg, completion of FMLA paperwork, education on nutrition, counseling to stop smoking, prognosis, documentation, need for follow-up.   ASSESSMENT & PLAN: Amy Charles was seen today for migraine.  Diagnoses and all orders for this visit:  Insomnia, unspecified type -     zolpidem (AMBIEN) 10 MG tablet; Take 1 tablet (10 mg total) by mouth at bedtime as needed for sleep.  Intractable chronic  migraine without aura and without status migrainosus  Essential hypertension  History of dissection of internal carotid artery  Generalized anxiety disorder  Smoker    Patient Instructions       If you have lab work done today you will be contacted with your lab results within the next 2 weeks.  If you have not heard from [place] then please contact us. The fastest way to get your results is to register for My Chart.   IF you received an x-ray today, you will receive an invoice from [facility]. Please contact [facility] at [phone] with questions or concerns regarding your invoice.   IF you received labwork today, you will receive an invoice from [facility]. Please contact [facility] at [phone] with questions or concerns regarding your invoice.   Our billing staff will not be able to assist you with questions regarding bills from these companies.  You will be contacted with the lab results as soon as they are available. The fastest way to get your results is to activate your My Chart account. Instructions are located on the last page of this paperwork. If you have not heard from [place] regarding the results in 2 weeks, please contact this office.     Insomnia Insomnia is a sleep disorder that makes it difficult to fall asleep or stay asleep. Insomnia can cause fatigue, low energy, difficulty concentrating, mood swings, and poor performance at work or school. There are three different ways to classify insomnia:  Difficulty falling asleep.  Difficulty staying asleep.  Waking up too early in the morning. Any type of insomnia can be long-term (chronic) or short-term (acute). Both are common. Short-term insomnia usually lasts for three months or less. Chronic insomnia occurs at least three times a week for longer than three months. What are the causes? Insomnia may be caused by another condition, situation, or substance, such as:  Anxiety.  Certain  medicines.  Gastroesophageal reflux disease (GERD) or other gastrointestinal conditions.  Asthma or other breathing conditions.  Restless legs syndrome, sleep apnea, or other sleep disorders.  Chronic pain.  Menopause.  Stroke.  Abuse of alcohol, tobacco, or illegal drugs.  Mental health conditions, such as depression.  Caffeine.  Neurological disorders, such as Alzheimer's disease.  An overactive thyroid (hyperthyroidism). Sometimes, the cause of insomnia may not be known. What increases the risk? Risk factors for insomnia include:  Gender. Women are affected more often than men.  Age. Insomnia is more common as you get older.  Stress.  Lack of exercise.  Irregular work schedule or working night shifts.  Traveling between different time zones.  Certain medical and mental health conditions. What are the signs or symptoms? If you have insomnia, the main symptom is having trouble falling asleep or having trouble staying asleep. This may lead to other symptoms, such as:  Feeling fatigued or having low energy.  Feeling nervous about going to sleep.  Not feeling rested in the morning.  Having trouble concentrating.  Feeling irritable, anxious, or depressed. How is this diagnosed? This condition may be diagnosed based on:  Your symptoms and medical history. Your health care provider may ask about: ? Your sleep habits. ? Any medical conditions you have. ? Your mental health.  A physical exam. How is this treated? Treatment for insomnia depends on the cause. Treatment may focus on treating an underlying condition that is causing insomnia. Treatment may also include:  Medicines to help you sleep.  Counseling or therapy.  Lifestyle adjustments to help you sleep better. Follow these instructions at home: Eating and drinking   Limit or avoid alcohol, caffeinated beverages, and cigarettes, especially close to bedtime. These can disrupt your sleep.  Do not  eat a large meal or eat spicy foods right before bedtime. This can lead to digestive discomfort that can make it hard for you to sleep. Sleep habits   Keep a sleep diary to help you and your health care provider figure out what could be causing your insomnia. Write down: ? When you sleep. ? When you wake up during the night. ? How well you sleep. ? How rested you feel the next day. ? Any side effects of medicines you are taking. ? What you eat and drink.  Make your bedroom a dark, comfortable place where it is easy to fall asleep. ? Put up shades or blackout curtains to block light from outside. ? Use a white noise machine to block noise. ? Keep the temperature cool.  Limit screen use before bedtime. This includes: ? Watching TV. ? Using your smartphone, tablet, or computer.  Stick to a routine that includes going to bed and waking up at the same times every day and night. This can help you fall asleep faster. Consider making a quiet activity, such as reading, part of your nighttime routine.  Try to avoid taking naps during the day so that you sleep better at night.  Get out of bed if you are still awake after 15 minutes of trying to sleep. Keep the lights down, but try reading or doing a quiet activity. When you feel sleepy, go back to bed. General instructions  Take over-the-counter and prescription medicines only as told by your health care provider.  Exercise regularly, as told by your health care provider. Avoid exercise starting several hours before bedtime.  Use relaxation techniques to manage stress. Ask your health care provider to suggest some techniques that may work well for you. These may include: ? Breathing exercises. ? Routines to release muscle tension. ? Visualizing peaceful scenes.  Make sure that you drive carefully. Avoid driving if you feel very sleepy.  Keep all follow-up visits as told by your health care provider. This is important.  Contact a health  care provider if:  You are tired throughout the day.  You have trouble in your daily routine due to sleepiness.  You continue to have sleep problems, or your sleep problems get worse. Get help right away if:  You have serious thoughts about hurting yourself or someone else. If you ever feel like you may hurt yourself or others, or have thoughts about taking your own life, get help right away. You can go to your nearest emergency department or call:  Your local emergency services (911 in the [place]  A suicide crisis helpline, such as the [place] at 639-051-0547. This is open 24 hours a day. Summary  Insomnia is a sleep disorder that makes it difficult to fall asleep or stay asleep.  Insomnia can be long-term (chronic) or short-term (acute).  Treatment for insomnia depends on the cause. Treatment may focus on treating an underlying condition that is causing insomnia.  Keep a sleep diary to help you and your health care provider figure out what could be causing your insomnia. This information is not intended to replace advice given to you by your health care provider. Make sure you discuss any questions you have with your health care provider. Document Revised: 10/25/2017 Document Reviewed: 08/22/2017 Elsevier Patient Education  2020 Elsevier Inc.       Agustina Caroli, MD [facility]

## 2020-11-10 NOTE — Telephone Encounter (Signed)
Faxed completed disability certificate to Attn: Boris Lown and Disability Team [phone]). Confirmation at 2:11 pm.

## 2020-11-10 NOTE — Patient Instructions (Addendum)
   If you have lab work done today you will be contacted with your lab results within the next 2 weeks.  If you have not heard from us then please contact us. The fastest way to get your results is to register for My Chart.   IF you received an x-ray today, you will receive an invoice from [facility]. Please contact [facility] at [phone] with questions or concerns regarding your invoice.   IF you received labwork today, you will receive an invoice from [facility]. Please contact [facility] at [phone] with questions or concerns regarding your invoice.   Our billing staff will not be able to assist you with questions regarding bills from these companies.  You will be contacted with the lab results as soon as they are available. The fastest way to get your results is to activate your My Chart account. Instructions are located on the last page of this paperwork. If you have not heard from us regarding the results in 2 weeks, please contact this office.     Insomnia Insomnia is a sleep disorder that makes it difficult to fall asleep or stay asleep. Insomnia can cause fatigue, low energy, difficulty concentrating, mood swings, and poor performance at work or school. There are three different ways to classify insomnia:  Difficulty falling asleep.  Difficulty staying asleep.  Waking up too early in the morning. Any type of insomnia can be long-term (chronic) or short-term (acute). Both are common. Short-term insomnia usually lasts for three months or less. Chronic insomnia occurs at least three times a week for longer than three months. What are the causes? Insomnia may be caused by another condition, situation, or substance, such as:  Anxiety.  Certain medicines.  Gastroesophageal reflux disease (GERD) or other gastrointestinal conditions.  Asthma or other breathing conditions.  Restless legs syndrome, sleep apnea, or other sleep disorders.  Chronic  pain.  Menopause.  Stroke.  Abuse of alcohol, tobacco, or illegal drugs.  Mental health conditions, such as depression.  Caffeine.  Neurological disorders, such as Alzheimer's disease.  An overactive thyroid (hyperthyroidism). Sometimes, the cause of insomnia may not be known. What increases the risk? Risk factors for insomnia include:  Gender. Women are affected more often than men.  Age. Insomnia is more common as you get older.  Stress.  Lack of exercise.  Irregular work schedule or working night shifts.  Traveling between different time zones.  Certain medical and mental health conditions. What are the signs or symptoms? If you have insomnia, the main symptom is having trouble falling asleep or having trouble staying asleep. This may lead to other symptoms, such as:  Feeling fatigued or having low energy.  Feeling nervous about going to sleep.  Not feeling rested in the morning.  Having trouble concentrating.  Feeling irritable, anxious, or depressed. How is this diagnosed? This condition may be diagnosed based on:  Your symptoms and medical history. Your health care provider may ask about: ? Your sleep habits. ? Any medical conditions you have. ? Your mental health.  A physical exam. How is this treated? Treatment for insomnia depends on the cause. Treatment may focus on treating an underlying condition that is causing insomnia. Treatment may also include:  Medicines to help you sleep.  Counseling or therapy.  Lifestyle adjustments to help you sleep better. Follow these instructions at home: Eating and drinking   Limit or avoid alcohol, caffeinated beverages, and cigarettes, especially close to bedtime. These can disrupt your sleep.    Do not eat a large meal or eat spicy foods right before bedtime. This can lead to digestive discomfort that can make it hard for you to sleep. Sleep habits   Keep a sleep diary to help you and your health care  provider figure out what could be causing your insomnia. Write down: ? When you sleep. ? When you wake up during the night. ? How well you sleep. ? How rested you feel the next day. ? Any side effects of medicines you are taking. ? What you eat and drink.  Make your bedroom a dark, comfortable place where it is easy to fall asleep. ? Put up shades or blackout curtains to block light from outside. ? Use a white noise machine to block noise. ? Keep the temperature cool.  Limit screen use before bedtime. This includes: ? Watching TV. ? Using your smartphone, tablet, or computer.  Stick to a routine that includes going to bed and waking up at the same times every day and night. This can help you fall asleep faster. Consider making a quiet activity, such as reading, part of your nighttime routine.  Try to avoid taking naps during the day so that you sleep better at night.  Get out of bed if you are still awake after 15 minutes of trying to sleep. Keep the lights down, but try reading or doing a quiet activity. When you feel sleepy, go back to bed. General instructions  Take over-the-counter and prescription medicines only as told by your health care provider.  Exercise regularly, as told by your health care provider. Avoid exercise starting several hours before bedtime.  Use relaxation techniques to manage stress. Ask your health care provider to suggest some techniques that may work well for you. These may include: ? Breathing exercises. ? Routines to release muscle tension. ? Visualizing peaceful scenes.  Make sure that you drive carefully. Avoid driving if you feel very sleepy.  Keep all follow-up visits as told by your health care provider. This is important. Contact a health care provider if:  You are tired throughout the day.  You have trouble in your daily routine due to sleepiness.  You continue to have sleep problems, or your sleep problems get worse. Get help right  away if:  You have serious thoughts about hurting yourself or someone else. If you ever feel like you may hurt yourself or others, or have thoughts about taking your own life, get help right away. You can go to your nearest emergency department or call:  Your local emergency services (911 in the [place]  A suicide crisis helpline, such as the National Suicide Prevention Lifeline at 1-800-273-8255. This is open 24 hours a day. Summary  Insomnia is a sleep disorder that makes it difficult to fall asleep or stay asleep.  Insomnia can be long-term (chronic) or short-term (acute).  Treatment for insomnia depends on the cause. Treatment may focus on treating an underlying condition that is causing insomnia.  Keep a sleep diary to help you and your health care provider figure out what could be causing your insomnia. This information is not intended to replace advice given to you by your health care provider. Make sure you discuss any questions you have with your health care provider. Document Revised: 10/25/2017 Document Reviewed: 08/22/2017 Elsevier Patient Education  2020 Elsevier Inc.  

## 2020-11-15 DIAGNOSIS — G459 Transient cerebral ischemic attack, unspecified: Secondary | ICD-10-CM | POA: Diagnosis not present

## 2020-11-15 DIAGNOSIS — R519 Headache, unspecified: Secondary | ICD-10-CM | POA: Diagnosis not present

## 2020-11-15 DIAGNOSIS — R4789 Other speech disturbances: Secondary | ICD-10-CM | POA: Diagnosis not present

## 2020-11-15 DIAGNOSIS — R4781 Slurred speech: Secondary | ICD-10-CM | POA: Diagnosis not present

## 2020-11-16 DIAGNOSIS — I7771 Dissection of carotid artery: Secondary | ICD-10-CM | POA: Diagnosis not present

## 2020-11-21 NOTE — Telephone Encounter (Signed)
OK to extend

## 2020-11-30 ENCOUNTER — Telehealth: Payer: Self-pay | Admitting: *Deleted

## 2020-11-30 NOTE — Telephone Encounter (Signed)
Faxed completed and signed documents with medication list attached. Confirmation at 5:16 pm. Copies put on MacKenzie's desk.

## 2020-12-04 ENCOUNTER — Other Ambulatory Visit: Payer: Self-pay | Admitting: Emergency Medicine

## 2020-12-04 DIAGNOSIS — G43111 Migraine with aura, intractable, with status migrainosus: Secondary | ICD-10-CM

## 2020-12-05 NOTE — Telephone Encounter (Signed)
Patient is requesting a refill of the following medications: Requested Prescriptions   Pending Prescriptions Disp Refills   butalbital-acetaminophen-caffeine (FIORICET) 50-325-40 MG tablet [Pharmacy Med Name: BUTALB-ACETAMIN-CAFF 50-325-40] 20 tablet 1    Sig: Take 1-2 tablets by mouth every 6 (six) hours as needed for headache.    Date of patient request:12/04/20 Last office visit: 11/10/20 Date of last refill: 10/12/20 Last refill amount: 20 rf-1 Follow up time period per chart: 01/17/21

## 2020-12-05 NOTE — Telephone Encounter (Signed)
Requested medication (s) are due for refill todayyes  Requested medication (s) are on the active medication list: yes  Last refill:  11/02/2020  Future visit scheduled: yes  Notes to clinic:  this refill cannot be delegated   Requested Prescriptions  Pending Prescriptions Disp Refills   butalbital-acetaminophen-caffeine (FIORICET) 50-325-40 MG tablet [Pharmacy Med Name: BUTALB-ACETAMIN-CAFF 50-325-40] 20 tablet 1    Sig: Take 1-2 tablets by mouth every 6 (six) hours as needed for headache.      Not Delegated - Analgesics:  Non-Opioid Analgesic Combinations Failed - 12/04/2020  1:56 PM      Failed - This refill cannot be delegated      Passed - Valid encounter within last 12 months    Recent Outpatient Visits           3 weeks ago Insomnia, unspecified type   [facility] at [facility], Ines Bloomer, MD   1 month ago Essential hypertension   Primary Care at [place], [place], MD   3 months ago Intractable migraine with aura with status migrainosus   Primary Care at [facility], Ines Bloomer, MD   4 months ago Essential hypertension   Primary Care at [place], [place], MD   5 months ago Intractable chronic migraine without aura and without status migrainosus   Primary Care at [facility], Ines Bloomer, MD       Future Appointments             In 1 month [place], Ines Bloomer, MD Primary Care at [place], [facility]

## 2020-12-07 ENCOUNTER — Telehealth: Payer: Self-pay | Admitting: Emergency Medicine

## 2020-12-07 NOTE — Telephone Encounter (Signed)
12/07/2020 - I RECEIVED A CALL FROM HANNAH FROM [place]. SHE SENT OVER A FAX TO GET MEDICAL RECORDS ON THIS PATIENT OF DR. MIGUEL BUT ONLY GOT SOME FORMS BACK. SHE NEEDS OFFICE NOTES FROM 11/10/2020 TO PRESENT . SHE WILL REFAX THE RELEASE. PLEASE SEE CYNTHIA'S TELEPHONE NOTE FROM 11/30/2020 BUT I COULD NOT ATTACH THIS MESSAGE DUE TO ENCOUNTER ALREADY BEING CLOSED.  BEST PHONE IF QUESTIONS 1 [phone] [facility]) [place]

## 2020-12-07 NOTE — Telephone Encounter (Signed)
I received paperwork from [employer] for Dr. Mitchel Honour to fill out for pt. I called pt to collect a  $15 admin charge. She did not answer, so I left a vm for her to call PCP and we could get this over the phone. I have given this paperwork to [facility].

## 2020-12-08 NOTE — Telephone Encounter (Signed)
Received paperwork. Scanned to e-mail and placed hardcopy in [employer]

## 2020-12-08 NOTE — Telephone Encounter (Signed)
Printed and faxed back to leave team

## 2020-12-08 NOTE — Telephone Encounter (Signed)
Faxed out

## 2020-12-08 NOTE — Telephone Encounter (Signed)
Called Jarrett Soho RN for disability back no answer had to leave voicemail. Today is deadline for paperwork and they need to discuss a few questions that were faxed to [place].   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

## 2020-12-27 ENCOUNTER — Telehealth: Payer: Self-pay | Admitting: Emergency Medicine

## 2020-12-27 NOTE — Telephone Encounter (Signed)
Received call from Cecil at [employer].  Andee Poles stated short term disability ppwrk received. Andee Poles wants to know if there are any clinical exam findings to support patient's need for short term disability.   Please advise at [phone]

## 2020-12-29 NOTE — Telephone Encounter (Signed)
Called lm to call back,  There is no testing that has been done at this time as we are still waiting for her to see Neurology. Anything beyond this I am not sure what they are needing.

## 2020-12-30 NOTE — Telephone Encounter (Signed)
Spoke with pt this morning, we apparently missed a deadline that was not provided to [place]. Will call again today to discuss issues with this pt FMLA today.

## 2020-12-30 NOTE — Telephone Encounter (Signed)
Called Danielle again no answer.

## 2021-01-02 NOTE — Telephone Encounter (Signed)
Received new forms today copy made and placed in folder.  Copy has been filled out and given to provider to sign and I will fax back once complete

## 2021-01-02 NOTE — Telephone Encounter (Signed)
Called again and no answer left another message today

## 2021-01-02 NOTE — Telephone Encounter (Signed)
Faxed out. Unsure why this form was needed to verify leave time. Forms were submitted 10/12/2020, 11/10/2020, 11/30/2020,12/08/2020, and again on 12/19/2020

## 2021-01-07 ENCOUNTER — Other Ambulatory Visit: Payer: Self-pay | Admitting: Emergency Medicine

## 2021-01-07 DIAGNOSIS — G4700 Insomnia, unspecified: Secondary | ICD-10-CM

## 2021-01-07 NOTE — Telephone Encounter (Signed)
Requested medications are due for refill today.  yes  Requested medications are on the active medications list.  yes  Last refill. 11/10/2020  Future visit scheduled.   yes  Notes to clinic.  Not delegated.

## 2021-01-08 ENCOUNTER — Other Ambulatory Visit: Payer: Self-pay | Admitting: Emergency Medicine

## 2021-01-08 DIAGNOSIS — G43719 Chronic migraine without aura, intractable, without status migrainosus: Secondary | ICD-10-CM

## 2021-01-08 NOTE — Telephone Encounter (Signed)
Requested Prescriptions  Pending Prescriptions Disp Refills  . eletriptan (RELPAX) 20 MG tablet [Pharmacy Med Name: ELETRIPTAN HBR 20 MG TABLET] 10 tablet 2    Sig: TAKE 1 TABLET (20 MG TOTAL) BY MOUTH AS NEEDED FOR MIGRAINE OR HEADACHE. MAY REPEAT IN 2 HOURS IF HEADACHE PERSISTS OR RECURS.     Neurology:  Migraine Therapy - Triptan Passed - 01/08/2021 12:25 AM      Passed - Last BP in normal range    BP Readings from Last 1 Encounters:  11/10/20 137/77         Passed - Valid encounter within last 12 months    Recent Outpatient Visits          1 month ago Insomnia, unspecified type   Primary Care at [facility], Ines Bloomer, MD   2 months ago Essential hypertension   Primary Care at [place], [place], MD   4 months ago Intractable migraine with aura with status migrainosus   Primary Care at [facility], Ines Bloomer, MD   5 months ago Essential hypertension   Primary Care at [place], [place], MD   6 months ago Intractable chronic migraine without aura and without status migrainosus   Primary Care at [facility], Ines Bloomer, MD      Future Appointments            In 1 week Sagardia, Ines Bloomer, MD Primary Care at [place], [facility]

## 2021-01-09 NOTE — Telephone Encounter (Signed)
pending refill Ambien 10 mg, last ordered 11/10/2020 with 15 tablets. Thank you.

## 2021-01-14 ENCOUNTER — Other Ambulatory Visit: Payer: Self-pay | Admitting: Emergency Medicine

## 2021-01-14 DIAGNOSIS — G43111 Migraine with aura, intractable, with status migrainosus: Secondary | ICD-10-CM

## 2021-01-14 NOTE — Telephone Encounter (Signed)
Requested medications are due for refill today.  yes  Requested medications are on the active medications list.  yes  Last refill. yes  Future visit scheduled.   yes  Notes to clinic. Medication not delegated.

## 2021-01-16 NOTE — Telephone Encounter (Signed)
Patient is requesting a refill of the following medications: Requested Prescriptions   Pending Prescriptions Disp Refills   butalbital-acetaminophen-caffeine (FIORICET) 50-325-40 MG tablet [Pharmacy Med Name: BUTALB-ACETAMIN-CAFF 50-325-40] 20 tablet 1    Sig: TAKE 1-2 TABLETS BY MOUTH EVERY 6 (SIX) HOURS AS NEEDED FOR HEADACHE.    Date of patient request: 01/14/21 Last office visit: 11/10/20 Date of last refill: 12/05/20 Last refill amount: 20  Follow up time period per chart: 01/25/21

## 2021-01-17 ENCOUNTER — Ambulatory Visit: Payer: BC Managed Care – PPO | Admitting: Emergency Medicine

## 2021-01-25 ENCOUNTER — Ambulatory Visit: Payer: BC Managed Care – PPO | Admitting: Emergency Medicine

## 2021-02-04 ENCOUNTER — Other Ambulatory Visit: Payer: Self-pay | Admitting: Emergency Medicine

## 2021-02-04 DIAGNOSIS — F418 Other specified anxiety disorders: Secondary | ICD-10-CM

## 2021-02-04 NOTE — Telephone Encounter (Signed)
Requested medication (s) are due for refill today: yes  Requested medication (s) are on the active medication list: yes  Last refill:  10/12/20 #30 1 RF  Future visit scheduled: yes  Notes to clinic:  med not delegated to NT to RF   Requested Prescriptions  Pending Prescriptions Disp Refills   ALPRAZolam (XANAX) 1 MG tablet [Pharmacy Med Name: ALPRAZOLAM 1 MG TABLET] 15 tablet 3    Sig: TAKE 1/2 TABLET BY MOUTH DAILY AS NEEDED FOR ANXIETY      Not Delegated - Psychiatry:  Anxiolytics/Hypnotics Failed - 02/04/2021  3:39 PM      Failed - This refill cannot be delegated      Failed - Urine Drug Screen completed in last 360 days      Passed - Valid encounter within last 6 months    Recent Outpatient Visits           2 months ago Insomnia, unspecified type   Primary Care at [facility], Ines Bloomer, MD   3 months ago Essential hypertension   Primary Care at [place], [place], MD   5 months ago Intractable migraine with aura with status migrainosus   Primary Care at [facility], Ines Bloomer, MD   6 months ago Essential hypertension   Primary Care at [place], [place], MD   7 months ago Intractable chronic migraine without aura and without status migrainosus   Primary Care at [facility], Ines Bloomer, MD       Future Appointments             In 4 days Sagardia, Ines Bloomer, MD Primary Care at [place], [facility]

## 2021-02-07 NOTE — Telephone Encounter (Signed)
Patient is requesting a refill of the following medications: Requested Prescriptions   Pending Prescriptions Disp Refills   ALPRAZolam (XANAX) 1 MG tablet [Pharmacy Med Name: ALPRAZOLAM 1 MG TABLET] 15 tablet 3    Sig: TAKE 1/2 TABLET BY MOUTH DAILY AS NEEDED FOR ANXIETY    Date of patient request: 02/04/21 Last office visit: 11/10/20 Date of last refill: 11/09/20 Last refill amount: 30 1rf Follow up time period per chart: 02/08/21

## 2021-02-08 ENCOUNTER — Other Ambulatory Visit: Payer: Self-pay

## 2021-02-08 ENCOUNTER — Ambulatory Visit (INDEPENDENT_AMBULATORY_CARE_PROVIDER_SITE_OTHER): Payer: BC Managed Care – PPO | Admitting: Emergency Medicine

## 2021-02-08 ENCOUNTER — Encounter: Payer: Self-pay | Admitting: Emergency Medicine

## 2021-02-08 VITALS — BP 122/72 | HR 83 | Temp 97.6°F | Resp 16 | Ht 65.0 in | Wt 208.0 lb

## 2021-02-08 DIAGNOSIS — I10 Essential (primary) hypertension: Secondary | ICD-10-CM

## 2021-02-08 DIAGNOSIS — F411 Generalized anxiety disorder: Secondary | ICD-10-CM

## 2021-02-08 DIAGNOSIS — M545 Low back pain, unspecified: Secondary | ICD-10-CM

## 2021-02-08 DIAGNOSIS — Z8679 Personal history of other diseases of the circulatory system: Secondary | ICD-10-CM

## 2021-02-08 DIAGNOSIS — Z1211 Encounter for screening for malignant neoplasm of colon: Secondary | ICD-10-CM

## 2021-02-08 DIAGNOSIS — I7771 Dissection of carotid artery: Secondary | ICD-10-CM | POA: Diagnosis not present

## 2021-02-08 DIAGNOSIS — F17200 Nicotine dependence, unspecified, uncomplicated: Secondary | ICD-10-CM

## 2021-02-08 DIAGNOSIS — G8929 Other chronic pain: Secondary | ICD-10-CM

## 2021-02-08 DIAGNOSIS — G4700 Insomnia, unspecified: Secondary | ICD-10-CM

## 2021-02-08 NOTE — Patient Instructions (Addendum)
   If you have lab work done today you will be contacted with your lab results within the next 2 weeks.  If you have not heard from us then please contact us. The fastest way to get your results is to register for My Chart.   IF you received an x-ray today, you will receive an invoice from [facility]. Please contact [facility] at [phone] with questions or concerns regarding your invoice.   IF you received labwork today, you will receive an invoice from [facility]. Please contact [facility] at [phone] with questions or concerns regarding your invoice.   Our billing staff will not be able to assist you with questions regarding bills from these companies.  You will be contacted with the lab results as soon as they are available. The fastest way to get your results is to activate your My Chart account. Instructions are located on the last page of this paperwork. If you have not heard from us regarding the results in 2 weeks, please contact this office.     Health Maintenance, Female Adopting a healthy lifestyle and getting preventive care are important in promoting health and wellness. Ask your health care provider about:  The right schedule for you to have regular tests and exams.  Things you can do on your own to prevent diseases and keep yourself healthy. What should I know about diet, weight, and exercise? Eat a healthy diet  Eat a diet that includes plenty of vegetables, fruits, low-fat dairy products, and lean protein.  Do not eat a lot of foods that are high in solid fats, added sugars, or sodium.   Maintain a healthy weight Body mass index (BMI) is used to identify weight problems. It estimates body fat based on height and weight. Your health care provider can help determine your BMI and help you achieve or maintain a healthy weight. Get regular exercise Get regular exercise. This is one of the most important things you can do for your health. Most  adults should:  Exercise for at least 150 minutes each week. The exercise should increase your heart rate and make you sweat (moderate-intensity exercise).  Do strengthening exercises at least twice a week. This is in addition to the moderate-intensity exercise.  Spend less time sitting. Even light physical activity can be beneficial. Watch cholesterol and blood lipids Have your blood tested for lipids and cholesterol at 49 years of age, then have this test every 5 years. Have your cholesterol levels checked more often if:  Your lipid or cholesterol levels are high.  You are older than 49 years of age.  You are at high risk for heart disease. What should I know about cancer screening? Depending on your health history and family history, you may need to have cancer screening at various ages. This may include screening for:  Breast cancer.  Cervical cancer.  Colorectal cancer.  Skin cancer.  Lung cancer. What should I know about heart disease, diabetes, and high blood pressure? Blood pressure and heart disease  High blood pressure causes heart disease and increases the risk of stroke. This is more likely to develop in people who have high blood pressure readings, are of African descent, or are overweight.  Have your blood pressure checked: ? Every 3-5 years if you are 18-39 years of age. ? Every year if you are 40 years old or older. Diabetes Have regular diabetes screenings. This checks your fasting blood sugar level. Have the screening done:  Once every   three years after age 40 if you are at a normal weight and have a low risk for diabetes.  More often and at a younger age if you are overweight or have a high risk for diabetes. What should I know about preventing infection? Hepatitis B If you have a higher risk for hepatitis B, you should be screened for this virus. Talk with your health care provider to find out if you are at risk for hepatitis B infection. Hepatitis  C Testing is recommended for:  Everyone born from 1945 through 1965.  Anyone with known risk factors for hepatitis C. Sexually transmitted infections (STIs)  Get screened for STIs, including gonorrhea and chlamydia, if: ? You are sexually active and are younger than 49 years of age. ? You are older than 49 years of age and your health care provider tells you that you are at risk for this type of infection. ? Your sexual activity has changed since you were last screened, and you are at increased risk for chlamydia or gonorrhea. Ask your health care provider if you are at risk.  Ask your health care provider about whether you are at high risk for HIV. Your health care provider may recommend a prescription medicine to help prevent HIV infection. If you choose to take medicine to prevent HIV, you should first get tested for HIV. You should then be tested every 3 months for as long as you are taking the medicine. Pregnancy  If you are about to stop having your period (premenopausal) and you may become pregnant, seek counseling before you get pregnant.  Take 400 to 800 micrograms (mcg) of folic acid every day if you become pregnant.  Ask for birth control (contraception) if you want to prevent pregnancy. Osteoporosis and menopause Osteoporosis is a disease in which the bones lose minerals and strength with aging. This can result in bone fractures. If you are 65 years old or older, or if you are at risk for osteoporosis and fractures, ask your health care provider if you should:  Be screened for bone loss.  Take a calcium or vitamin D supplement to lower your risk of fractures.  Be given hormone replacement therapy (HRT) to treat symptoms of menopause. Follow these instructions at home: Lifestyle  Do not use any products that contain nicotine or tobacco, such as cigarettes, e-cigarettes, and chewing tobacco. If you need help quitting, ask your health care provider.  Do not use street  drugs.  Do not share needles.  Ask your health care provider for help if you need support or information about quitting drugs. Alcohol use  Do not drink alcohol if: ? Your health care provider tells you not to drink. ? You are pregnant, may be pregnant, or are planning to become pregnant.  If you drink alcohol: ? Limit how much you use to 0-1 drink a day. ? Limit intake if you are breastfeeding.  Be aware of how much alcohol is in your drink. In the [place] one drink equals one 12 oz bottle of beer (355 mL), one 5 oz glass of wine (148 mL), or one 1 oz glass of hard liquor (44 mL). General instructions  Schedule regular health, dental, and eye exams.  Stay current with your vaccines.  Tell your health care provider if: ? You often feel depressed. ? You have ever been abused or do not feel safe at home. Summary  Adopting a healthy lifestyle and getting preventive care are important in promoting health and wellness.    Follow your health care provider's instructions about healthy diet, exercising, and getting tested or screened for diseases.  Follow your health care provider's instructions on monitoring your cholesterol and blood pressure. This information is not intended to replace advice given to you by your health care provider. Make sure you discuss any questions you have with your health care provider. Document Revised: 11/05/2018 Document Reviewed: 11/05/2018 Elsevier Patient Education  2021 [employer]  

## 2021-02-08 NOTE — Progress Notes (Signed)
Amy Charles 50 y.o.   Chief Complaint  Patient presents with  . Hypertension    Follow up 6 month    HISTORY OF PRESENT ILLNESS: This is a 50 y.o. female here for follow-up of chronic medical problems. Has history of hypertension on lisinopril 40 mg daily. Has history of chronic persistent migraine headaches. Has history of chronic pain to lumbar area on right hip. Chronic pain interfering with her quality of life. Chronic smoker but smoking less. History of dissection of carotid artery. Has follow-up appointment with neurologist next week.  HPI   Prior to Admission medications   Medication Sig Start Date End Date Taking? Authorizing Provider  ALPRAZolam Duanne Moron) 1 MG tablet TAKE 1/2 TABLET BY MOUTH DAILY AS NEEDED FOR ANXIETY 02/07/21   Horald Pollen, MD  butalbital-acetaminophen-caffeine (FIORICET) [phone] MG tablet TAKE 1-2 TABLETS BY MOUTH EVERY 6 (SIX) HOURS AS NEEDED FOR HEADACHE. 01/16/21 01/16/22  Horald Pollen, MD  eletriptan (RELPAX) 20 MG tablet TAKE 1 TABLET (20 MG TOTAL) BY MOUTH AS NEEDED FOR MIGRAINE OR HEADACHE. MAY REPEAT IN 2 HOURS IF HEADACHE PERSISTS OR RECURS. 01/08/21   Horald Pollen, MD  escitalopram (LEXAPRO) 10 MG tablet Take 1 tablet (10 mg total) by mouth daily. 07/07/20 10/05/20  Horald Pollen, MD  levocetirizine (XYZAL) 5 MG tablet Take 1 tablet (5 mg total) by mouth every evening. 06/19/18   Jaynee Eagles, PA-C  lisinopril (ZESTRIL) 40 MG tablet Take 1 tablet (40 mg total) by mouth daily. 10/12/20   Horald Pollen, MD  varenicline (CHANTIX STARTING MONTH PAK) 0.5 MG X 11 & 1 MG X 42 tablet Take one 0.5 mg tablet by mouth once daily for 3 days, then increase to one 0.5 mg tablet twice daily for 4 days, then increase to one 1 mg tablet twice daily. 10/15/19   Horald Pollen, MD  zolpidem (AMBIEN) 10 MG tablet TAKE 1 TABLET BY MOUTH AT BEDTIME AS NEEDED FOR SLEEP. 01/10/21   Horald Pollen, MD    Allergies   Allergen Reactions  . Augmentin [Amoxicillin-Pot Clavulanate] Nausea And Vomiting  . Sumatriptan     convulsions  . Tetracyclines & Related     Patient Active Problem List   Diagnosis Date Noted  . 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 Chronic diarrhea 07/18/2018  . Situational anxiety 07/18/2018  . Essential hypertension 05/14/2018  . Generalized anxiety disorder 05/14/2018  . Chronic daily headache 05/18/2012    Past Medical History:  Diagnosis Date  . Allergy   . Anxiety   . Cancer ([place])   . Depression   . Gallstones   . GERD (gastroesophageal reflux disease)   . IBS (irritable bowel syndrome)   . Migraines     Past Surgical History:  Procedure Laterality Date  . BREAST BIOPSY    . CHOLECYSTECTOMY N/A 12/25/2017   Procedure: LAPAROSCOPIC CHOLECYSTECTOMY;  Surgeon: Olean Ree, MD;  Location: [facility] ORS;  Service: General;  Laterality: N/A;  . NOVASURE ABLATION    . TUBAL LIGATION      Social History   Socioeconomic History  . Marital status: Married    Spouse name: Not on file  . Number of children: Not on file  . Years of education: Not on file  . Highest education level: Not on file  Occupational History  . Not on file  Tobacco Use  . Smoking status: Current Every Day Smoker    Packs/day: 0.50  Years: 30.00    Pack years: 15.00    Types: Cigarettes  . Smokeless tobacco: Never Used  . Tobacco comment: off/on  Substance and Sexual Activity  . Alcohol use: No  . Drug use: No  . Sexual activity: Not on file  Other Topics Concern  . Not on file  Social History Narrative  . Not on file   Social Determinants of Health   Financial Resource Strain: Not on file  Food Insecurity: Not on file  Transportation Needs: Not on file  Physical Activity: Not on file  Stress: Not on file  Social Connections: Not on file  Intimate Partner Violence: Not on file    Family History  Problem Relation Age of  Onset  . Diabetes Mother   . Heart disease Mother   . Hyperlipidemia Mother   . Hypertension Mother   . Heart disease Father   . Hyperlipidemia Father   . Hypertension Father   . Cancer Father   . Hyperlipidemia Brother   . Hypertension Brother   . Hyperlipidemia Brother   . Hypertension Brother      Review of Systems  Constitutional: Negative.  Negative for chills and fever.  HENT: Negative.  Negative for congestion and sore throat.   Eyes: Negative for blurred vision and double vision.  Respiratory: Negative.  Negative for cough and shortness of breath.   Cardiovascular: Negative.  Negative for chest pain and palpitations.  Gastrointestinal: Negative.  Negative for abdominal pain, blood in stool, diarrhea, melena, nausea and vomiting.  Genitourinary: Negative.  Negative for dysuria and hematuria.  Musculoskeletal: Positive for back pain.  Skin: Negative.  Negative for rash.  Neurological: Positive for headaches. Negative for dizziness and sensory change.  All other systems reviewed and are negative.   Today's Vitals   02/08/21 1104  BP: 122/72  Pulse: 83  Resp: 16  Temp: 97.6 F (36.4 C)  TempSrc: Temporal  SpO2: 97%  Weight: 208 lb (94.3 kg)  Height: 5\' 5"  (1.651 m)   Body mass index is 34.61 kg/m. BP Readings from Last 3 Encounters:  02/08/21 122/72  11/10/20 137/77  10/12/20 126/71    Physical Exam Vitals reviewed.  Constitutional:      Appearance: Normal appearance.  HENT:     Head: Normocephalic.  Eyes:     Extraocular Movements: Extraocular movements intact.     Pupils: Pupils are equal, round, and reactive to light.  Cardiovascular:     Rate and Rhythm: Normal rate and regular rhythm.     Pulses: Normal pulses.     Heart sounds: Normal heart sounds.  Pulmonary:     Effort: Pulmonary effort is normal.     Breath sounds: Normal breath sounds.  Musculoskeletal:        General: Normal range of motion.     Cervical back: Normal range of motion.   Skin:    General: Skin is warm and dry.     Capillary Refill: Capillary refill takes less than 2 seconds.  Neurological:     General: No focal deficit present.     Mental Status: She is alert and oriented to person, place, and time.  Psychiatric:        Mood and Affect: Mood normal.        Behavior: Behavior normal.      ASSESSMENT & PLAN: Aricka was seen today for hypertension.  Diagnoses and all orders for this visit:  Essential hypertension  History of dissection of internal carotid artery  Generalized anxiety disorder  Internal carotid artery dissection (HCC)  Screening for colon cancer -     Ambulatory referral to Gastroenterology  Chronic bilateral low back pain without sciatica -     Ambulatory referral to Orthopedic Surgery  Smoker  Insomnia, unspecified type    Patient Instructions       If you have lab work done today you will be contacted with your lab results within the next 2 weeks.  If you have not heard from [place] then please contact us. The fastest way to get your results is to register for My Chart.   IF you received an x-ray today, you will receive an invoice from [facility]. Please contact [facility] at [phone] with questions or concerns regarding your invoice.   IF you received labwork today, you will receive an invoice from [facility]. Please contact [facility] at [phone] with questions or concerns regarding your invoice.   Our billing staff will not be able to assist you with questions regarding bills from these companies.  You will be contacted with the lab results as soon as they are available. The fastest way to get your results is to activate your My Chart account. Instructions are located on the last page of this paperwork. If you have not heard from [place] regarding the results in 2 weeks, please contact this office.     Health Maintenance, Female Adopting a healthy lifestyle and getting preventive care are  important in promoting health and wellness. Ask your health care provider about:  The right schedule for you to have regular tests and exams.  Things you can do on your own to prevent diseases and keep yourself healthy. What should I know about diet, weight, and exercise? Eat a healthy diet  Eat a diet that includes plenty of vegetables, fruits, low-fat dairy products, and lean protein.  Do not eat a lot of foods that are high in solid fats, added sugars, or sodium.   Maintain a healthy weight Body mass index (BMI) is used to identify weight problems. It estimates body fat based on height and weight. Your health care provider can help determine your BMI and help you achieve or maintain a healthy weight. Get regular exercise Get regular exercise. This is one of the most important things you can do for your health. Most adults should:  Exercise for at least 150 minutes each week. The exercise should increase your heart rate and make you sweat (moderate-intensity exercise).  Do strengthening exercises at least twice a week. This is in addition to the moderate-intensity exercise.  Spend less time sitting. Even light physical activity can be beneficial. Watch cholesterol and blood lipids Have your blood tested for lipids and cholesterol at 49 years of age, then have this test every 5 years. Have your cholesterol levels checked more often if:  Your lipid or cholesterol levels are high.  You are older than 49 years of age.  You are at high risk for heart disease. What should I know about cancer screening? Depending on your health history and family history, you may need to have cancer screening at various ages. This may include screening for:  Breast cancer.  Cervical cancer.  Colorectal cancer.  Skin cancer.  Lung cancer. What should I know about heart disease, diabetes, and high blood pressure? Blood pressure and heart disease  High blood pressure causes heart disease and  increases the risk of stroke. This is more likely to develop in people who have high blood  pressure readings, are of African descent, or are overweight.  Have your blood pressure checked: ? Every 3-5 years if you are 63-8 years of age. ? Every year if you are 47 years old or older. Diabetes Have regular diabetes screenings. This checks your fasting blood sugar level. Have the screening done:  Once every three years after age 10 if you are at a normal weight and have a low risk for diabetes.  More often and at a younger age if you are overweight or have a high risk for diabetes. What should I know about preventing infection? Hepatitis B If you have a higher risk for hepatitis B, you should be screened for this virus. Talk with your health care provider to find out if you are at risk for hepatitis B infection. Hepatitis C Testing is recommended for:  Everyone born from [birth year] through 1964.  Anyone with known risk factors for hepatitis C. Sexually transmitted infections (STIs)  Get screened for STIs, including gonorrhea and chlamydia, if: ? You are sexually active and are younger than 49 years of age. ? You are older than 48 years of age and your health care provider tells you that you are at risk for this type of infection. ? Your sexual activity has changed since you were last screened, and you are at increased risk for chlamydia or gonorrhea. Ask your health care provider if you are at risk.  Ask your health care provider about whether you are at high risk for HIV. Your health care provider may recommend a prescription medicine to help prevent HIV infection. If you choose to take medicine to prevent HIV, you should first get tested for HIV. You should then be tested every 3 months for as long as you are taking the medicine. Pregnancy  If you are about to stop having your period (premenopausal) and you may become pregnant, seek counseling before you get pregnant.  Take 400 to 800  micrograms (mcg) of folic acid every day if you become pregnant.  Ask for birth control (contraception) if you want to prevent pregnancy. Osteoporosis and menopause Osteoporosis is a disease in which the bones lose minerals and strength with aging. This can result in bone fractures. If you are 43 years old or older, or if you are at risk for osteoporosis and fractures, ask your health care provider if you should:  Be screened for bone loss.  Take a calcium or vitamin D supplement to lower your risk of fractures.  Be given hormone replacement therapy (HRT) to treat symptoms of menopause. Follow these instructions at home: Lifestyle  Do not use any products that contain nicotine or tobacco, such as cigarettes, e-cigarettes, and chewing tobacco. If you need help quitting, ask your health care provider.  Do not use street drugs.  Do not share needles.  Ask your health care provider for help if you need support or information about quitting drugs. Alcohol use  Do not drink alcohol if: ? Your health care provider tells you not to drink. ? You are pregnant, may be pregnant, or are planning to become pregnant.  If you drink alcohol: ? Limit how much you use to 0-1 drink a day. ? Limit intake if you are breastfeeding.  Be aware of how much alcohol is in your drink. In the [place] one drink equals one 12 oz bottle of beer (355 mL), one 5 oz glass of wine (148 mL), or one 1 oz glass of hard liquor (44 mL). General instructions  Schedule regular health, dental, and eye exams.  Stay current with your vaccines.  Tell your health care provider if: ? You often feel depressed. ? You have ever been abused or do not feel safe at home. Summary  Adopting a healthy lifestyle and getting preventive care are important in promoting health and wellness.  Follow your health care provider's instructions about healthy diet, exercising, and getting tested or screened for diseases.  Follow your health  care provider's instructions on monitoring your cholesterol and blood pressure. This information is not intended to replace advice given to you by your health care provider. Make sure you discuss any questions you have with your health care provider. Document Revised: 11/05/2018 Document Reviewed: 11/05/2018 Elsevier Patient Education  2021 Elsevier Inc.      Agustina Caroli, MD [facility]

## 2021-02-15 DIAGNOSIS — G43719 Chronic migraine without aura, intractable, without status migrainosus: Secondary | ICD-10-CM | POA: Diagnosis not present

## 2021-02-15 DIAGNOSIS — G4440 Drug-induced headache, not elsewhere classified, not intractable: Secondary | ICD-10-CM | POA: Diagnosis not present

## 2021-02-15 DIAGNOSIS — G4700 Insomnia, unspecified: Secondary | ICD-10-CM | POA: Diagnosis not present

## 2021-03-02 ENCOUNTER — Ambulatory Visit (INDEPENDENT_AMBULATORY_CARE_PROVIDER_SITE_OTHER): Payer: BC Managed Care – PPO | Admitting: Family Medicine

## 2021-03-02 ENCOUNTER — Ambulatory Visit: Payer: Self-pay

## 2021-03-02 ENCOUNTER — Encounter: Payer: Self-pay | Admitting: Family Medicine

## 2021-03-02 DIAGNOSIS — G8929 Other chronic pain: Secondary | ICD-10-CM

## 2021-03-02 DIAGNOSIS — M25552 Pain in left hip: Secondary | ICD-10-CM

## 2021-03-02 DIAGNOSIS — M545 Low back pain, unspecified: Secondary | ICD-10-CM

## 2021-03-02 MED ORDER — BACLOFEN 10 MG PO TABS: 5.0000 mg | ORAL_TABLET | Freq: Three times a day (TID) | ORAL | 3 refills | Status: DC | PRN

## 2021-03-02 MED ORDER — CELECOXIB 200 MG PO CAPS: 200.0000 mg | ORAL_CAPSULE | Freq: Two times a day (BID) | ORAL | 6 refills | Status: AC | PRN

## 2021-03-02 NOTE — Progress Notes (Signed)
Office Visit Note   Patient: Amy Charles           Date of Birth: 03-May-1971           MRN: [id number] Visit Date: 03/02/2021 Requested by: Horald Pollen, MD [place],  [place] [address] PCP: Horald Pollen, MD  Subjective: Chief Complaint  Patient presents with  . Lower Back - Pain    Pain in the center of the lower back, going down to the tailbone and over to the left posterior hip, "to the hip joint." Has had this pain x 8 months. Pain is constant. NKI  . Left Hip - Pain    HPI: She is here with low back and left posterior hip pain.  Symptoms started about 7 or 8 months ago, no injury.  Gradual onset of pain in the midline low back with occasional radiation into the left posterior hip.  Pain is constant, but better when in a recliner.  She cannot be in any 1 position for too long without having to change.  Denies any bowel or bladder dysfunction, fevers or chills.  She has tried over-the-counter anti-inflammatories with minimal improvement.  No previous problems with her back.  She is currently out of work due to chronic headaches and is unable to drive.  She had a CT scan recently which showed injury to the carotid artery and is currently on Plavix.                ROS:   All other systems were reviewed and are negative.  Objective: Vital Signs: There were no vitals taken for this visit.  Physical Exam:  General:  Alert and oriented, in no acute distress. Pulm:  Breathing unlabored. Psy:  Normal mood, congruent affect.  Low back: She is tender in the midline over the L5-S1 level.  She has mild pain over the left SI joint.  Mild tenderness in the left sciatic notch.  Straight leg raise is negative, no pain with internal hip rotation.  Lower extremity strength and reflexes are normal.    Imaging: XR HIP UNILAT W OR W/O PELVIS 2-3 VIEWS LEFT  Result Date: 03/02/2021 X-rays of the left hip reveal well-preserved joint space, no significant arthritic  change, no sign of stress fracture or neoplasm.  XR Lumbar Spine 2-3 Views  Result Date: 03/02/2021 X-rays lumbar spine reveal mild to moderate L4-5 and L5-S1 degenerative disc disease and facet DJD.  No compression fracture seen, no sign of neoplasm.   Assessment & Plan: 1.  Chronic low back pain, suspect due to lumbar spondylosis.  Neurologic exam is nonfocal. -We will try home exercises.  Celebrex and baclofen as needed.  Physical therapy if symptoms persist, although right now she cannot drive so she has trouble finding transportation.     Procedures: No procedures performed        PMFS History: Patient Active Problem List   Diagnosis Date Noted  . Internal carotid artery dissection ([place]) 02/08/2021  . 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 Chronic diarrhea 07/18/2018  . Situational anxiety 07/18/2018  . Essential hypertension 05/14/2018  . Generalized anxiety disorder 05/14/2018  . Chronic daily headache 05/18/2012   Past Medical History:  Diagnosis Date  . Allergy   . Anxiety   . Cancer ([place])   . Depression   . Gallstones   . GERD (gastroesophageal reflux disease)   . IBS (irritable bowel syndrome)   . Migraines  Family History  Problem Relation Age of Onset  . Diabetes Mother   . Heart disease Mother   . Hyperlipidemia Mother   . Hypertension Mother   . Heart disease Father   . Hyperlipidemia Father   . Hypertension Father   . Cancer Father   . Hyperlipidemia Brother   . Hypertension Brother   . Hyperlipidemia Brother   . Hypertension Brother     Past Surgical History:  Procedure Laterality Date  . BREAST BIOPSY    . CHOLECYSTECTOMY N/A 12/25/2017   Procedure: LAPAROSCOPIC CHOLECYSTECTOMY;  Surgeon: Olean Ree, MD;  Location: [facility] ORS;  Service: General;  Laterality: N/A;  . NOVASURE ABLATION    . TUBAL LIGATION     Social History   Occupational History  . Not on file  Tobacco Use  .  Smoking status: Current Every Day Smoker    Packs/day: 0.50    Years: 30.00    Pack years: 15.00    Types: Cigarettes  . Smokeless tobacco: Never Used  . Tobacco comment: off/on  Substance and Sexual Activity  . Alcohol use: No  . Drug use: No  . Sexual activity: Not on file

## 2021-04-05 DIAGNOSIS — I7771 Dissection of carotid artery: Secondary | ICD-10-CM | POA: Diagnosis not present

## 2021-04-21 DIAGNOSIS — G43719 Chronic migraine without aura, intractable, without status migrainosus: Secondary | ICD-10-CM | POA: Diagnosis not present

## 2021-04-29 ENCOUNTER — Encounter: Payer: Self-pay | Admitting: Emergency Medicine

## 2021-05-01 NOTE — Telephone Encounter (Signed)
Please Advise

## 2021-05-02 NOTE — Telephone Encounter (Signed)
Called and sent mychart message to schedule pt for an OV. Waiting for pt reply.

## 2021-05-04 ENCOUNTER — Other Ambulatory Visit: Payer: Self-pay

## 2021-05-04 DIAGNOSIS — F418 Other specified anxiety disorders: Secondary | ICD-10-CM

## 2021-05-04 MED ORDER — ALPRAZOLAM 1 MG PO TABS: ORAL_TABLET | ORAL | 3 refills | Status: DC

## 2021-05-04 NOTE — Telephone Encounter (Signed)
/  Patient is requesting a refill of the following medications: Requested Prescriptions   Pending Prescriptions Disp Refills   ALPRAZolam (XANAX) 1 MG tablet 15 tablet 3    Sig: TAKE 1/2 TABLET BY MOUTH DAILY AS NEEDED FOR ANXIETY    Date of patient request:05/04/21  /Last office visit: 02/08/21  Date of last refill: 02/07/21  Last refill amount: 15,3 Follow up time period per chart: 06/07/21  Please advise, pt states that she back at work and is driving more. Driving causes pt to have panic attacks and pt takes a whole xanax instead of the prescribed half dose. She is asking for a refill for emergency and until OV 06/07/21.

## 2021-05-04 NOTE — Telephone Encounter (Signed)
Sent pt a mychart message to set up an OV.

## 2021-05-09 ENCOUNTER — Other Ambulatory Visit: Payer: Self-pay | Admitting: Emergency Medicine

## 2021-05-09 MED ORDER — ALPRAZOLAM 0.5 MG PO TABS: 0.5000 mg | ORAL_TABLET | Freq: Two times a day (BID) | ORAL | 0 refills | Status: AC | PRN

## 2021-05-09 NOTE — Telephone Encounter (Signed)
New prescription sent. Thanks

## 2021-05-09 NOTE — Telephone Encounter (Signed)
Pleas Advise  Pt needs a new rx for Xanax, her previous renewal was denied by the state due to it being the same prescription as before.  Thank you

## 2021-05-10 NOTE — Telephone Encounter (Signed)
Sent mychart message to inform pt that a new prescription was sent.

## 2021-05-12 ENCOUNTER — Encounter: Payer: Self-pay | Admitting: Emergency Medicine

## 2021-05-22 NOTE — Telephone Encounter (Signed)
Thank you for the information.

## 2021-06-07 ENCOUNTER — Ambulatory Visit: Payer: BC Managed Care – PPO | Admitting: Emergency Medicine

## 2021-06-07 ENCOUNTER — Encounter: Payer: Self-pay | Admitting: Emergency Medicine

## 2021-06-14 ENCOUNTER — Ambulatory Visit: Payer: BC Managed Care – PPO | Admitting: Emergency Medicine

## 2021-06-14 ENCOUNTER — Encounter: Payer: Self-pay | Admitting: Emergency Medicine

## 2021-06-14 ENCOUNTER — Other Ambulatory Visit: Payer: Self-pay

## 2021-06-14 VITALS — BP 128/88 | HR 88 | Temp 97.5°F | Resp 18 | Ht 65.0 in | Wt 196.6 lb

## 2021-06-14 DIAGNOSIS — Z8669 Personal history of other diseases of the nervous system and sense organs: Secondary | ICD-10-CM

## 2021-06-14 DIAGNOSIS — F410 Panic disorder [episodic paroxysmal anxiety] without agoraphobia: Secondary | ICD-10-CM | POA: Diagnosis not present

## 2021-06-14 DIAGNOSIS — F411 Generalized anxiety disorder: Secondary | ICD-10-CM | POA: Diagnosis not present

## 2021-06-14 MED ORDER — ESCITALOPRAM OXALATE 20 MG PO TABS: 20.0000 mg | ORAL_TABLET | Freq: Every day | ORAL | 3 refills | Status: AC

## 2021-06-14 NOTE — Progress Notes (Signed)
Amy Charles 50 y.o.   Chief Complaint  Patient presents with   Panic Attack    She has been having panic attacks since she returned to work on June 2. Panic attacks are accompanied by migraines and dizziness. Son drives her everywhere. PHQ9-  GAD7-    HISTORY OF PRESENT ILLNESS: This is a 50 y.o. female with history of generalized anxiety disorder and frequent panic attacks complaining of increasing frequency for the past couple of months.  Migraine headaches triggered by panic attacks.  Presently on Lexapro 10 mg daily. Has not seen psychiatrist in the past several years.  Needs referral. No other complaints or medical concerns today. GAD 7 : Generalized Anxiety Score 06/14/2021  Nervous, Anxious, on Edge 3  Control/stop worrying 3  Worry too much - different things 3  Trouble relaxing 3  Restless 3  Easily annoyed or irritable 3  Afraid - awful might happen 3  Total GAD 7 Score 21  Anxiety Difficulty Extremely difficult    Depression screen [facility] 2/9 06/14/2021 02/08/2021 11/10/2020 10/12/2020 08/24/2020  Decreased Interest 0 0 0 0 0  Down, Depressed, Hopeless 0 0 0 0 0  PHQ - 2 Score 0 0 0 0 0  Altered sleeping 0 - - - -  Tired, decreased energy 0 - - - -  Change in appetite 0 - - - -  Feeling bad or failure about yourself  0 - - - -  Trouble concentrating 0 - - - -  Moving slowly or fidgety/restless 0 - - - -  Suicidal thoughts 0 - - - -  PHQ-9 Score 0 - - - -  Difficult doing work/chores Not difficult at all - - - -  Has history of chronic migraine headaches and sees neurological center at [facility].  Last visit as follows: Verner Chol, DO - 04/21/2021 8:30 AM EDT Formatting of this note is different from the original. Subjective  Amy Charles is 50 y.o. woman who is seen in headache follow-up on 04/21/2021. She was last seen in the clinic on 02/15/2021.  Interval medical history:  recommendations for 02/15/2021: -start amitriptyline and increase to 40 mg  at bedtime -stop Goody powder and Fioricet -Try naratriptan 2.5 mg as needed -Try Nurtec 75 mg ODT.  Since her last office visit, she is still having daily headaches. Her headache severity has lessened with amitriptyline. Her headache increases with Valsalva maneuvers. She is taking 40 mg of bedtime. She is sleeping better at night with amitriptyline. She still has insomnia (1 to 3 hours to fall asleep) and is waking up 2-3 times per night. She notes she is waking up less with amitriptyline. She previously tried Ambien and did not find it to be effective. She notes alprazolam used to help.  She is treating acute pain with naratriptan and Nurtec. She notes it helps slightly. She previously used eletriptan but it was not helpful. She is off of Fioricet. She is avoiding Goody powder but admits to taking it today due to his severe headache with nausea and vomiting.  She is continuing to have dizziness. She describes her dizziness as lightheadedness or head floating sensation. It is particularly worse when driving/riding in a car. Her son had to drive her to the office today. She is planning to return to work next week and is worried about her commute.   In addition to the above, she has tinnitus. She describes it as a buzzing sensation. Her tinnitus increases when her  headache worsens.  Longitudinal headache history:  She initially developed headaches in 2002. She denies any inciting head or neck injury or preceding illness.There is a family history of headaches in her mother due to hydrocephalus. For years, her headaches occurred a couple times per month and would last for two days. Since August, she has had a persistent headache.  She does not experience an aura. Her vision may become blurred prior to the onset of the headache. She describes her pain as a "mohawk" distribution. She describes a shooting pain in this location that also radiates into her necks. She also has shooting pain behind both  eyes. She has associated photophobia, phonophobia, nausea, vomiting (several times per week), and diarrhea (twice per week). She may have bilateral tearing with increased pain escalation. She is never headache-free. Her pain is 24/7. It is present when she wakes up in the mornings. She feels better when she lies down and has less nausea. When she stands up, she feels dizzy. She describes this as if her head feels like it is floating. Her pain increases with coughing and sneezing. She describes ringing in the ear as a constant buzz that occurs only when her pain increases. Her pain increases with bright lights and loud noises and improves in a dark/quiet room. She has not identified any triggers.  She is treating acute pain eletriptan 40 mg. This used to abort her headache within 1-2 hours. Now, it is touching her pain. She has not used the medication in one month. She is using Fioricet 1-2 tablets every six hours until she completes her supply of 15-20 tablets for the month. She is using [employer] every four to six hours. She feels it helps.   She reports poor asleep. She lies down at 10 pm. She will sit and toss and turn. If she cannot fall asleep, she will watch TV in her living room or bedroom. She usually falls asleep around 3-4 am. She is up every hour. She is out of bed between 10 am and 12 pm. She states that she snores "like a monster." She denies any witnessed apnea. She admits to bruxism (day and night). She does not wear a mouthguard. She has broken two teeth. She does not feel rested. She tries to nap all day long but cannot sleep. She is drinking 3-4 sodas throughout the day. She also drinks about six bottles of water throughout the day. She is using alprazolam 0.5 mg nightly to help anxiety for 15 days and then she runs out of the prescription.   She reports poor diet. She states she barely eats because she gets sick. She cooks at home or eats out. She is eating one meal per day (dinner). She  does not snack throughout the day. She is not exercising.   She works for [employer]. She has been out of work since November 2021. 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 She was worked up at [facility]. She was started on aspirin and Plavix,   Imaging:  MRI brain without contrast 11/15/2020:  IMPRESSION: 1. No evidence of acute infarct. 2. No evidence of intracranial mass or hemorrhage. 3. No acute intracranial abnormality. 4. Minimal nonspecific white matter foci as above.   CTA Head 11/08/2020: IMPRESSION: 1. Unchanged dilatation of the distal left cervical ICA which may represent a fusiform versus elongated saccular aneurysm. 2. No hemodynamically significant stenosis in the neck.    IR cervicocerebral arch 4 vessel  10/03/2020: Impression: Short segment 25mm dilatation of the distal left ICA with no flow limitation.   CTA neck with and without contrast 10/02/2020: 1. No intracranial large vessel occlusion or proximal high-grade arterial stenosis.  2. Minimal non-stenotic calcified plaque within the intracranial left ICA.   CTA head with and without contrast on 10/02/2020: 1. Findings consistent with age-indeterminate dissection of the  distal cervical left ICA. Associated aneurysm at this site which is  predominantly fusiform, but also with a 3 mm saccular component. No  significant vessel stenosis.  2. The right common and internal carotid arteries, and bilateral  vertebral arteries, are patent within the neck without significant  stenosis.   Current headache relevant medications: amitriptyline 40 mg, naratriptan 2.5 mg as needed, Nurtec 75 mg as needed, Goody powder as needed Other relevant medications: lisinopril 40 mg (prescribed for HTN), escitalopram 10 mg (prescribed for anxiety)  Previous headache preventative agents: topiramate X 2 days (syncope), gabapentin (prescribed for paresthesia in legs), lisinopril, escitalopram 10  mg Sleep: alprazolam prn, Ambien (does not help)  Previous headache abortive agents: ibuprofen, naproxen, Goody Powder, Fioricet, rizatriptan (ineffective), sumatriptan (syncope), eletritpan, tramadol  Other treatment modalities: tens unit (current, uses on neck and shoulders)   Past Medical History:  Diagnosis Date   Anxiety   Carotid aneurysm, left (CMS-HCC)   Cervical cancer (CMS-HCC)   Hypertension   Insomnia   Internal carotid artery dissection (CMS-HCC)  left, age indeterminate, found on imaging from 09/2020   Migraine headache  Assessment :  Amy Charles is seen with episodic migraine without aura that began in 2002. She has had an intractable headache since August 2021. The etiology of her headache is likely secondary to chronic migraine and vestibular migraine.   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  Recommendations: We discussed the above at length. All questions were answered. She will increase amitriptyline to 50 mg. In addition, I recommend starting zonisamide 100 mg and increasing to 200 mg at bedtime. I reviewed common side effects. If zonisamide does not help, I will plan to start [place]. She will contact the office with an update in 1 month. She is to continue to treat acute pain with naratriptan or Nurtec. She is return to the clinic in 2 months or sooner if necessary   Attending Attestation:   I personally performed the service. (TP)  Verner Chol, DO  Electronically signed by Verner Chol, DO at 04/21/2021 12:40 PM EDT    HPI   Prior to Admission medications   Medication Sig Start Date End Date Taking? Authorizing Provider  ALPRAZolam Duanne Moron) 0.5 MG tablet Take 1 tablet (0.5 mg total) by mouth 2 (two) times daily as needed for anxiety. 05/09/21   Horald Pollen, MD  amitriptyline (ELAVIL) 10 MG tablet Take 1 po q HS X wk, then 2 po q  HS X 1 wk, then 3 po q HS X 1 wk, and then 4 po q HS thereafter 02/15/21   [provider]  aspirin 81 MG EC tablet Take by mouth. 10/03/20 10/03/21  [provider]  baclofen (LIORESAL) 10 MG tablet Take 0.5-1 tablets (5-10 mg total) by mouth 3 (three) times daily as needed for muscle spasms. 03/02/21   Hilts, Legrand Como, MD  celecoxib (CELEBREX) 200 MG capsule Take 1 capsule (200 mg total) by mouth 2 (two) times daily as needed. 03/02/21   Hilts, Legrand Como, MD  clopidogrel (PLAVIX) 75  MG tablet Take by mouth. 10/04/20 10/04/21  [provider]  escitalopram (LEXAPRO) 10 MG tablet Take 1 tablet (10 mg total) by mouth daily. 07/07/20 10/05/20  Horald Pollen, MD  levocetirizine (XYZAL) 5 MG tablet Take 1 tablet (5 mg total) by mouth every evening. 06/19/18   Jaynee Eagles, PA-C  lisinopril (ZESTRIL) 40 MG tablet Take 1 tablet (40 mg total) by mouth daily. 10/12/20   Horald Pollen, MD  naratriptan (AMERGE) 2.5 MG tablet Take 1 tab at onset of migraine. May repeat in 2 hours if needed. Max 2/day and 2-3 days/wk. Patient not taking: Reported on 03/02/2021 02/15/21   [provider]  Rimegepant Sulfate (NURTEC) 75 MG TBDP Take by mouth.    [provider]    Allergies  Allergen Reactions   Topiramate     Other reaction(s): Syncope   Augmentin [Amoxicillin-Pot Clavulanate] Nausea And Vomiting   Sumatriptan     convulsions   Tetracyclines & Related     Patient Active Problem List   Diagnosis Date Noted   Internal carotid artery dissection ([place]) 02/08/2021   Intractable chronic migraine without aura and without status migrainosus 07/20/2020   Chronic diarrhea 07/18/2018   Situational anxiety 07/18/2018   Essential hypertension 05/14/2018   Generalized anxiety disorder 05/14/2018   Chronic daily headache 05/18/2012    Past Medical History:  Diagnosis Date   Allergy    Anxiety    Cancer ([place])    Depression    Gallstones    GERD (gastroesophageal  reflux disease)    IBS (irritable bowel syndrome)    Migraines     Past Surgical History:  Procedure Laterality Date   BREAST BIOPSY     CHOLECYSTECTOMY N/A 12/25/2017   Procedure: LAPAROSCOPIC CHOLECYSTECTOMY;  Surgeon: Olean Ree, MD;  Location: [facility] ORS;  Service: General;  Laterality: N/A;   NOVASURE ABLATION     TUBAL LIGATION      Social History   Socioeconomic History   Marital status: Married    Spouse name: Not on file   Number of children: Not on file   Years of education: Not on file   Highest education level: Not on file  Occupational History   Not on file  Tobacco Use   Smoking status: Every Day    Packs/day: 0.50    Years: 30.00    Pack years: 15.00    Types: Cigarettes   Smokeless tobacco: Never   Tobacco comments:    off/on  Substance and Sexual Activity   Alcohol use: No   Drug use: No   Sexual activity: Not on file  Other Topics Concern   Not on file  Social History Narrative   Not on file   Social Determinants of Health   Financial Resource Strain: Not on file  Food Insecurity: Not on file  Transportation Needs: Not on file  Physical Activity: Not on file  Stress: Not on file  Social Connections: Not on file  Intimate Partner Violence: Not on file    Family History  Problem Relation Age of Onset   Diabetes Mother    Heart disease Mother    Hyperlipidemia Mother    Hypertension Mother    Heart disease Father    Hyperlipidemia Father    Hypertension Father    Cancer Father    Hyperlipidemia Brother    Hypertension Brother    Hyperlipidemia Brother    Hypertension Brother      Review of Systems  Constitutional: Negative.  Negative for chills and fever.  HENT: Negative.  Negative for congestion and sore throat.   Respiratory: Negative.  Negative for cough and shortness of breath.   Cardiovascular: Negative.  Negative for chest pain and palpitations.  Gastrointestinal: Negative.  Negative for abdominal pain, diarrhea, nausea  and vomiting.  Genitourinary: Negative.   Musculoskeletal: Negative.   Skin: Negative.  Negative for rash.  Neurological:  Positive for headaches.  Psychiatric/Behavioral:  The patient is nervous/anxious.    Today's Vitals   06/14/21 0955  BP: 128/88  Pulse: 88  Resp: 18  Temp: (!) 97.5 F (36.4 C)  TempSrc: Oral  SpO2: 98%  Weight: 196 lb 9.6 oz (89.2 kg)  Height: 5\' 5"  (1.651 m)   Body mass index is 32.72 kg/m.  Physical Exam Vitals reviewed.  Constitutional:      Appearance: Normal appearance.  HENT:     Head: Normocephalic.  Eyes:     Extraocular Movements: Extraocular movements intact.     Pupils: Pupils are equal, round, and reactive to light.  Cardiovascular:     Rate and Rhythm: Normal rate and regular rhythm.  Pulmonary:     Effort: Pulmonary effort is normal.     Breath sounds: Normal breath sounds.  Musculoskeletal:        General: Normal range of motion.     Cervical back: Normal range of motion.     Right lower leg: No edema.     Left lower leg: No edema.  Skin:    General: Skin is warm and dry.     Capillary Refill: Capillary refill takes less than 2 seconds.  Neurological:     General: No focal deficit present.     Mental Status: She is alert and oriented to person, place, and time.  Psychiatric:        Mood and Affect: Mood normal.        Behavior: Behavior normal.     ASSESSMENT & PLAN: A total of 30 minutes was spent with the patient and counseling/coordination of care regarding preparing for this visit, review of most recent office visit notes, review of all medications, generalized anxiety disorder management, need to increase dose of Lexapro to 20 mg daily, need for psychiatric evaluation, prognosis, documentation and need for follow-up.  Generalized anxiety disorder Currently active and affecting quality of life.  Needs psychiatric evaluation.  Referral placed today. Will increase Lexapro to 20 mg daily.  Ne[place]ah was seen today for panic  attack.  Diagnoses and all orders for this visit:  Generalized anxiety disorder -     escitalopram (LEXAPRO) 20 MG tablet; Take 1 tablet (20 mg total) by mouth daily. -     Ambulatory referral to Psychiatry  Panic disorder -     escitalopram (LEXAPRO) 20 MG tablet; Take 1 tablet (20 mg total) by mouth daily. -     Ambulatory referral to Psychiatry  History of migraine headaches  Patient Instructions  [email or web address]  Generalized Anxiety Disorder, Adult Generalized anxiety disorder (GAD) is a mental health condition. Unlike normal worries, anxiety related to GAD is not triggered by a specific event. These worries do not fade or get better with time. GAD interferes with relationships,work, and school. GAD symptoms can vary from mild to severe. People with severe GAD can have intense waves of anxiety with physical symptoms that are similar to panicattacks. What are the causes? The exact cause of GAD is not known, but the following are believed to have an impact: Differences in  natural brain chemicals. Genes passed down from parents to children. Differences in the way threats are perceived. Development during childhood. Personality. What increases the risk? The following factors may make you more likely to develop this condition: Being female. Having a family history of anxiety disorders. Being very shy. Experiencing very stressful life events, such as the death of a loved one. Having a very stressful family environment. What are the signs or symptoms? People with GAD often worry excessively about many things in their lives, such as their health and family. Symptoms may also include: Mental and emotional symptoms: Worrying excessively about natural disasters. Fear of being late. Difficulty concentrating. Fears that others are judging your performance. Physical symptoms: Fatigue. Headaches, muscle tension, muscle twitches, trembling, or feeling shaky. Feeling like your  heart is pounding or beating very fast. Feeling out of breath or like you cannot take a deep breath. Having trouble falling asleep or staying asleep, or experiencing restlessness. Sweating. Nausea, diarrhea, or irritable bowel syndrome (IBS). Behavioral symptoms: Experiencing erratic moods or irritability. Avoidance of new situations. Avoidance of people. Extreme difficulty making decisions. How is this diagnosed? This condition is diagnosed based on your symptoms and medical history. You will also have a physical exam. Your health care provider may perform tests torule out other possible causes of your symptoms. To be diagnosed with GAD, a person must have anxiety that: Is out of his or her control. Affects several different aspects of his or her life, such as work and relationships. Causes distress that makes him or her unable to take part in normal activities. Includes at least three symptoms of GAD, such as restlessness, fatigue, trouble concentrating, irritability, muscle tension, or sleep problems. Before your health care provider can confirm a diagnosis of GAD, these symptoms must be present more days than they are not, and they must last for 6 months orlonger. How is this treated? This condition may be treated with: Medicine. Antidepressant medicine is usually prescribed for long-term daily control. Anti-anxiety medicines may be added in severe cases, especially when panic attacks occur. Talk therapy (psychotherapy). Certain types of talk therapy can be helpful in treating GAD by providing support, education, and guidance. Options include: Cognitive behavioral therapy (CBT). People learn coping skills and self-calming techniques to ease their physical symptoms. They learn to identify unrealistic thoughts and behaviors and to replace them with more appropriate thoughts and behaviors. Acceptance and commitment therapy (ACT). This treatment teaches people how to be mindful as a way to  cope with unwanted thoughts and feelings. Biofeedback. This process trains you to manage your body's response (physiological response) through breathing techniques and relaxation methods. You will work with a therapist while machines are used to monitor your physical symptoms. Stress management techniques. These include yoga, meditation, and exercise. A mental health specialist can help determine which treatment is best for you. Some people see improvement with one type of therapy. However, other peoplerequire a combination of therapies. Follow these instructions at home: Lifestyle Maintain a consistent routine and schedule. Anticipate stressful situations. Create a plan, and allow extra time to work with your plan. Practice stress management or self-calming techniques that you have learned from your therapist or your health care provider. General instructions Take over-the-counter and prescription medicines only as told by your health care provider. Understand that you are likely to have setbacks. Accept this and be kind to yourself as you persist to take better care of yourself. Recognize and accept your accomplishments, even if you judge them as  small. Keep all follow-up visits as told by your health care provider. This is important. Contact a health care provider if: Your symptoms do not get better. Your symptoms get worse. You have signs of depression, such as: A persistently sad or irritable mood. Loss of enjoyment in activities that used to bring you joy. Change in weight or eating. Changes in sleeping habits. Avoiding friends or family members. Loss of energy for normal tasks. Feelings of guilt or worthlessness. Get help right away if: You have serious thoughts about hurting yourself or others. If you ever feel like you may hurt yourself or others, or have thoughts about taking your own life, get help right away. Go to your nearest emergency department or: Call your local  emergency services (911 in the [place] Call a suicide crisis helpline, such as the [place] at (773) 487-5750. This is open 24 hours a day in the [place] Text the Crisis Text Line at (929)574-6235 (in the [place].). Summary Generalized anxiety disorder (GAD) is a mental health condition that involves worry that is not triggered by a specific event. People with GAD often worry excessively about many things in their lives, such as their health and family. GAD may cause symptoms such as restlessness, trouble concentrating, sleep problems, frequent sweating, nausea, diarrhea, headaches, and trembling or muscle twitching. A mental health specialist can help determine which treatment is best for you. Some people see improvement with one type of therapy. However, other people require a combination of therapies. This information is not intended to replace advice given to you by your health care provider. Make sure you discuss any questions you have with your healthcare provider. Document Revised: 09/02/2019 Document Reviewed: 09/02/2019 Elsevier Patient Education  2022 [place], MD [facility]

## 2021-06-14 NOTE — Assessment & Plan Note (Signed)
Currently active and affecting quality of life.  Needs psychiatric evaluation.  Referral placed today. Will increase Lexapro to 20 mg daily.

## 2021-06-14 NOTE — Patient Instructions (Signed)

## 2021-06-28 ENCOUNTER — Other Ambulatory Visit: Payer: Self-pay

## 2021-06-28 ENCOUNTER — Ambulatory Visit: Payer: BC Managed Care – PPO | Admitting: Emergency Medicine

## 2021-06-28 ENCOUNTER — Encounter: Payer: Self-pay | Admitting: Emergency Medicine

## 2021-06-28 VITALS — BP 132/70 | HR 95 | Temp 98.3°F | Ht 65.0 in | Wt 195.0 lb

## 2021-06-28 DIAGNOSIS — Z8669 Personal history of other diseases of the nervous system and sense organs: Secondary | ICD-10-CM | POA: Diagnosis not present

## 2021-06-28 DIAGNOSIS — G43111 Migraine with aura, intractable, with status migrainosus: Secondary | ICD-10-CM | POA: Diagnosis not present

## 2021-06-28 MED ORDER — TRAMADOL HCL 50 MG PO TABS: 50.0000 mg | ORAL_TABLET | Freq: Three times a day (TID) | ORAL | 0 refills | Status: AC | PRN

## 2021-06-28 NOTE — Progress Notes (Signed)
Amy Charles 50 y.o.   Chief Complaint  Patient presents with   Migraine    HISTORY OF PRESENT ILLNESS: This is a 50 y.o. female complaining of bad migraine headache for the last week.  Typical symptoms.  Waxing and waning headache. Lowest has been a 5 out of 10.  Presently 9 out of 10.  Nauseous but no vomiting.  Some diarrhea.  Denies fever or flulike symptoms. Not sleeping well.  Ran out of Nurtec. No other significant symptoms.  HPI   Prior to Admission medications   Medication Sig Start Date End Date Taking? Authorizing Provider  ALPRAZolam Duanne Moron) 0.5 MG tablet Take 1 tablet (0.5 mg total) by mouth 2 (two) times daily as needed for anxiety. 05/09/21  Yes Horald Pollen, MD  aspirin 81 MG EC tablet Take by mouth. 10/03/20 10/03/21 Yes [provider]  baclofen (LIORESAL) 10 MG tablet Take 0.5-1 tablets (5-10 mg total) by mouth 3 (three) times daily as needed for muscle spasms. 03/02/21  Yes Hilts, Legrand Como, MD  celecoxib (CELEBREX) 200 MG capsule Take 1 capsule (200 mg total) by mouth 2 (two) times daily as needed. 03/02/21  Yes Hilts, Legrand Como, MD  clopidogrel (PLAVIX) 75 MG tablet Take by mouth. 10/04/20 10/04/21 Yes [provider]  escitalopram (LEXAPRO) 20 MG tablet Take 1 tablet (20 mg total) by mouth daily. 06/14/21 09/12/21 Yes Rudie Sermons, Ines Bloomer, MD  levocetirizine (XYZAL) 5 MG tablet Take 1 tablet (5 mg total) by mouth every evening. 06/19/18  Yes Jaynee Eagles, PA-C  lisinopril (ZESTRIL) 40 MG tablet Take 1 tablet (40 mg total) by mouth daily. 10/12/20  Yes Horald Pollen, MD  naratriptan [facility]) 2.5 MG tablet  02/15/21  Yes [provider]  Rimegepant Sulfate (NURTEC) 75 MG TBDP Take by mouth.   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[place]) 02/08/2021   Intractable chronic migraine without aura and without status migrainosus 07/20/2020   Chronic diarrhea 07/18/2018   Situational anxiety 07/18/2018   Essential hypertension 05/14/2018   Generalized anxiety disorder 05/14/2018   Chronic daily headache 05/18/2012    Past Medical History:  Diagnosis Date   Allergy    Anxiety    Cancer ([place])    Depression    Gallstones    GERD (gastroesophageal reflux disease)    IBS (irritable bowel syndrome)    Migraines     Past Surgical History:  Procedure Laterality Date   BREAST BIOPSY     CHOLECYSTECTOMY N/A 12/25/2017   Procedure: LAPAROSCOPIC CHOLECYSTECTOMY;  Surgeon: Olean Ree, MD;  Location: [facility] ORS;  Service: General;  Laterality: N/A;   NOVASURE ABLATION     TUBAL LIGATION      Social History   Socioeconomic History   Marital status: Married    Spouse name: Not on file   Number of children: Not on file   Years of education: Not on file   Highest education level: Not on file  Occupational History   Not on file  Tobacco Use   Smoking status: Every Day    Packs/day: 0.50    Years: 30.00    Pack years: 15.00  Types: Cigarettes   Smokeless tobacco: Never   Tobacco comments:    off/on  Substance and Sexual Activity   Alcohol use: No   Drug use: No   Sexual activity: Not on file  Other Topics Concern   Not on file  Social History Narrative   Not on file   Social Determinants of Health   Financial Resource Strain: Not on file  Food Insecurity: Not on file  Transportation Needs: Not on file  Physical Activity: Not on file  Stress: Not on file  Social Connections: Not on file  Intimate Partner Violence: Not on file    Family History  Problem Relation Age of Onset   Diabetes Mother    Heart disease Mother    Hyperlipidemia Mother    Hypertension Mother    Heart disease Father     Hyperlipidemia Father    Hypertension Father    Cancer Father    Hyperlipidemia Brother    Hypertension Brother    Hyperlipidemia Brother    Hypertension Brother      Review of Systems  Constitutional: Negative.  Negative for chills and fever.  HENT: Negative.  Negative for congestion and sore throat.   Eyes:  Positive for photophobia. Negative for blurred vision, double vision and pain.  Respiratory: Negative.  Negative for cough and shortness of breath.   Cardiovascular:  Negative for chest pain and palpitations.  Gastrointestinal:  Positive for nausea. Negative for abdominal pain, diarrhea and vomiting.  Genitourinary: Negative.   Skin: Negative.  Negative for rash.  Neurological:  Positive for headaches. Negative for dizziness, sensory change, speech change and focal weakness.  All other systems reviewed and are negative.   Physical Exam Vitals reviewed.  Constitutional:      Appearance: Normal appearance.  HENT:     Head: Normocephalic.  Eyes:     Extraocular Movements: Extraocular movements intact.     Conjunctiva/sclera: Conjunctivae normal.     Pupils: Pupils are equal, round, and reactive to light.  Cardiovascular:     Rate and Rhythm: Normal rate and regular rhythm.     Pulses: Normal pulses.     Heart sounds: Normal heart sounds.  Pulmonary:     Effort: Pulmonary effort is normal.     Breath sounds: Normal breath sounds.  Musculoskeletal:        General: Normal range of motion.     Cervical back: Normal range of motion and neck supple.  Skin:    General: Skin is warm and dry.     Capillary Refill: Capillary refill takes less than 2 seconds.  Neurological:     General: No focal deficit present.     Mental Status: She is alert and oriented to person, place, and time.     Sensory: No sensory deficit.     Motor: No weakness.     Coordination: Coordination normal.     Gait: Gait normal.  Psychiatric:        Mood and Affect: Mood normal.        Behavior:  Behavior normal.     ASSESSMENT & PLAN: Dalea was seen today for migraine.  Diagnoses and all orders for this visit:  Intractable migraine with aura with status migrainosus -     traMADol (ULTRAM) 50 MG tablet; Take 1 tablet (50 mg total) by mouth every 8 (eight) hours as needed for up to 5 days.  History of migraine headaches  Patient Instructions  Migraine Headache A migraine headache is a  very strong throbbing pain on one side or both sides of your head. This type of headache can also cause other symptoms. It can last from 4 hours to 3 days. Talk with your doctor about what things may bring on (trigger) this condition. What are the causes? The exact cause of this condition is not known. This condition may be triggered or caused by: Drinking alcohol. Smoking. Taking medicines, such as: Medicine used to treat chest pain (nitroglycerin). Birth control pills. Estrogen. Some blood pressure medicines. Eating or drinking certain products. Doing physical activity. Other things that may trigger a migraine headache include: Having a menstrual period. Pregnancy. Hunger. Stress. Not getting enough sleep or getting too much sleep. Weather changes. Tiredness (fatigue). What increases the risk? Being 57-72 years old. Being female. Having a family history of migraine headaches. Being Caucasian. Having depression or anxiety. Being very overweight. What are the signs or symptoms? A throbbing pain. This pain may: Happen in any area of the head, such as on one side or both sides. Make it hard to do daily activities. Get worse with physical activity. Get worse around bright lights or loud noises. Other symptoms may include: Feeling sick to your stomach (nauseous). Vomiting. Dizziness. Being sensitive to bright lights, loud noises, or smells. Before you get a migraine headache, you may get warning signs (an aura). An aura may include: Seeing flashing lights or having blind  spots. Seeing bright spots, halos, or zigzag lines. Having tunnel vision or blurred vision. Having numbness or a tingling feeling. Having trouble talking. Having weak muscles. Some people have symptoms after a migraine headache (postdromal phase), such as: Tiredness. Trouble thinking (concentrating). How is this treated? Taking medicines that: Relieve pain. Relieve the feeling of being sick to your stomach. Prevent migraine headaches. Treatment may also include: Having acupuncture. Avoiding foods that bring on migraine headaches. Learning ways to control your body functions (biofeedback). Therapy to help you know and deal with negative thoughts (cognitive behavioral therapy). Follow these instructions at home: Medicines Take over-the-counter and prescription medicines only as told by your doctor. Ask your doctor if the medicine prescribed to you: Requires you to avoid driving or using heavy machinery. Can cause trouble pooping (constipation). You may need to take these steps to prevent or treat trouble pooping: Drink enough fluid to keep your pee (urine) pale yellow. Take over-the-counter or prescription medicines. Eat foods that are high in fiber. These include beans, whole grains, and fresh fruits and vegetables. Limit foods that are high in fat and sugar. These include fried or sweet foods. Lifestyle Do not drink alcohol. Do not use any products that contain nicotine or tobacco, such as cigarettes, e-cigarettes, and chewing tobacco. If you need help quitting, ask your doctor. Get at least 8 hours of sleep every night. Limit and deal with stress. General instructions     Keep a journal to find out what may bring on your migraine headaches. For example, write down: What you eat and drink. How much sleep you get. Any change in what you eat or drink. Any change in your medicines. If you have a migraine headache: Avoid things that make your symptoms worse, such as bright  lights. It may help to lie down in a dark, quiet room. Do not drive or use heavy machinery. Ask your doctor what activities are safe for you. Keep all follow-up visits as told by your doctor. This is important. Contact a doctor if: You get a migraine headache that is different or worse  than others you have had. You have more than 15 headache days in one month. Get help right away if: Your migraine headache gets very bad. Your migraine headache lasts longer than 72 hours. You have a fever. You have a stiff neck. You have trouble seeing. Your muscles feel weak or like you cannot control them. You start to lose your balance a lot. You start to have trouble walking. You pass out (faint). You have a seizure. Summary A migraine headache is a very strong throbbing pain on one side or both sides of your head. These headaches can also cause other symptoms. This condition may be treated with medicines and changes to your lifestyle. Keep a journal to find out what may bring on your migraine headaches. Contact a doctor if you get a migraine headache that is different or worse than others you have had. Contact your doctor if you have more than 15 headache days in a month. This information is not intended to replace advice given to you by your health care provider. Make sure you discuss any questions you have with your healthcare provider. Document Revised: 03/06/2019 Document Reviewed: 12/25/2018 Elsevier Patient Education  2022 [place], MD [facility]

## 2021-06-28 NOTE — Patient Instructions (Signed)
Migraine Headache A migraine headache is a very strong throbbing pain on one side or both sides of your head. This type of headache can also cause other symptoms. It can last from 4 hours to 3 days. Talk with your doctor about what things may bring on (trigger) this condition. What are the causes? The exact cause of this condition is not known. This condition may be triggered or caused by: Drinking alcohol. Smoking. Taking medicines, such as: Medicine used to treat chest pain (nitroglycerin). Birth control pills. Estrogen. Some blood pressure medicines. Eating or drinking certain products. Doing physical activity. Other things that may trigger a migraine headache include: Having a menstrual period. Pregnancy. Hunger. Stress. Not getting enough sleep or getting too much sleep. Weather changes. Tiredness (fatigue). What increases the risk? Being 25-55 years old. Being female. Having a family history of migraine headaches. Being Caucasian. Having depression or anxiety. Being very overweight. What are the signs or symptoms? A throbbing pain. This pain may: Happen in any area of the head, such as on one side or both sides. Make it hard to do daily activities. Get worse with physical activity. Get worse around bright lights or loud noises. Other symptoms may include: Feeling sick to your stomach (nauseous). Vomiting. Dizziness. Being sensitive to bright lights, loud noises, or smells. Before you get a migraine headache, you may get warning signs (an aura). An aura may include: Seeing flashing lights or having blind spots. Seeing bright spots, halos, or zigzag lines. Having tunnel vision or blurred vision. Having numbness or a tingling feeling. Having trouble talking. Having weak muscles. Some people have symptoms after a migraine headache (postdromal phase), such as: Tiredness. Trouble thinking (concentrating). How is this treated? Taking medicines that: Relieve  pain. Relieve the feeling of being sick to your stomach. Prevent migraine headaches. Treatment may also include: Having acupuncture. Avoiding foods that bring on migraine headaches. Learning ways to control your body functions (biofeedback). Therapy to help you know and deal with negative thoughts (cognitive behavioral therapy). Follow these instructions at home: Medicines Take over-the-counter and prescription medicines only as told by your doctor. Ask your doctor if the medicine prescribed to you: Requires you to avoid driving or using heavy machinery. Can cause trouble pooping (constipation). You may need to take these steps to prevent or treat trouble pooping: Drink enough fluid to keep your pee (urine) pale yellow. Take over-the-counter or prescription medicines. Eat foods that are high in fiber. These include beans, whole grains, and fresh fruits and vegetables. Limit foods that are high in fat and sugar. These include fried or sweet foods. Lifestyle Do not drink alcohol. Do not use any products that contain nicotine or tobacco, such as cigarettes, e-cigarettes, and chewing tobacco. If you need help quitting, ask your doctor. Get at least 8 hours of sleep every night. Limit and deal with stress. General instructions   Keep a journal to find out what may bring on your migraine headaches. For example, write down: What you eat and drink. How much sleep you get. Any change in what you eat or drink. Any change in your medicines. If you have a migraine headache: Avoid things that make your symptoms worse, such as bright lights. It may help to lie down in a dark, quiet room. Do not drive or use heavy machinery. Ask your doctor what activities are safe for you. Keep all follow-up visits as told by your doctor. This is important. Contact a doctor if: You get a migraine headache that   is different or worse than others you have had. You have more than 15 headache days in one  month. Get help right away if: Your migraine headache gets very bad. Your migraine headache lasts longer than 72 hours. You have a fever. You have a stiff neck. You have trouble seeing. Your muscles feel weak or like you cannot control them. You start to lose your balance a lot. You start to have trouble walking. You pass out (faint). You have a seizure. Summary A migraine headache is a very strong throbbing pain on one side or both sides of your head. These headaches can also cause other symptoms. This condition may be treated with medicines and changes to your lifestyle. Keep a journal to find out what may bring on your migraine headaches. Contact a doctor if you get a migraine headache that is different or worse than others you have had. Contact your doctor if you have more than 15 headache days in a month. This information is not intended to replace advice given to you by your health care provider. Make sure you discuss any questions you have with your health care provider. Document Revised: 03/06/2019 Document Reviewed: 12/25/2018 Elsevier Patient Education  2022 [employer]  

## 2021-07-08 ENCOUNTER — Other Ambulatory Visit: Payer: Self-pay | Admitting: Emergency Medicine

## 2021-07-08 DIAGNOSIS — F418 Other specified anxiety disorders: Secondary | ICD-10-CM

## 2021-07-10 ENCOUNTER — Encounter: Payer: Self-pay | Admitting: Emergency Medicine

## 2021-07-11 ENCOUNTER — Encounter: Payer: Self-pay | Admitting: Emergency Medicine

## 2021-07-11 NOTE — Telephone Encounter (Signed)
Form signed. Thanks!

## 2021-07-12 NOTE — Telephone Encounter (Signed)
Faxed forms 07/11/21 and 07/12/21 waiting for conformation.

## 2021-07-19 ENCOUNTER — Other Ambulatory Visit: Payer: Self-pay | Admitting: Family Medicine

## 2021-07-19 ENCOUNTER — Other Ambulatory Visit: Payer: Self-pay | Admitting: Emergency Medicine

## 2021-07-19 DIAGNOSIS — F418 Other specified anxiety disorders: Secondary | ICD-10-CM

## 2021-07-20 ENCOUNTER — Other Ambulatory Visit: Payer: Self-pay

## 2021-07-20 ENCOUNTER — Emergency Department: Payer: BC Managed Care – PPO

## 2021-07-20 ENCOUNTER — Emergency Department
Admission: EM | Admit: 2021-07-20 | Discharge: 2021-07-20 | Disposition: A | Discharge status: Home / Self Care | Payer: BC Managed Care – PPO | Attending: Student in an Organized Health Care Education/Training Program | Admitting: Student in an Organized Health Care Education/Training Program

## 2021-07-20 ENCOUNTER — Encounter: Payer: Self-pay | Admitting: Emergency Medicine

## 2021-07-20 DIAGNOSIS — R519 Headache, unspecified: Secondary | ICD-10-CM | POA: Diagnosis not present

## 2021-07-20 DIAGNOSIS — I10 Essential (primary) hypertension: Secondary | ICD-10-CM | POA: Diagnosis not present

## 2021-07-20 DIAGNOSIS — Z7902 Long term (current) use of antithrombotics/antiplatelets: Secondary | ICD-10-CM | POA: Insufficient documentation

## 2021-07-20 DIAGNOSIS — Z7982 Long term (current) use of aspirin: Secondary | ICD-10-CM | POA: Insufficient documentation

## 2021-07-20 DIAGNOSIS — G43901 Migraine, unspecified, not intractable, with status migrainosus: Secondary | ICD-10-CM | POA: Insufficient documentation

## 2021-07-20 DIAGNOSIS — F17210 Nicotine dependence, cigarettes, uncomplicated: Secondary | ICD-10-CM | POA: Insufficient documentation

## 2021-07-20 MED ORDER — SODIUM CHLORIDE 0.9 % IV BOLUS
1000.0000 mL | Freq: Once | INTRAVENOUS | Status: AC
  Administered 2021-07-20: 1000 mL via INTRAVENOUS

## 2021-07-20 MED ORDER — DROPERIDOL 2.5 MG/ML IJ SOLN
2.5000 mg | Freq: Once | INTRAMUSCULAR | Status: AC
  Administered 2021-07-20: 2.5 mg via INTRAVENOUS
  Filled 2021-07-20: qty 2

## 2021-07-20 NOTE — Discharge Instructions (Addendum)

## 2021-07-20 NOTE — ED Triage Notes (Signed)
Pt via POV from home. Pt c/o migraine pt states since May. Pt states she just couldn't take it anymore and her Neurologist told her to come the the ER. Pt is on prescription medications with no relief. Pt c/o nausea, light and photosensitivity. Pt is A&Ox4 and NAD.

## 2021-07-20 NOTE — ED Provider Notes (Signed)
[facility] Emergency Department Provider Note    Event Date/Time   First MD Initiated Contact with Patient 07/20/21 1628     (approximate)  I have reviewed the triage vital signs and the nursing notes.   HISTORY  Chief Complaint Migraine    HPI Amy Charles is a 50 y.o. female presents to the ER for evaluation of persistent headache over the past several months.  Has been taking medications at home without improvement.  Has had more than 5 days of persistent daily migraine headache consistent with her previous migraines.  Is having photophobia and nausea.  No numbness or tingling.  No sudden onset headache.  No meningismus.  Past Medical History:  Diagnosis Date   Allergy    Anxiety    Cancer ([place])    Depression    Gallstones    GERD (gastroesophageal reflux disease)    IBS (irritable bowel syndrome)    Migraines    Family History  Problem Relation Age of Onset   Diabetes Mother    Heart disease Mother    Hyperlipidemia Mother    Hypertension Mother    Heart disease Father    Hyperlipidemia Father    Hypertension Father    Cancer Father    Hyperlipidemia Brother    Hypertension Brother    Hyperlipidemia Brother    Hypertension Brother    Past Surgical History:  Procedure Laterality Date   BREAST BIOPSY     CHOLECYSTECTOMY N/A 12/25/2017   Procedure: LAPAROSCOPIC CHOLECYSTECTOMY;  Surgeon: Olean Ree, MD;  Location: [facility] ORS;  Service: General;  Laterality: N/A;   [place]     Patient Active Problem List   Diagnosis Date Noted   Panic disorder 06/14/2021   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 Authorizing Provider  ALPRAZolam Duanne Moron) 0.5 MG tablet Take 1 tablet (0.5 mg total) by mouth 2 (two) times daily as needed for anxiety. 05/09/21   Horald Pollen, MD  aspirin 81 MG EC tablet Take by mouth. 10/03/20 10/03/21  [provider]  baclofen (LIORESAL) 10 MG tablet TAKE 0.5-1 TABLETS BY MOUTH 3 TIMES DAILY AS NEEDED FOR MUSCLE SPASMS. 07/20/21   Hilts, Legrand Como, MD  celecoxib (CELEBREX) 200 MG capsule Take 1 capsule (200 mg total) by mouth 2 (two) times daily as needed. 03/02/21   Hilts, Legrand Como, MD  clopidogrel (PLAVIX) 75 MG tablet Take by mouth. 10/04/20 10/04/21  [provider]  escitalopram (LEXAPRO) 20 MG tablet Take 1 tablet (20 mg total) by mouth daily. 06/14/21 09/12/21  Horald Pollen, MD  levocetirizine (XYZAL) 5 MG tablet Take 1 tablet (5 mg total) by mouth every evening. 06/19/18   Jaynee Eagles, PA-C  lisinopril (ZESTRIL) 40 MG tablet Take 1 tablet (40 mg total) by mouth daily. 10/12/20   Horald Pollen, MD  naratriptan [facility]) 2.5 MG tablet  02/15/21   [provider]  Rimegepant Sulfate (NURTEC) 75 MG TBDP Take by mouth.    [provider]    Allergies Topiramate, Augmentin [amoxicillin-pot clavulanate], Sumatriptan, and Tetracyclines & related    Social History Social History  Tobacco Use   Smoking status: Every Day    Packs/day: 0.50    Years: 30.00    Pack years: 15.00    Types: Cigarettes   Smokeless tobacco: Never   Tobacco comments:    off/on  Substance Use Topics   Alcohol use: No   Drug use: No    Review of Systems Patient denies headaches, rhinorrhea, blurry vision, numbness, shortness of breath, chest pain, edema, cough, abdominal pain, nausea, vomiting, diarrhea, dysuria, fevers, rashes or hallucinations unless otherwise stated above in HPI. ____________________________________________   PHYSICAL EXAM:  VITAL SIGNS: Vitals:   07/20/21  1454 07/20/21 1804  BP: 134/86 126/78  Pulse: 64 90  Resp: 20 16  Temp: 99.1 F (37.3 C)   SpO2: 100% 97%    Constitutional: Alert and oriented.  Eyes: Conjunctivae are normal.  Head: Atraumatic. Nose: No congestion/rhinnorhea. Mouth/Throat: Mucous membranes are moist.   Neck: No stridor. Painless ROM.  Cardiovascular: Normal rate, regular rhythm. Grossly normal heart sounds.  Good peripheral circulation. Respiratory: Normal respiratory effort.  No retractions. Lungs CTAB. Gastrointestinal: Soft and nontender. No distention. No abdominal bruits. No CVA tenderness. Genitourinary:  Musculoskeletal: No lower extremity tenderness nor edema.  No joint effusions. Neurologic:  Normal speech and language. No gross focal neurologic deficits are appreciated. No facial droop Skin:  Skin is warm, dry and intact. No rash noted. Psychiatric: Mood and affect are normal. Speech and behavior are normal.  ____________________________________________   LABS (all labs ordered are listed, but only abnormal results are displayed)  No results found for this or any previous visit (from the past 24 hour(s)). ___________________________________________________ ED ECG REPORT I, Merlyn Lot, the attending physician, personally viewed and interpreted this ECG.   Date: 07/20/2021  EKG Time: 17:26  Rate: 90  Rhythm: normal EKG, normal sinus rhythm, unchanged from previous tracings  Axis: normal  Intervals: normal   ST&T Change: no st change    RADIOLOGY  I personally reviewed all radiographic images ordered to evaluate for the above acute complaints and reviewed radiology reports and findings.  These findings were personally discussed with the patient.  Please see medical record for radiology report.  ____________________________________________   PROCEDURES  Procedure(s) performed:  Procedures    Critical Care performed: no ____________________________________________   INITIAL  IMPRESSION / ASSESSMENT AND PLAN / ED COURSE  Pertinent labs & imaging results that were available during my care of the patient were reviewed by me and considered in my medical decision making (see chart for details).   DDX: migraine, tension, mass, cluster, status migrainosus  JAUNITA SZCZEPANIAK is a 50 y.o. who presents to the ED with sensation as described above.  Given duration of symptoms extensive migrainous history seems consistent with status migrainosus.  Patient given IV fluids as well as IV droperidol.  EKG with normal intervals.  Patient with significant improvement after medication.  CT imaging ordered at triage does not show any evidence of acute abnormality.  She does not have any focal neurodeficits.  Not meningitic.  Not consistent with SAH.  Patient is stable and appropriate for outpatient follow-up.     The patient was evaluated in Emergency Department today for the symptoms described in the history of present illness. He/she was evaluated in the context of the global COVID-19 pandemic, which necessitated consideration that the patient might be at risk for infection with the SARS-CoV-2 virus that causes COVID-19. Institutional protocols and algorithms that pertain to the evaluation of patients at risk for COVID-19 are  in a state of rapid change based on information released by regulatory bodies including the CDC and federal and state organizations. These policies and algorithms were followed during the patient's care in the ED.  As part of my medical decision making, I reviewed the following data within the [place] notes reviewed and incorporated, Labs reviewed, notes from prior ED visits and [place] Controlled Substance Database   ____________________________________________   FINAL CLINICAL IMPRESSION(S) / ED DIAGNOSES  Final diagnoses:  Status migrainosus      NEW MEDICATIONS STARTED DURING THIS VISIT:  Discharge Medication List as of 07/20/2021   6:50 PM       Note:  This document was prepared using Dragon voice recognition software and may include unintentional dictation errors.    Merlyn Lot, MD 07/20/21 [phone]

## 2021-07-22 NOTE — Telephone Encounter (Signed)
Yes. Thanks 

## 2021-07-24 NOTE — Telephone Encounter (Signed)
Called to speak with pt to get more information. No answer left VM to call back.

## 2021-07-25 ENCOUNTER — Encounter: Payer: Self-pay | Admitting: Emergency Medicine

## 2021-07-25 ENCOUNTER — Ambulatory Visit: Payer: BC Managed Care – PPO | Admitting: Emergency Medicine

## 2021-07-25 ENCOUNTER — Other Ambulatory Visit: Payer: Self-pay

## 2021-07-25 VITALS — BP 134/80 | HR 98 | Temp 98.3°F | Ht 65.0 in | Wt 204.0 lb

## 2021-07-25 DIAGNOSIS — G43719 Chronic migraine without aura, intractable, without status migrainosus: Secondary | ICD-10-CM | POA: Diagnosis not present

## 2021-07-25 NOTE — Telephone Encounter (Signed)
Pt had OV today, FMLA extended.

## 2021-07-25 NOTE — Progress Notes (Signed)
Amy Charles 50 y.o.   Chief Complaint  Patient presents with   Hospitalization Follow-up    migrianes    HISTORY OF PRESENT ILLNESS: This is a 50 y.o. female seen in the emergency department on 07/20/2021 with status migrainosus.  Here for follow-up. Doing better.  Presently taking Nurtec and Aimovig, CGRP receptor antagonist, monthly shot.  Follows up with [facility].  HPI   Prior to Admission medications   Medication Sig Start Date End Date Taking? Authorizing Provider  ALPRAZolam Duanne Moron) 0.5 MG tablet Take 1 tablet (0.5 mg total) by mouth 2 (two) times daily as needed for anxiety. 05/09/21   Horald Pollen, MD  aspirin 81 MG EC tablet Take by mouth. 10/03/20 10/03/21  [provider]  baclofen (LIORESAL) 10 MG tablet TAKE 0.5-1 TABLETS BY MOUTH 3 TIMES DAILY AS NEEDED FOR MUSCLE SPASMS. 07/20/21   Hilts, Legrand Como, MD  celecoxib (CELEBREX) 200 MG capsule Take 1 capsule (200 mg total) by mouth 2 (two) times daily as needed. 03/02/21   Hilts, Legrand Como, MD  clopidogrel (PLAVIX) 75 MG tablet Take by mouth. 10/04/20 10/04/21  [provider]  escitalopram (LEXAPRO) 20 MG tablet Take 1 tablet (20 mg total) by mouth daily. 06/14/21 09/12/21  Horald Pollen, MD  levocetirizine (XYZAL) 5 MG tablet Take 1 tablet (5 mg total) by mouth every evening. 06/19/18   Jaynee Eagles, PA-C  lisinopril (ZESTRIL) 40 MG tablet Take 1 tablet (40 mg total) by mouth daily. 10/12/20   Horald Pollen, MD  naratriptan [facility]) 2.5 MG tablet  02/15/21   [provider]  Rimegepant Sulfate (NURTEC) 75 MG TBDP Take by mouth.    [provider]    Allergies  Allergen Reactions   Topiramate     Other reaction(s): Syncope   Augmentin [Amoxicillin-Pot Clavulanate] Nausea And Vomiting   Sumatriptan     convulsions   Tetracyclines & Related     Patient Active Problem List   Diagnosis Date Noted   Panic disorder 06/14/2021   Internal carotid artery  dissection ([place]) 02/08/2021   Intractable chronic migraine without aura and without status migrainosus 07/20/2020   Chronic diarrhea 07/18/2018   Situational anxiety 07/18/2018   Essential hypertension 05/14/2018   Generalized anxiety disorder 05/14/2018   Chronic daily headache 05/18/2012    Past Medical History:  Diagnosis Date   Allergy    Anxiety    Cancer ([place])    Depression    Gallstones    GERD (gastroesophageal reflux disease)    IBS (irritable bowel syndrome)    Migraines     Past Surgical History:  Procedure Laterality Date   BREAST BIOPSY     CHOLECYSTECTOMY N/A 12/25/2017   Procedure: LAPAROSCOPIC CHOLECYSTECTOMY;  Surgeon: Olean Ree, MD;  Location: [facility] ORS;  Service: General;  Laterality: N/A;   NOVASURE ABLATION     TUBAL LIGATION      Social History   Socioeconomic History   Marital status: Married    Spouse name: Not on file   Number of children: Not on file   Years of education: Not on file   Highest education level: Not on file  Occupational History   Not on file  Tobacco Use   Smoking status: Every Day    Packs/day: 0.50    Years: 30.00    Pack years: 15.00    Types: Cigarettes   Smokeless tobacco: Never   Tobacco comments:    off/on  Substance and Sexual Activity  Alcohol use: No   Drug use: No   Sexual activity: Not on file  Other Topics Concern   Not on file  Social History Narrative   Not on file   Social Determinants of Health   Financial Resource Strain: Not on file  Food Insecurity: Not on file  Transportation Needs: Not on file  Physical Activity: Not on file  Stress: Not on file  Social Connections: Not on file  Intimate Partner Violence: Not on file    Family History  Problem Relation Age of Onset   Diabetes Mother    Heart disease Mother    Hyperlipidemia Mother    Hypertension Mother    Heart disease Father    Hyperlipidemia Father    Hypertension Father    Cancer Father    Hyperlipidemia Brother     Hypertension Brother    Hyperlipidemia Brother    Hypertension Brother      Review of Systems  Constitutional: Negative.  Negative for chills and fever.  HENT: Negative.  Negative for congestion and sore throat.   Respiratory: Negative.  Negative for cough and shortness of breath.   Cardiovascular:  Negative for chest pain and palpitations.  Gastrointestinal:  Negative for abdominal pain, nausea and vomiting.  Skin: Negative.   Neurological:  Positive for headaches. Negative for dizziness.  All other systems reviewed and are negative.   Physical Exam Vitals reviewed.  Constitutional:      Appearance: Normal appearance.  HENT:     Head: Normocephalic.  Eyes:     Extraocular Movements: Extraocular movements intact.     Pupils: Pupils are equal, round, and reactive to light.  Cardiovascular:     Rate and Rhythm: Normal rate.  Pulmonary:     Effort: Pulmonary effort is normal.  Musculoskeletal:        General: Normal range of motion.     Cervical back: Normal range of motion.  Skin:    General: Skin is warm and dry.     Capillary Refill: Capillary refill takes less than 2 seconds.  Neurological:     General: No focal deficit present.     Mental Status: She is alert and oriented to person, place, and time.  Psychiatric:        Mood and Affect: Mood normal.        Behavior: Behavior normal.     ASSESSMENT & PLAN: Amy Charles was seen today for hospitalization follow-up.  Diagnoses and all orders for this visit:  Intractable chronic migraine without aura and without status migrainosus  Clinically stable.  No red flag signs or symptoms.  Doing better today. Has follow-up appointment with neurologist next week. Continue present medications.  No changes.  Patient Instructions  Migraine Headache A migraine headache is a very strong throbbing pain on one side or both sides of your head. This type of headache can also cause other symptoms. It can last from 4 hours to 3 days.  Talk with your doctor about what things may bring on (trigger) this condition. What are the causes? The exact cause of this condition is not known. This condition may be triggered or caused by: Drinking alcohol. Smoking. Taking medicines, such as: Medicine used to treat chest pain (nitroglycerin). Birth control pills. Estrogen. Some blood pressure medicines. Eating or drinking certain products. Doing physical activity. Other things that may trigger a migraine headache include: Having a menstrual period. Pregnancy. Hunger. Stress. Not getting enough sleep or getting too much sleep. Weather changes. Tiredness (fatigue).  What increases the risk? Being 2-67 years old. Being female. Having a family history of migraine headaches. Being Caucasian. Having depression or anxiety. Being very overweight. What are the signs or symptoms? A throbbing pain. This pain may: Happen in any area of the head, such as on one side or both sides. Make it hard to do daily activities. Get worse with physical activity. Get worse around bright lights or loud noises. Other symptoms may include: Feeling sick to your stomach (nauseous). Vomiting. Dizziness. Being sensitive to bright lights, loud noises, or smells. Before you get a migraine headache, you may get warning signs (an aura). An aura may include: Seeing flashing lights or having blind spots. Seeing bright spots, halos, or zigzag lines. Having tunnel vision or blurred vision. Having numbness or a tingling feeling. Having trouble talking. Having weak muscles. Some people have symptoms after a migraine headache (postdromal phase), such as: Tiredness. Trouble thinking (concentrating). How is this treated? Taking medicines that: Relieve pain. Relieve the feeling of being sick to your stomach. Prevent migraine headaches. Treatment may also include: Having acupuncture. Avoiding foods that bring on migraine headaches. Learning ways to  control your body functions (biofeedback). Therapy to help you know and deal with negative thoughts (cognitive behavioral therapy). Follow these instructions at home: Medicines Take over-the-counter and prescription medicines only as told by your doctor. Ask your doctor if the medicine prescribed to you: Requires you to avoid driving or using heavy machinery. Can cause trouble pooping (constipation). You may need to take these steps to prevent or treat trouble pooping: Drink enough fluid to keep your pee (urine) pale yellow. Take over-the-counter or prescription medicines. Eat foods that are high in fiber. These include beans, whole grains, and fresh fruits and vegetables. Limit foods that are high in fat and sugar. These include fried or sweet foods. Lifestyle Do not drink alcohol. Do not use any products that contain nicotine or tobacco, such as cigarettes, e-cigarettes, and chewing tobacco. If you need help quitting, ask your doctor. Get at least 8 hours of sleep every night. Limit and deal with stress. General instructions     Keep a journal to find out what may bring on your migraine headaches. For example, write down: What you eat and drink. How much sleep you get. Any change in what you eat or drink. Any change in your medicines. If you have a migraine headache: Avoid things that make your symptoms worse, such as bright lights. It may help to lie down in a dark, quiet room. Do not drive or use heavy machinery. Ask your doctor what activities are safe for you. Keep all follow-up visits as told by your doctor. This is important. Contact a doctor if: You get a migraine headache that is different or worse than others you have had. You have more than 15 headache days in one month. Get help right away if: Your migraine headache gets very bad. Your migraine headache lasts longer than 72 hours. You have a fever. You have a stiff neck. You have trouble seeing. Your muscles feel  weak or like you cannot control them. You start to lose your balance a lot. You start to have trouble walking. You pass out (faint). You have a seizure. Summary A migraine headache is a very strong throbbing pain on one side or both sides of your head. These headaches can also cause other symptoms. This condition may be treated with medicines and changes to your lifestyle. Keep a journal to find out what  may bring on your migraine headaches. Contact a doctor if you get a migraine headache that is different or worse than others you have had. Contact your doctor if you have more than 15 headache days in a month. This information is not intended to replace advice given to you by your health care provider. Make sure you discuss any questions you have with your healthcare provider. Document Revised: 03/06/2019 Document Reviewed: 12/25/2018 Elsevier Patient Education  2022 [place], MD [facility]

## 2021-07-25 NOTE — Patient Instructions (Signed)
Migraine Headache A migraine headache is a very strong throbbing pain on one side or both sides of your head. This type of headache can also cause other symptoms. It can last from 4 hours to 3 days. Talk with your doctor about what things may bring on (trigger) this condition. What are the causes? The exact cause of this condition is not known. This condition may be triggered or caused by: Drinking alcohol. Smoking. Taking medicines, such as: Medicine used to treat chest pain (nitroglycerin). Birth control pills. Estrogen. Some blood pressure medicines. Eating or drinking certain products. Doing physical activity. Other things that may trigger a migraine headache include: Having a menstrual period. Pregnancy. Hunger. Stress. Not getting enough sleep or getting too much sleep. Weather changes. Tiredness (fatigue). What increases the risk? Being 25-55 years old. Being female. Having a family history of migraine headaches. Being Caucasian. Having depression or anxiety. Being very overweight. What are the signs or symptoms? A throbbing pain. This pain may: Happen in any area of the head, such as on one side or both sides. Make it hard to do daily activities. Get worse with physical activity. Get worse around bright lights or loud noises. Other symptoms may include: Feeling sick to your stomach (nauseous). Vomiting. Dizziness. Being sensitive to bright lights, loud noises, or smells. Before you get a migraine headache, you may get warning signs (an aura). An aura may include: Seeing flashing lights or having blind spots. Seeing bright spots, halos, or zigzag lines. Having tunnel vision or blurred vision. Having numbness or a tingling feeling. Having trouble talking. Having weak muscles. Some people have symptoms after a migraine headache (postdromal phase), such as: Tiredness. Trouble thinking (concentrating). How is this treated? Taking medicines that: Relieve  pain. Relieve the feeling of being sick to your stomach. Prevent migraine headaches. Treatment may also include: Having acupuncture. Avoiding foods that bring on migraine headaches. Learning ways to control your body functions (biofeedback). Therapy to help you know and deal with negative thoughts (cognitive behavioral therapy). Follow these instructions at home: Medicines Take over-the-counter and prescription medicines only as told by your doctor. Ask your doctor if the medicine prescribed to you: Requires you to avoid driving or using heavy machinery. Can cause trouble pooping (constipation). You may need to take these steps to prevent or treat trouble pooping: Drink enough fluid to keep your pee (urine) pale yellow. Take over-the-counter or prescription medicines. Eat foods that are high in fiber. These include beans, whole grains, and fresh fruits and vegetables. Limit foods that are high in fat and sugar. These include fried or sweet foods. Lifestyle Do not drink alcohol. Do not use any products that contain nicotine or tobacco, such as cigarettes, e-cigarettes, and chewing tobacco. If you need help quitting, ask your doctor. Get at least 8 hours of sleep every night. Limit and deal with stress. General instructions   Keep a journal to find out what may bring on your migraine headaches. For example, write down: What you eat and drink. How much sleep you get. Any change in what you eat or drink. Any change in your medicines. If you have a migraine headache: Avoid things that make your symptoms worse, such as bright lights. It may help to lie down in a dark, quiet room. Do not drive or use heavy machinery. Ask your doctor what activities are safe for you. Keep all follow-up visits as told by your doctor. This is important. Contact a doctor if: You get a migraine headache that   is different or worse than others you have had. You have more than 15 headache days in one  month. Get help right away if: Your migraine headache gets very bad. Your migraine headache lasts longer than 72 hours. You have a fever. You have a stiff neck. You have trouble seeing. Your muscles feel weak or like you cannot control them. You start to lose your balance a lot. You start to have trouble walking. You pass out (faint). You have a seizure. Summary A migraine headache is a very strong throbbing pain on one side or both sides of your head. These headaches can also cause other symptoms. This condition may be treated with medicines and changes to your lifestyle. Keep a journal to find out what may bring on your migraine headaches. Contact a doctor if you get a migraine headache that is different or worse than others you have had. Contact your doctor if you have more than 15 headache days in a month. This information is not intended to replace advice given to you by your health care provider. Make sure you discuss any questions you have with your health care provider. Document Revised: 03/06/2019 Document Reviewed: 12/25/2018 Elsevier Patient Education  2022 [employer]  

## 2021-08-03 DIAGNOSIS — R51 Headache with orthostatic component, not elsewhere classified: Secondary | ICD-10-CM | POA: Diagnosis not present

## 2021-08-03 DIAGNOSIS — G43719 Chronic migraine without aura, intractable, without status migrainosus: Secondary | ICD-10-CM | POA: Diagnosis not present

## 2021-08-03 DIAGNOSIS — M542 Cervicalgia: Secondary | ICD-10-CM | POA: Diagnosis not present

## 2021-08-11 DIAGNOSIS — G43719 Chronic migraine without aura, intractable, without status migrainosus: Secondary | ICD-10-CM | POA: Diagnosis not present

## 2021-08-14 ENCOUNTER — Encounter: Payer: Self-pay | Admitting: Emergency Medicine

## 2021-08-15 NOTE — Telephone Encounter (Signed)
Yes that is okay.  Thanks.

## 2021-08-21 NOTE — Telephone Encounter (Signed)
Forms faxed, conformation received.

## 2021-08-23 ENCOUNTER — Encounter: Payer: Self-pay | Admitting: Emergency Medicine

## 2021-08-23 ENCOUNTER — Other Ambulatory Visit: Payer: Self-pay | Admitting: Emergency Medicine

## 2021-08-23 MED ORDER — VARENICLINE TARTRATE 0.5 MG X 11 & 1 MG X 42 PO MISC: ORAL | 0 refills | Status: DC

## 2021-08-27 DIAGNOSIS — M542 Cervicalgia: Secondary | ICD-10-CM | POA: Diagnosis not present

## 2021-08-27 DIAGNOSIS — R519 Headache, unspecified: Secondary | ICD-10-CM | POA: Diagnosis not present

## 2021-08-27 DIAGNOSIS — M47812 Spondylosis without myelopathy or radiculopathy, cervical region: Secondary | ICD-10-CM | POA: Diagnosis not present

## 2021-08-27 DIAGNOSIS — G43719 Chronic migraine without aura, intractable, without status migrainosus: Secondary | ICD-10-CM | POA: Diagnosis not present

## 2021-08-31 DIAGNOSIS — M542 Cervicalgia: Secondary | ICD-10-CM | POA: Diagnosis not present

## 2021-08-31 DIAGNOSIS — G43719 Chronic migraine without aura, intractable, without status migrainosus: Secondary | ICD-10-CM | POA: Diagnosis not present

## 2021-09-13 ENCOUNTER — Encounter: Payer: Self-pay | Admitting: Emergency Medicine

## 2021-09-13 NOTE — Telephone Encounter (Signed)
OK to extend

## 2021-09-20 ENCOUNTER — Encounter: Payer: Self-pay | Admitting: Emergency Medicine

## 2021-09-20 ENCOUNTER — Ambulatory Visit (INDEPENDENT_AMBULATORY_CARE_PROVIDER_SITE_OTHER): Payer: BC Managed Care – PPO | Admitting: Emergency Medicine

## 2021-09-20 ENCOUNTER — Other Ambulatory Visit: Payer: Self-pay

## 2021-09-20 VITALS — BP 138/78 | HR 93 | Ht 65.0 in | Wt 209.0 lb

## 2021-09-20 DIAGNOSIS — M542 Cervicalgia: Secondary | ICD-10-CM

## 2021-09-20 DIAGNOSIS — G43719 Chronic migraine without aura, intractable, without status migrainosus: Secondary | ICD-10-CM | POA: Diagnosis not present

## 2021-09-20 DIAGNOSIS — I10 Essential (primary) hypertension: Secondary | ICD-10-CM | POA: Diagnosis not present

## 2021-09-20 NOTE — Patient Instructions (Signed)
Migraine Headache A migraine headache is a very strong throbbing pain on one side or both sides of your head. This type of headache can also cause other symptoms. It can last from 4 hours to 3 days. Talk with your doctor about what things may bring on (trigger) this condition. What are the causes? The exact cause of this condition is not known. This condition may be triggered or caused by: Drinking alcohol. Smoking. Taking medicines, such as: Medicine used to treat chest pain (nitroglycerin). Birth control pills. Estrogen. Some blood pressure medicines. Eating or drinking certain products. Doing physical activity. Other things that may trigger a migraine headache include: Having a menstrual period. Pregnancy. Hunger. Stress. Not getting enough sleep or getting too much sleep. Weather changes. Tiredness (fatigue). What increases the risk? Being 25-55 years old. Being female. Having a family history of migraine headaches. Being Caucasian. Having depression or anxiety. Being very overweight. What are the signs or symptoms? A throbbing pain. This pain may: Happen in any area of the head, such as on one side or both sides. Make it hard to do daily activities. Get worse with physical activity. Get worse around bright lights or loud noises. Other symptoms may include: Feeling sick to your stomach (nauseous). Vomiting. Dizziness. Being sensitive to bright lights, loud noises, or smells. Before you get a migraine headache, you may get warning signs (an aura). An aura may include: Seeing flashing lights or having blind spots. Seeing bright spots, halos, or zigzag lines. Having tunnel vision or blurred vision. Having numbness or a tingling feeling. Having trouble talking. Having weak muscles. Some people have symptoms after a migraine headache (postdromal phase), such as: Tiredness. Trouble thinking (concentrating). How is this treated? Taking medicines that: Relieve  pain. Relieve the feeling of being sick to your stomach. Prevent migraine headaches. Treatment may also include: Having acupuncture. Avoiding foods that bring on migraine headaches. Learning ways to control your body functions (biofeedback). Therapy to help you know and deal with negative thoughts (cognitive behavioral therapy). Follow these instructions at home: Medicines Take over-the-counter and prescription medicines only as told by your doctor. Ask your doctor if the medicine prescribed to you: Requires you to avoid driving or using heavy machinery. Can cause trouble pooping (constipation). You may need to take these steps to prevent or treat trouble pooping: Drink enough fluid to keep your pee (urine) pale yellow. Take over-the-counter or prescription medicines. Eat foods that are high in fiber. These include beans, whole grains, and fresh fruits and vegetables. Limit foods that are high in fat and sugar. These include fried or sweet foods. Lifestyle Do not drink alcohol. Do not use any products that contain nicotine or tobacco, such as cigarettes, e-cigarettes, and chewing tobacco. If you need help quitting, ask your doctor. Get at least 8 hours of sleep every night. Limit and deal with stress. General instructions   Keep a journal to find out what may bring on your migraine headaches. For example, write down: What you eat and drink. How much sleep you get. Any change in what you eat or drink. Any change in your medicines. If you have a migraine headache: Avoid things that make your symptoms worse, such as bright lights. It may help to lie down in a dark, quiet room. Do not drive or use heavy machinery. Ask your doctor what activities are safe for you. Keep all follow-up visits as told by your doctor. This is important. Contact a doctor if: You get a migraine headache that   is different or worse than others you have had. You have more than 15 headache days in one  month. Get help right away if: Your migraine headache gets very bad. Your migraine headache lasts longer than 72 hours. You have a fever. You have a stiff neck. You have trouble seeing. Your muscles feel weak or like you cannot control them. You start to lose your balance a lot. You start to have trouble walking. You pass out (faint). You have a seizure. Summary A migraine headache is a very strong throbbing pain on one side or both sides of your head. These headaches can also cause other symptoms. This condition may be treated with medicines and changes to your lifestyle. Keep a journal to find out what may bring on your migraine headaches. Contact a doctor if you get a migraine headache that is different or worse than others you have had. Contact your doctor if you have more than 15 headache days in a month. This information is not intended to replace advice given to you by your health care provider. Make sure you discuss any questions you have with your health care provider. Document Revised: 03/06/2019 Document Reviewed: 12/25/2018 Elsevier Patient Education  2022 [employer]  

## 2021-09-20 NOTE — Assessment & Plan Note (Signed)
Has not taken lisinopril medication this morning. Continue lisinopril 40 mg daily.

## 2021-09-20 NOTE — Progress Notes (Signed)
Amy Charles 49 y.o.   Chief Complaint  Patient presents with   Headache    F/u    HISTORY OF PRESENT ILLNESS: This is a 50 y.o. female with history of chronic migraine headaches here for follow-up. Patient sees [facility] neurological group on a regular basis. Recent MRI of brain and cervical spine reviewed and discussed with patient.  Normal brain MRI and degenerative cervical spine changes.  States she had lumbar puncture recently.  Release of fluid made headache worse.  Infusion of fluid made it much better.  Patient was asymptomatic for a full 2 to 3 days. 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  Has scheduled follow-up appointment next month. No other complaints or medical concerns today  Headache  Pertinent negatives include no abdominal pain, blurred vision, coughing, eye pain, fever, nausea, sore throat or vomiting.    Prior to Admission medications   Medication Sig Start Date End Date Taking? Authorizing Provider  ALPRAZolam Duanne Moron) 0.5 MG tablet Take 1 tablet (0.5 mg total) by mouth 2 (two) times daily as needed for anxiety. 05/09/21  Yes Horald Pollen, MD  aspirin 81 MG EC tablet Take by mouth. 10/03/20 10/03/21 Yes [provider]  baclofen (LIORESAL) 10 MG tablet TAKE 0.5-1 TABLETS BY MOUTH 3 TIMES DAILY AS NEEDED FOR MUSCLE SPASMS. 07/20/21  Yes Hilts, Legrand Como, MD  celecoxib (CELEBREX) 200 MG capsule Take 1 capsule (200 mg total) by mouth 2 (two) times daily as needed. 03/02/21  Yes Hilts, Legrand Como, MD  clopidogrel (PLAVIX) 75 MG tablet Take by mouth. 10/04/20 10/04/21 Yes [provider]  levocetirizine (XYZAL) 5 MG tablet Take 1 tablet (5 mg total) by mouth every evening. 06/19/18  Yes Jaynee Eagles, PA-C  lisinopril (ZESTRIL) 40 MG tablet Take 1 tablet (40 mg total) by mouth daily. 10/12/20  Yes Horald Pollen, MD  naratriptan [facility]) 2.5 MG tablet  02/15/21  Yes [provider]  Rimegepant Sulfate  (NURTEC) 75 MG TBDP Take by mouth.   Yes [provider]  varenicline (CHANTIX STARTING MONTH PAK) 0.5 MG X 11 & 1 MG X 42 tablet Take one 0.5 mg tablet by mouth once daily for 3 days, then increase to one 0.5 mg tablet twice daily for 4 days, then increase to one 1 mg tablet twice daily. 08/23/21  Yes Earsie Humm, Ines Bloomer, MD  escitalopram (LEXAPRO) 20 MG tablet Take 1 tablet (20 mg total) by mouth daily. 06/14/21 09/12/21  Horald Pollen, MD    Allergies  Allergen Reactions   Topiramate     Other reaction(s): Syncope   Augmentin [Amoxicillin-Pot Clavulanate] Nausea And Vomiting   Sumatriptan     convulsions   Tetracyclines & Related     Patient Active Problem List   Diagnosis Date Noted   Panic disorder 06/14/2021   Internal carotid artery dissection ([place]) 02/08/2021   Intractable chronic migraine without aura and without status migrainosus 07/20/2020   Chronic diarrhea 07/18/2018   Situational anxiety 07/18/2018   Essential hypertension 05/14/2018   Generalized anxiety disorder 05/14/2018   Chronic daily headache 05/18/2012    Past Medical History:  Diagnosis Date   Allergy    Anxiety    Cancer ([place])    Depression    Gallstones    GERD (gastroesophageal reflux disease)    IBS (irritable bowel syndrome)    Migraines     Past Surgical History:  Procedure Laterality Date   BREAST BIOPSY     CHOLECYSTECTOMY  N/A 12/25/2017   Procedure: LAPAROSCOPIC CHOLECYSTECTOMY;  Surgeon: Olean Ree, MD;  Location: [facility] ORS;  Service: General;  Laterality: N/A;   NOVASURE ABLATION     TUBAL LIGATION      Social History   Socioeconomic History   Marital status: Married    Spouse name: Not on file   Number of children: Not on file   Years of education: Not on file   Highest education level: Not on file  Occupational History   Not on file  Tobacco Use   Smoking status: Every Day    Packs/day: 0.50    Years: 30.00    Pack years: 15.00    Types: Cigarettes    Smokeless tobacco: Never   Tobacco comments:    off/on  Substance and Sexual Activity   Alcohol use: No   Drug use: No   Sexual activity: Not on file  Other Topics Concern   Not on file  Social History Narrative   Not on file   Social Determinants of Health   Financial Resource Strain: Not on file  Food Insecurity: Not on file  Transportation Needs: Not on file  Physical Activity: Not on file  Stress: Not on file  Social Connections: Not on file  Intimate Partner Violence: Not on file    Family History  Problem Relation Age of Onset   Diabetes Mother    Heart disease Mother    Hyperlipidemia Mother    Hypertension Mother    Heart disease Father    Hyperlipidemia Father    Hypertension Father    Cancer Father    Hyperlipidemia Brother    Hypertension Brother    Hyperlipidemia Brother    Hypertension Brother      Review of Systems  Constitutional: Negative.  Negative for chills and fever.  HENT: Negative.  Negative for congestion and sore throat.   Eyes:  Negative for blurred vision, double vision and pain.  Respiratory: Negative.  Negative for cough and shortness of breath.   Cardiovascular: Negative.  Negative for chest pain and palpitations.  Gastrointestinal:  Negative for abdominal pain, nausea and vomiting.  Skin: Negative.  Negative for rash.  Neurological:  Positive for headaches.  All other systems reviewed and are negative.   Physical Exam Vitals reviewed.  Constitutional:      Appearance: She is well-developed.  HENT:     Head: Normocephalic.     Mouth/Throat:     Mouth: Mucous membranes are moist.     Pharynx: Oropharynx is clear.  Eyes:     Extraocular Movements: Extraocular movements intact.     Pupils: Pupils are equal, round, and reactive to light.  Cardiovascular:     Rate and Rhythm: Normal rate and regular rhythm.     Pulses: Normal pulses.     Heart sounds: Normal heart sounds.  Pulmonary:     Effort: Pulmonary effort is  normal.     Breath sounds: Normal breath sounds.  Musculoskeletal:     Cervical back: Normal range of motion and neck supple.  Skin:    General: Skin is warm and dry.     Capillary Refill: Capillary refill takes less than 2 seconds.  Neurological:     General: No focal deficit present.     Mental Status: She is alert and oriented to person, place, and time.  Psychiatric:        Mood and Affect: Mood normal.        Behavior: Behavior normal.  ASSESSMENT & PLAN: Problem List Items Addressed This Visit       Cardiovascular and Mediastinum   Essential hypertension    Has not taken lisinopril medication this morning. Continue lisinopril 40 mg daily.      Intractable chronic migraine without aura and without status migrainosus - Primary    Presently under care by [facility].  Suboptimally controlled.        Other   Cervicalgia   Patient Instructions  Migraine Headache A migraine headache is a very strong throbbing pain on one side or both sides of your head. This type of headache can also cause other symptoms. It can last from 4 hours to 3 days. Talk with your doctor about what things may bring on (trigger) this condition. What are the causes? The exact cause of this condition is not known. This condition may be triggered or caused by: Drinking alcohol. Smoking. Taking medicines, such as: Medicine used to treat chest pain (nitroglycerin). Birth control pills. Estrogen. Some blood pressure medicines. Eating or drinking certain products. Doing physical activity. Other things that may trigger a migraine headache include: Having a menstrual period. Pregnancy. Hunger. Stress. Not getting enough sleep or getting too much sleep. Weather changes. Tiredness (fatigue). What increases the risk? Being 60-56 years old. Being female. Having a family history of migraine headaches. Being Caucasian. Having depression or anxiety. Being very overweight. What are  the signs or symptoms? A throbbing pain. This pain may: Happen in any area of the head, such as on one side or both sides. Make it hard to do daily activities. Get worse with physical activity. Get worse around bright lights or loud noises. Other symptoms may include: Feeling sick to your stomach (nauseous). Vomiting. Dizziness. Being sensitive to bright lights, loud noises, or smells. Before you get a migraine headache, you may get warning signs (an aura). An aura may include: Seeing flashing lights or having blind spots. Seeing bright spots, halos, or zigzag lines. Having tunnel vision or blurred vision. Having numbness or a tingling feeling. Having trouble talking. Having weak muscles. Some people have symptoms after a migraine headache (postdromal phase), such as: Tiredness. Trouble thinking (concentrating). How is this treated? Taking medicines that: Relieve pain. Relieve the feeling of being sick to your stomach. Prevent migraine headaches. Treatment may also include: Having acupuncture. Avoiding foods that bring on migraine headaches. Learning ways to control your body functions (biofeedback). Therapy to help you know and deal with negative thoughts (cognitive behavioral therapy). Follow these instructions at home: Medicines Take over-the-counter and prescription medicines only as told by your doctor. Ask your doctor if the medicine prescribed to you: Requires you to avoid driving or using heavy machinery. Can cause trouble pooping (constipation). You may need to take these steps to prevent or treat trouble pooping: Drink enough fluid to keep your pee (urine) pale yellow. Take over-the-counter or prescription medicines. Eat foods that are high in fiber. These include beans, whole grains, and fresh fruits and vegetables. Limit foods that are high in fat and sugar. These include fried or sweet foods. Lifestyle Do not drink alcohol. Do not use any products that contain  nicotine or tobacco, such as cigarettes, e-cigarettes, and chewing tobacco. If you need help quitting, ask your doctor. Get at least 8 hours of sleep every night. Limit and deal with stress. General instructions   Keep a journal to find out what may bring on your migraine headaches. For example, write down: What you eat and drink. How  much sleep you get. Any change in what you eat or drink. Any change in your medicines. If you have a migraine headache: Avoid things that make your symptoms worse, such as bright lights. It may help to lie down in a dark, quiet room. Do not drive or use heavy machinery. Ask your doctor what activities are safe for you. Keep all follow-up visits as told by your doctor. This is important. Contact a doctor if: You get a migraine headache that is different or worse than others you have had. You have more than 15 headache days in one month. Get help right away if: Your migraine headache gets very bad. Your migraine headache lasts longer than 72 hours. You have a fever. You have a stiff neck. You have trouble seeing. Your muscles feel weak or like you cannot control them. You start to lose your balance a lot. You start to have trouble walking. You pass out (faint). You have a seizure. Summary A migraine headache is a very strong throbbing pain on one side or both sides of your head. These headaches can also cause other symptoms. This condition may be treated with medicines and changes to your lifestyle. Keep a journal to find out what may bring on your migraine headaches. Contact a doctor if you get a migraine headache that is different or worse than others you have had. Contact your doctor if you have more than 15 headache days in a month. This information is not intended to replace advice given to you by your health care provider. Make sure you discuss any questions you have with your health care provider. Document Revised: 03/06/2019 Document Reviewed:  12/25/2018 Elsevier Patient Education  2022 [place], MD [facility]

## 2021-09-20 NOTE — Assessment & Plan Note (Signed)
Presently under care by [facility].  Suboptimally controlled.

## 2021-10-11 ENCOUNTER — Other Ambulatory Visit: Payer: Self-pay | Admitting: Emergency Medicine

## 2021-10-26 DIAGNOSIS — I7771 Dissection of carotid artery: Secondary | ICD-10-CM | POA: Diagnosis not present

## 2021-10-27 DIAGNOSIS — I7771 Dissection of carotid artery: Secondary | ICD-10-CM | POA: Diagnosis not present

## 2021-10-30 ENCOUNTER — Other Ambulatory Visit: Payer: Self-pay | Admitting: Emergency Medicine

## 2021-10-30 DIAGNOSIS — I10 Essential (primary) hypertension: Secondary | ICD-10-CM

## 2021-11-02 DIAGNOSIS — Z8679 Personal history of other diseases of the circulatory system: Secondary | ICD-10-CM | POA: Diagnosis not present

## 2021-11-02 DIAGNOSIS — R519 Headache, unspecified: Secondary | ICD-10-CM | POA: Diagnosis not present

## 2021-11-09 ENCOUNTER — Encounter: Payer: Self-pay | Admitting: Emergency Medicine

## 2021-11-15 NOTE — Telephone Encounter (Signed)
Agree. It's time she starts sending these forms to her neurologist who is the one with the most accurate up-to-date information.

## 2021-11-19 ENCOUNTER — Other Ambulatory Visit: Payer: Self-pay | Admitting: Emergency Medicine

## 2021-12-01 DIAGNOSIS — H538 Other visual disturbances: Secondary | ICD-10-CM | POA: Diagnosis not present

## 2021-12-01 DIAGNOSIS — H9319 Tinnitus, unspecified ear: Secondary | ICD-10-CM | POA: Diagnosis not present

## 2021-12-01 DIAGNOSIS — R519 Headache, unspecified: Secondary | ICD-10-CM | POA: Diagnosis not present

## 2021-12-01 DIAGNOSIS — G9600 Cerebrospinal fluid leak, unspecified: Secondary | ICD-10-CM | POA: Diagnosis not present

## 2021-12-01 DIAGNOSIS — R42 Dizziness and giddiness: Secondary | ICD-10-CM | POA: Diagnosis not present

## 2021-12-01 DIAGNOSIS — R51 Headache with orthostatic component, not elsewhere classified: Secondary | ICD-10-CM | POA: Diagnosis not present

## 2021-12-01 DIAGNOSIS — G43719 Chronic migraine without aura, intractable, without status migrainosus: Secondary | ICD-10-CM | POA: Diagnosis not present

## 2021-12-07 ENCOUNTER — Other Ambulatory Visit: Payer: Self-pay | Admitting: Emergency Medicine

## 2021-12-07 DIAGNOSIS — F418 Other specified anxiety disorders: Secondary | ICD-10-CM

## 2021-12-08 DIAGNOSIS — G43719 Chronic migraine without aura, intractable, without status migrainosus: Secondary | ICD-10-CM | POA: Diagnosis not present

## 2021-12-08 DIAGNOSIS — R51 Headache with orthostatic component, not elsewhere classified: Secondary | ICD-10-CM | POA: Diagnosis not present

## 2022-01-05 ENCOUNTER — Other Ambulatory Visit: Payer: Self-pay | Admitting: Emergency Medicine

## 2022-02-20 ENCOUNTER — Other Ambulatory Visit: Payer: Self-pay | Admitting: Emergency Medicine

## 2022-02-20 NOTE — Telephone Encounter (Signed)
Yes

## 2022-02-20 NOTE — Telephone Encounter (Signed)
Thank you :)

## 2022-03-07 ENCOUNTER — Other Ambulatory Visit: Payer: Self-pay | Admitting: Emergency Medicine

## 2022-03-07 DIAGNOSIS — I10 Essential (primary) hypertension: Secondary | ICD-10-CM

## 2022-06-10 ENCOUNTER — Other Ambulatory Visit: Payer: Self-pay

## 2022-06-10 DIAGNOSIS — R197 Diarrhea, unspecified: Secondary | ICD-10-CM | POA: Insufficient documentation

## 2022-06-10 DIAGNOSIS — R1031 Right lower quadrant pain: Secondary | ICD-10-CM | POA: Diagnosis not present

## 2022-06-10 DIAGNOSIS — G43809 Other migraine, not intractable, without status migrainosus: Secondary | ICD-10-CM | POA: Diagnosis not present

## 2022-06-10 DIAGNOSIS — R1030 Lower abdominal pain, unspecified: Secondary | ICD-10-CM | POA: Diagnosis not present

## 2022-06-10 DIAGNOSIS — Z859 Personal history of malignant neoplasm, unspecified: Secondary | ICD-10-CM | POA: Diagnosis not present

## 2022-06-10 DIAGNOSIS — Z79899 Other long term (current) drug therapy: Secondary | ICD-10-CM | POA: Insufficient documentation

## 2022-06-10 DIAGNOSIS — R1084 Generalized abdominal pain: Secondary | ICD-10-CM | POA: Insufficient documentation

## 2022-06-10 DIAGNOSIS — N281 Cyst of kidney, acquired: Secondary | ICD-10-CM | POA: Diagnosis not present

## 2022-06-10 DIAGNOSIS — I10 Essential (primary) hypertension: Secondary | ICD-10-CM | POA: Diagnosis not present

## 2022-06-10 DIAGNOSIS — R519 Headache, unspecified: Secondary | ICD-10-CM | POA: Diagnosis not present

## 2022-06-10 DIAGNOSIS — R42 Dizziness and giddiness: Secondary | ICD-10-CM | POA: Diagnosis not present

## 2022-06-10 LAB — URINALYSIS, ROUTINE W REFLEX MICROSCOPIC
Bilirubin Urine: NEGATIVE
Glucose, UA: NEGATIVE mg/dL
Ketones, ur: NEGATIVE mg/dL
Leukocytes,Ua: NEGATIVE
Nitrite: NEGATIVE
Protein, ur: NEGATIVE mg/dL
Specific Gravity, Urine: 1.02 (ref 1.005–1.030)
pH: 6 (ref 5.0–8.0)

## 2022-06-10 LAB — COMPREHENSIVE METABOLIC PANEL
ALT: 20 U/L (ref 0–44)
AST: 18 U/L (ref 15–41)
Albumin: 4.5 g/dL (ref 3.5–5.0)
Alkaline Phosphatase: 65 U/L (ref 38–126)
Anion gap: 12 (ref 5–15)
BUN: 12 mg/dL (ref 6–20)
CO2: 22 mmol/L (ref 22–32)
Calcium: 9.2 mg/dL (ref 8.9–10.3)
Chloride: 107 mmol/L (ref 98–111)
Creatinine, Ser: 0.69 mg/dL (ref 0.44–1.00)
GFR, Estimated: >60 mL/min (ref >60)
Glucose, Bld: 99 mg/dL (ref 70–99)
Potassium: 3.5 mmol/L (ref 3.5–5.1)
Sodium: 141 mmol/L (ref 135–145)
Total Bilirubin: 0.4 mg/dL (ref 0.3–1.2)
Total Protein: 7.3 g/dL (ref 6.5–8.1)

## 2022-06-10 LAB — LIPASE, BLOOD: Lipase: 28 U/L (ref 11–51)

## 2022-06-10 LAB — URINALYSIS, MICROSCOPIC (REFLEX)

## 2022-06-10 LAB — CBC
HCT: 45 % (ref 36.0–46.0)
Hemoglobin: 14.7 g/dL (ref 12.0–15.0)
MCH: 29.5 pg (ref 26.0–34.0)
MCHC: 32.7 g/dL (ref 30.0–36.0)
MCV: 90.2 fL (ref 80.0–100.0)
Platelets: 265 10*3/uL (ref 150–400)
RBC: 4.99 MIL/uL (ref 3.87–5.11)
RDW: 13.5 % (ref 11.5–15.5)
WBC: 7.9 10*3/uL (ref 4.0–10.5)
nRBC: 0 % (ref 0.0–0.2)

## 2022-06-10 NOTE — ED Notes (Signed)
Secure chat message sent to dr. Joni Fears to inquire about need for possible head ct. No new ct orders recevied from md.

## 2022-06-10 NOTE — ED Triage Notes (Signed)
Pt states a weeks of headache, diarrhea, abd pain and dizziness.  Pt states she does have nausea. Pt states history of migraines. Pt complains of upper abd pain that radiates to center ofa bd.

## 2022-06-11 ENCOUNTER — Emergency Department: Payer: BC Managed Care – PPO

## 2022-06-11 ENCOUNTER — Emergency Department
Admission: EM | Admit: 2022-06-11 | Discharge: 2022-06-11 | Disposition: A | Discharge status: Home / Self Care | Payer: BC Managed Care – PPO | Attending: Emergency Medicine | Admitting: Emergency Medicine

## 2022-06-11 DIAGNOSIS — G43809 Other migraine, not intractable, without status migrainosus: Secondary | ICD-10-CM

## 2022-06-11 DIAGNOSIS — R197 Diarrhea, unspecified: Secondary | ICD-10-CM

## 2022-06-11 DIAGNOSIS — N281 Cyst of kidney, acquired: Secondary | ICD-10-CM | POA: Diagnosis not present

## 2022-06-11 DIAGNOSIS — R1031 Right lower quadrant pain: Secondary | ICD-10-CM | POA: Diagnosis not present

## 2022-06-11 DIAGNOSIS — R42 Dizziness and giddiness: Secondary | ICD-10-CM | POA: Diagnosis not present

## 2022-06-11 DIAGNOSIS — R1084 Generalized abdominal pain: Secondary | ICD-10-CM

## 2022-06-11 HISTORY — DX: Multiple sclerosis: G35

## 2022-06-11 LAB — TROPONIN I (HIGH SENSITIVITY): Troponin I (High Sensitivity): 3 ng/L (ref <18)

## 2022-06-11 MED ORDER — PROCHLORPERAZINE MALEATE 10 MG PO TABS: 10.0000 mg | ORAL_TABLET | Freq: Four times a day (QID) | ORAL | 0 refills | Status: AC | PRN

## 2022-06-11 MED ORDER — ONDANSETRON HCL 4 MG/2ML IJ SOLN
4.0000 mg | Freq: Once | INTRAMUSCULAR | Status: AC
  Administered 2022-06-11: 4 mg via INTRAVENOUS
  Filled 2022-06-11: qty 2

## 2022-06-11 MED ORDER — CIPROFLOXACIN HCL 500 MG PO TABS: 500.0000 mg | ORAL_TABLET | Freq: Two times a day (BID) | ORAL | 0 refills | Status: AC

## 2022-06-11 MED ORDER — DICYCLOMINE HCL 20 MG PO TABS: 20.0000 mg | ORAL_TABLET | Freq: Four times a day (QID) | ORAL | 0 refills | Status: AC

## 2022-06-11 MED ORDER — SODIUM CHLORIDE 0.9 % IV BOLUS
1000.0000 mL | Freq: Once | INTRAVENOUS | Status: AC
  Administered 2022-06-11: 1000 mL via INTRAVENOUS

## 2022-06-11 MED ORDER — IOHEXOL 300 MG/ML  SOLN
100.0000 mL | Freq: Once | INTRAMUSCULAR | Status: AC | PRN
Start: 2022-06-11 — End: 2022-06-11
  Administered 2022-06-11: 100 mL via INTRAVENOUS

## 2022-06-11 MED ORDER — KETOROLAC TROMETHAMINE 30 MG/ML IJ SOLN
15.0000 mg | Freq: Once | INTRAMUSCULAR | Status: AC
  Administered 2022-06-11: 15 mg via INTRAVENOUS
  Filled 2022-06-11: qty 1

## 2022-06-11 NOTE — ED Notes (Signed)
Pt brought to ED rm 7 at this time, this RN now assuming care.

## 2022-06-11 NOTE — ED Notes (Signed)
ED Provider at bedside. 

## 2022-06-11 NOTE — Discharge Instructions (Signed)
You may take Compazine as needed for headache and nausea. 2.  You may take Bentyl as needed for abdominal discomfort. 3.  Take Cipro 500 mg twice daily x3 days. 4.  Eat a brat diet x3 days, then slowly advance diet as tolerated. 5.  Return to the ER for worsening symptoms, persistent vomiting, difficulty breathing or other concerns

## 2022-06-11 NOTE — ED Provider Notes (Signed)
[facility] Provider Note    Event Date/Time   First MD Initiated Contact with Patient 06/11/22 [phone]     (approximate)   History   Headache   HPI  Amy Charles is a 51 y.o. female who presents to the ED from home with a 1 week history of migraine headache, right-sided abdominal pain, diarrhea and lightheadedness associated with nausea.  History of migraines and states this feels similarly.  Endorses associated photophobia.  Denies fever, chills, cough, vomiting or dysuria.  Denies recent travel, trauma, camping or antibiotic use.     Past Medical History   Past Medical History:  Diagnosis Date  . Allergy   . Anxiety   . Cancer ([place])   . Depression   . Gallstones   . GERD (gastroesophageal reflux disease)   . IBS (irritable bowel syndrome)   . Migraines   . Multiple sclerosis [facility])      Active Problem List   Patient Active Problem List   Diagnosis Date Noted  . Cervicalgia 09/20/2021  . Panic disorder 06/14/2021  . 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 Chronic diarrhea 07/18/2018  . Situational anxiety 07/18/2018  . Essential hypertension 05/14/2018  . Generalized anxiety disorder 05/14/2018  . Chronic daily headache 05/18/2012     Past Surgical History   Past Surgical History:  Procedure Laterality Date  . BREAST BIOPSY    . CHOLECYSTECTOMY N/A 12/25/2017   Procedure: LAPAROSCOPIC CHOLECYSTECTOMY;  Surgeon: Olean Ree, MD;  Location: [facility] ORS;  Service: General;  Laterality: N/A;  . NOVASURE ABLATION    . TUBAL LIGATION       Home Medications   Prior to Admission medications   Medication Sig Start Date End Date Taking? Authorizing Provider  ALPRAZolam Duanne Moron) 0.5 MG tablet Take 1 tablet (0.5 mg total) by mouth 2 (two) times daily as needed for anxiety. 05/09/21   Horald Pollen, MD  ALPRAZolam Duanne Moron) 0.5 MG  tablet Take 1 tablet (0.5 mg total) by mouth daily as needed for anxiety. 12/08/21   Horald Pollen, MD  baclofen (LIORESAL) 10 MG tablet TAKE 0.5-1 TABLETS BY MOUTH 3 TIMES DAILY AS NEEDED FOR MUSCLE SPASMS. 07/20/21   Hilts, Legrand Como, MD  celecoxib (CELEBREX) 200 MG capsule Take 1 capsule (200 mg total) by mouth 2 (two) times daily as needed. 03/02/21   Hilts, Legrand Como, MD  escitalopram (LEXAPRO) 20 MG tablet Take 1 tablet (20 mg total) by mouth daily. 06/14/21 09/12/21  Horald Pollen, MD  levocetirizine (XYZAL) 5 MG tablet Take 1 tablet (5 mg total) by mouth every evening. 06/19/18   Jaynee Eagles, PA-C  lisinopril (ZESTRIL) 40 MG tablet TAKE 1 TABLET BY MOUTH EVERY DAY 03/07/22   Horald Pollen, MD  naratriptan [facility]) 2.5 MG tablet  02/15/21   [provider]  Rimegepant Sulfate (NURTEC) 75 MG TBDP Take by mouth.    [provider]  varenicline (CHANTIX PAK) 0.5 MG X 11 & 1 MG X 42 tablet TAKE ONE 0.5 MG TABLET BY MOUTH ONCE DAILY FOR 3 DAYS, THEN INCREASE TO ONE 0.5 MG TABLET TWICE DAILY FOR 4 DAYS, THEN INCREASE TO ONE 1 MG TABLET TWICE DAILY. 02/20/22   Horald Pollen, MD     Allergies  Topiramate, Augmentin [amoxicillin-pot clavulanate], Sumatriptan, and Tetracyclines & related   Family History   Family History  Problem Relation Age of Onset  .  Diabetes Mother   . Heart disease Mother   . Hyperlipidemia Mother   . Hypertension Mother   . Heart disease Father   . Hyperlipidemia Father   . Hypertension Father   . Cancer Father   . Hyperlipidemia Brother   . Hypertension Brother   . Hyperlipidemia Brother   . Hypertension Brother      Physical Exam  Triage Vital Signs: ED Triage Vitals  Enc Vitals Group     BP 06/10/22 2014 (!) 168/96     Pulse Rate 06/10/22 2014 96     Resp 06/10/22 2014 16     Temp 06/10/22 2014 99 F (37.2 C)     Temp Source 06/10/22 2014 Oral     SpO2 06/10/22 2014 100 %     Weight 06/10/22 2015 193 lb (87.5  kg)     Height 06/10/22 2015 '5\' 5"'$  (1.651 m)     Head Circumference --      Peak Flow --      Pain Score 06/10/22 2015 9     Pain Loc --      Pain Edu? --      Excl. in [place]? --     Updated Vital Signs: BP (!) 156/91   Pulse 80   Temp 98.4 F (36.9 C) (Oral)   Resp 18   Ht '5\' 5"'$  (1.651 m)   Wt 87.5 kg   SpO2 94%   BMI 32.12 kg/m    General: Awake, mild distress.  CV:  RRR.  Good peripheral perfusion.  Resp:  Normal effort.  CTA B. Abd:  Diffuse tenderness to palpation without rebound or guarding.  No distention.  Other:  Photophobia.  PERRL.  EOMI.  No carotid bruits.  Supple neck without meningismus.  No rash.   ED Results / Procedures / Treatments  Labs (all labs ordered are listed, but only abnormal results are displayed) Labs Reviewed  URINALYSIS, ROUTINE W REFLEX MICROSCOPIC - Abnormal; Notable for the following components:      Result Value   Hgb urine dipstick TRACE (*)    All other components within normal limits  URINALYSIS, MICROSCOPIC (REFLEX) - Abnormal; Notable for the following components:   Bacteria, UA FEW (*)    All other components within normal limits  C DIFFICILE QUICK SCREEN W PCR REFLEX    GASTROINTESTINAL PANEL BY PCR, STOOL (REPLACES STOOL CULTURE)  LIPASE, BLOOD  COMPREHENSIVE METABOLIC PANEL  CBC  TROPONIN I (HIGH SENSITIVITY)     EKG  ED ECG REPORT I, Emmanual Gauthreaux J, the attending physician, personally viewed and interpreted this ECG.   Date: 06/11/2022  EKG Time: 2017  Rate: 91  Rhythm: normal sinus rhythm  Axis: Normal  Intervals:none  ST&T Change: Nonspecific    RADIOLOGY I have independently visualized and interpreted patient's CT scans as well as noted the radiology interpretation:  CT head: No ICH  CT abdomen pelvis: Unremarkable  Official radiology report(s): CT Head Wo Contrast  Result Date: 06/11/2022 CLINICAL DATA:  Dizziness, persistent/recurrent, cardiac or vascular cause suspected EXAM: CT HEAD WITHOUT  CONTRAST TECHNIQUE: Contiguous axial images were obtained from the base of the skull through the vertex without intravenous contrast. RADIATION DOSE REDUCTION: This exam was performed according to the departmental dose-optimization program which includes automated exposure control, adjustment of the mA and/or kV according to patient size and/or use of iterative reconstruction technique. COMPARISON:  07/20/2021 FINDINGS: Brain: Normal anatomic configuration. No abnormal intra or extra-axial mass lesion or fluid collection. No  abnormal mass effect or midline shift. No evidence of acute intracranial hemorrhage or infarct. Ventricular size is normal. Cerebellum unremarkable. Vascular: Unremarkable Skull: Intact Sinuses/Orbits: Paranasal sinuses are clear. Orbits are unremarkable. Other: Mastoid air cells and middle ear cavities are clear. IMPRESSION: No acute intracranial abnormality. Electronically Signed   By: Fidela Salisbury M.D.   On: 06/11/2022 01:28   CT Abdomen Pelvis W Contrast  Result Date: 06/11/2022 CLINICAL DATA:  Right lower quadrant abdominal pain EXAM: CT ABDOMEN AND PELVIS WITH CONTRAST TECHNIQUE: Multidetector CT imaging of the abdomen and pelvis was performed using the standard protocol following bolus administration of intravenous contrast. RADIATION DOSE REDUCTION: This exam was performed according to the departmental dose-optimization program which includes automated exposure control, adjustment of the mA and/or kV according to patient size and/or use of iterative reconstruction technique. CONTRAST:  146m OMNIPAQUE IOHEXOL 300 MG/ML  SOLN COMPARISON:  None Available. FINDINGS: Lower chest: No acute abnormality. Hepatobiliary: No focal liver abnormality is seen. Status post cholecystectomy. No biliary dilatation. Pancreas: Unremarkable Spleen: Unremarkable Adrenals/Urinary Tract: The adrenal glands are unremarkable. The kidneys are normal in size and position. Simple cortical cyst noted within  the upper pole of the left kidney. No further follow-up imaging is recommended for this lesion. The kidneys are otherwise unremarkable. Bladder is unremarkable. Stomach/Bowel: Stomach is within normal limits. Appendix appears normal. No evidence of bowel wall thickening, distention, or inflammatory changes. No free intraperitoneal gas or fluid. Vascular/Lymphatic: Aortic atherosclerosis. No enlarged abdominal or pelvic lymph nodes. Reproductive: Bilateral tubal ligation clips are in place. The pelvic organs are otherwise unremarkable. Other: No abdominal wall hernia. Musculoskeletal: No acute or significant osseous findings. IMPRESSION: No acute intra-abdominal pathology identified. No radiographic explanation for the patient's reported symptoms. Aortic Atherosclerosis (ICD10-I70.0). Electronically Signed   By: AFidela SalisburyM.D.   On: 06/11/2022 01:25     PROCEDURES:  Critical Care performed: No  .1-3 Lead EKG Interpretation  Performed by: SPaulette Blanch MD Authorized by: SPaulette Blanch MD     Interpretation: normal     ECG rate:  90   ECG rate assessment: normal     Rhythm: sinus rhythm     Ectopy: none     Conduction: normal   Comments:     Patient placed on cardiac monitor to evaluate for arrhythmias    MEDICATIONS ORDERED IN ED: Medications  sodium chloride 0.9 % bolus 1,000 mL (0 mLs Intravenous Stopped 06/11/22 0201)  ketorolac (TORADOL) 30 MG/ML injection 15 mg (15 mg Intravenous Given 06/11/22 0042)  ondansetron (ZOFRAN) injection 4 mg (4 mg Intravenous Given 06/11/22 0042)  iohexol (OMNIPAQUE) 300 MG/ML solution 100 mL (100 mLs Intravenous Contrast Given 06/11/22 0103)     IMPRESSION / MDM / ASSESSMENT AND PLAN / ED COURSE  I reviewed the triage vital signs and the nursing notes.                             51year old female presenting with migraine headache, dizziness, abdominal pain, nausea and diarrhea.  States her chief concern is abdominal pain. Differential diagnosis  includes, but is not limited to, ovarian cyst, ovarian torsion, acute appendicitis, diverticulitis, urinary tract infection/pyelonephritis, endometriosis, bowel obstruction, colitis, renal colic, gastroenteritis, hernia, fibroids, endometriosis, etc. I have personally reviewed patient's records and see that she had a urgent care visit on 03/28/2022 for COVID testing.  Patient's presentation is most consistent with acute illness requiring diagnostic work-up.  The patient is  on the cardiac monitor to evaluate for evidence of arrhythmia and/or significant heart rate changes.  Laboratory results demonstrate normal WBC 7.9, normal electrolytes, negative UA and troponin.  Will obtain stool studies if patient is able to produce.  Will obtain CT head and abdomen/pelvis.  Initiate IV fluid resuscitation, IV ketorolac for pain as patient is driving, IV Zofran as needed for nausea.  Will reassess.  Clinical Course as of 06/11/22 0211  Mon Jun 11, 2022  0134 CT head, abdomen and pelvis are unremarkable.  IV fluids infusing. [JS]  0209 Patient feeling significantly better.  Will empirically treat with a short 3-day course of Cipro, prescribe Compazine for headache, Bentyl to use as needed for abdominal discomfort.  Strict return precautions given.  Patient verbalizes understanding and agrees with plan of care. [JS]    Clinical Course User Index [JS] Paulette Blanch, MD     FINAL CLINICAL IMPRESSION(S) / ED DIAGNOSES   Final diagnoses:  Other migraine without status migrainosus, not intractable  Generalized abdominal pain  Diarrhea, unspecified type     Rx / DC Orders   ED Discharge Orders     None        Note:  This document was prepared using Dragon voice recognition software and may include unintentional dictation errors.   Paulette Blanch, MD 06/11/22 [phone]

## 2022-06-12 ENCOUNTER — Encounter: Payer: Self-pay | Admitting: Emergency Medicine

## 2022-06-12 ENCOUNTER — Ambulatory Visit: Payer: BC Managed Care – PPO | Admitting: Emergency Medicine

## 2022-06-27 ENCOUNTER — Ambulatory Visit: Payer: BC Managed Care – PPO | Admitting: Emergency Medicine

## 2022-07-04 ENCOUNTER — Ambulatory Visit: Payer: BC Managed Care – PPO | Admitting: Emergency Medicine

## 2022-10-01 ENCOUNTER — Telehealth: Payer: Self-pay | Admitting: Licensed Clinical Social Worker

## 2022-10-01 NOTE — Patient Outreach (Signed)
  Care Coordination   10/01/2022 Name: Amy Charles MRN: [id number] DOB: Feb 22, 1971   Care Coordination Outreach Attempts:  An unsuccessful telephone outreach was attempted today to offer the patient information about available care coordination services as a benefit of their health plan.   Follow Up Plan:  Additional outreach attempts will be made to offer the patient care coordination information and services.   Encounter Outcome:  No Answer  Care Coordination Interventions Activated:  No   Care Coordination Interventions:  No, not indicated    Casimer Lanius, [place] [phone]

## 2022-11-27 ENCOUNTER — Telehealth: Payer: Self-pay | Admitting: Licensed Clinical Social Worker

## 2022-11-27 NOTE — Patient Outreach (Signed)
  Care Coordination   11/27/2022 Name: Amy Charles MRN: [id number] DOB: [birth year]   Care Coordination Outreach Attempts:  A second unsuccessful outreach was attempted today to offer the patient with information about available care coordination services as a benefit of their health plan.     Follow Up Plan:  Additional outreach attempts will be made to offer the patient care coordination information and services.   Encounter Outcome:  No Answer   Care Coordination Interventions:  No, not indicated    Casimer Lanius, [place] [phone]

## 2022-12-13 ENCOUNTER — Telehealth: Payer: Self-pay

## 2022-12-13 NOTE — Patient Outreach (Signed)
  Care Coordination   12/13/2022 Name: Amy Charles MRN: [id number] DOB: Apr 10, 1971   Care Coordination Outreach Attempts:  A third unsuccessful outreach was attempted today to offer the patient with information about available care coordination services as a benefit of their health plan.   Follow Up Plan:  No further outreach attempts will be made at this time. We have been unable to contact the patient to offer or enroll patient in care coordination services  Encounter Outcome:  No Answer   Care Coordination Interventions:  No, not indicated    Thea Silversmith, RN, MSN, BSN, [place] Coordinator [phone]
